# Patient Record
Sex: Male | Born: 1945 | Race: White | Hispanic: No | Marital: Married | Smoking: Former smoker
Health system: Southern US, Community
[De-identification: ages and names within clinical notes are randomized; demographics above are authoritative.]

## PROBLEM LIST (undated history)

## (undated) DIAGNOSIS — F419 Anxiety disorder, unspecified: Secondary | ICD-10-CM

## (undated) DIAGNOSIS — M1990 Unspecified osteoarthritis, unspecified site: Secondary | ICD-10-CM

## (undated) DIAGNOSIS — K219 Gastro-esophageal reflux disease without esophagitis: Secondary | ICD-10-CM

## (undated) DIAGNOSIS — E785 Hyperlipidemia, unspecified: Secondary | ICD-10-CM

## (undated) DIAGNOSIS — W19XXXA Unspecified fall, initial encounter: Secondary | ICD-10-CM

## (undated) DIAGNOSIS — I10 Essential (primary) hypertension: Secondary | ICD-10-CM

## (undated) DIAGNOSIS — R2681 Unsteadiness on feet: Secondary | ICD-10-CM

## (undated) DIAGNOSIS — G919 Hydrocephalus, unspecified: Secondary | ICD-10-CM

## (undated) DIAGNOSIS — E119 Type 2 diabetes mellitus without complications: Secondary | ICD-10-CM

## (undated) DIAGNOSIS — N400 Enlarged prostate without lower urinary tract symptoms: Secondary | ICD-10-CM

## (undated) HISTORY — PX: COLONOSCOPY: SHX174

## (undated) HISTORY — DX: Hyperlipidemia, unspecified: E78.5

## (undated) HISTORY — DX: Type 2 diabetes mellitus without complications: E11.9

## (undated) HISTORY — DX: Anxiety disorder, unspecified: F41.9

## (undated) HISTORY — DX: Benign prostatic hyperplasia without lower urinary tract symptoms: N40.0

## (undated) HISTORY — PX: OTHER SURGICAL HISTORY: SHX169

---
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
## 2004-08-03 DIAGNOSIS — E669 Obesity, unspecified: Secondary | ICD-10-CM | POA: Insufficient documentation

## 2006-12-15 ENCOUNTER — Ambulatory Visit: Payer: Self-pay | Admitting: Family Medicine

## 2007-02-25 ENCOUNTER — Ambulatory Visit: Payer: Self-pay | Admitting: Internal Medicine

## 2007-02-25 ENCOUNTER — Other Ambulatory Visit: Payer: Self-pay

## 2009-11-15 ENCOUNTER — Ambulatory Visit: Payer: Self-pay | Admitting: Internal Medicine

## 2012-06-30 ENCOUNTER — Ambulatory Visit: Payer: Self-pay | Admitting: Unknown Physician Specialty

## 2012-07-01 LAB — PATHOLOGY REPORT

## 2013-01-27 DIAGNOSIS — Z720 Tobacco use: Secondary | ICD-10-CM | POA: Insufficient documentation

## 2015-12-12 ENCOUNTER — Ambulatory Visit
Admission: EM | Admit: 2015-12-12 | Discharge: 2015-12-12 | Disposition: A | Discharge status: Home / Self Care | Payer: Medicare Other | Attending: Family Medicine | Admitting: Family Medicine

## 2015-12-12 ENCOUNTER — Encounter: Payer: Self-pay | Admitting: Emergency Medicine

## 2015-12-12 DIAGNOSIS — S30863A Insect bite (nonvenomous) of scrotum and testes, initial encounter: Secondary | ICD-10-CM

## 2015-12-12 DIAGNOSIS — W57XXXA Bitten or stung by nonvenomous insect and other nonvenomous arthropods, initial encounter: Secondary | ICD-10-CM

## 2015-12-12 DIAGNOSIS — L089 Local infection of the skin and subcutaneous tissue, unspecified: Secondary | ICD-10-CM

## 2015-12-12 DIAGNOSIS — S70362A Insect bite (nonvenomous), left thigh, initial encounter: Secondary | ICD-10-CM | POA: Diagnosis not present

## 2015-12-12 HISTORY — DX: Gastro-esophageal reflux disease without esophagitis: K21.9

## 2015-12-12 MED ORDER — MUPIROCIN 2 % EX OINT: 1.0000 "application " | TOPICAL_OINTMENT | Freq: Three times a day (TID) | CUTANEOUS | Status: DC

## 2015-12-12 MED ORDER — SULFAMETHOXAZOLE-TRIMETHOPRIM 800-160 MG PO TABS: 1.0000 | ORAL_TABLET | Freq: Two times a day (BID) | ORAL | Status: DC

## 2015-12-12 NOTE — ED Notes (Signed)
Pt presents with possible insect bite to left side groin. Pt reports redness and swelling.

## 2015-12-12 NOTE — Discharge Instructions (Signed)
Cellulitis Cellulitis is an infection of the skin and the tissue under the skin. The infected area is usually red and tender. This happens most often in the arms and lower legs. HOME CARE   Take your antibiotic medicine as told. Finish the medicine even if you start to feel better.  Keep the infected arm or leg raised (elevated).  Put a warm cloth on the area up to 4 times per day.  Only take medicines as told by your doctor.  Keep all doctor visits as told. GET HELP IF:  You see red streaks on the skin coming from the infected area.  Your red area gets bigger or turns a dark color.  Your bone or joint under the infected area is painful after the skin heals.  Your infection comes back in the same area or different area.  You have a puffy (swollen) bump in the infected area.  You have new symptoms.  You have a fever. GET HELP RIGHT AWAY IF:   You feel very sleepy.  You throw up (vomit) or have watery poop (diarrhea).  You feel sick and have muscle aches and pains.   This information is not intended to replace advice given to you by your health care provider. Make sure you discuss any questions you have with your health care provider.   Document Released: 11/20/2007 Document Revised: 02/22/2015 Document Reviewed: 08/19/2011 Elsevier Interactive Patient Education 2016 [employer], flies, fleas, bedbugs, and other insects can bite. Insect bites are different from insect stings. The bite may be red, puffy (swollen), and itchy for 2 to 4 days. Most bites get better on their own. HOME CARE   Do not scratch the bite.  Keep the bite clean and dry. Wash the bite with soap and water every day, as told by your doctor.  If directed, apply ice to the bite area.  Put ice in a plastic bag.  Place a towel between your skin and the bag.  Leave the ice on for 20 minutes, 2-3 times per day.  Follow instructions from your doctor about using medicated  lotions or creams. These can help with itching.  Apply or take over-the-counter and prescription medicines only as told by your doctor.  If you were given an antibiotic medicine, use it as told by your doctor. Do not stop using the medicine even if your condition improves.  Keep all follow-up visits as told by your doctor. This is important. GET HELP IF:  You have redness, swelling (inflammation), or pain near your bite that is getting worse.  You have a fever. GET HELP RIGHT AWAY IF:   You have joint pain.   You have fluid, blood, or pus coming from the bite area.   You have a headache.  You have neck pain.  You feel weaker than you normally do.   You have a rash.   You have chest pain.  You have shortness of breath.  You have stomach pain, feel sick to your stomach (nauseous), or throw up (vomit).  You feel more tired or sleepy than you normally do.   This information is not intended to replace advice given to you by your health care provider. Make sure you discuss any questions you have with your health care provider.   Document Released: 05/31/2000 Document Revised: 02/22/2015 Document Reviewed: 10/19/2014 Elsevier Interactive Patient Education [employer].

## 2015-12-12 NOTE — ED Provider Notes (Signed)
CSN: [id number]     Arrival date & time 12/12/15  1354 History   First MD Initiated Contact with Patient 12/12/15 [phone]   Nurses notes were reviewed.  Chief Complaint  Patient presents with  . Insect Bite   Patient states that last night he was bitten at least twice in his private area over the left side. States one was above his left testicle and was on his left thigh. He does report itching and scratching the area was a stone was not a wise idea. He denies any fever and has no idea why he was bitten like he was.   History GERD he is a former smoker and no family history pertinent to today's visit. No known drug allergies. (Consider location/radiation/quality/duration/timing/severity/associated sxs/prior Treatment) HPI  Past Medical History  Diagnosis Date  . GERD (gastroesophageal reflux disease)    History reviewed. No pertinent past surgical history. No family history on file. Social History  Substance Use Topics  . Smoking status: Former Research scientist (life sciences)  . Smokeless tobacco: None  . Alcohol Use: Yes    Review of Systems  All other systems reviewed and are negative.   Allergies  Review of patient's allergies indicates no known allergies.  Home Medications   Prior to Admission medications   Medication Sig Start Date End Date Taking? Authorizing Provider  mupirocin ointment (BACTROBAN) 2 % Apply 1 application topically 3 (three) times daily. 12/12/15   Frederich Cha, MD  sulfamethoxazole-trimethoprim (BACTRIM DS,SEPTRA DS) 800-160 MG tablet Take 1 tablet by mouth 2 (two) times daily. 12/12/15   Frederich Cha, MD   Meds Ordered and Administered this Visit  Medications - No data to display  BP 170/90 mmHg  Pulse 92  Temp(Src) 97.9 F (36.6 C) (Tympanic)  Resp 18  Ht 5\' 9"  (1.753 m)  Wt 195 lb (88.451 kg)  BMI 28.78 kg/m2  SpO2 98% No data found.   Physical Exam  Constitutional: He is oriented to person, place, and time. He appears well-developed and well-nourished.  HENT:    Head: Normocephalic and atraumatic.  Eyes: Pupils are equal, round, and reactive to light.  Musculoskeletal: Normal range of motion.  Neurological: He is alert and oriented to person, place, and time.  Skin: Rash noted. There is erythema.     Tbytes present one left upper testicle or scrotum and the other one on his left upper thigh as well. Both these are red and warm. Visit pinpoint mark in the middle the redness both sites consistent with insect bite  Psychiatric: He has a normal mood and affect.  Vitals reviewed.   ED Course  Procedures (including critical care time)  Labs Review Labs Reviewed - No data to display  Imaging Review No results found.   Visual Acuity Review  Right Eye Distance:   Left Eye Distance:   Bilateral Distance:    Right Eye Near:   Left Eye Near:    Bilateral Near:         MDM   1. Nonvenomous insect bite of testis with infection, initial encounter   2. Insect bite of thigh with local reaction, left, initial encounter    We'll place on Septra DS 1 tablet twice a day back pain ointment apply to 3 times a day follow-up the ED of his choice if this gets worse explained to him that the urgent care is not placed. scrotal abscess as if that develops during the abscess    Note: This dictation was prepared with Dragon dictation along  with smaller phrase technology. Any transcriptional errors that result from this process are unintentional.  Frederich Cha, MD 12/12/15 1535

## 2016-02-07 DIAGNOSIS — R221 Localized swelling, mass and lump, neck: Secondary | ICD-10-CM | POA: Diagnosis present

## 2016-02-07 DIAGNOSIS — E1149 Type 2 diabetes mellitus with other diabetic neurological complication: Secondary | ICD-10-CM | POA: Insufficient documentation

## 2016-02-07 DIAGNOSIS — Z87891 Personal history of nicotine dependence: Secondary | ICD-10-CM | POA: Diagnosis not present

## 2016-02-07 DIAGNOSIS — K219 Gastro-esophageal reflux disease without esophagitis: Secondary | ICD-10-CM | POA: Diagnosis not present

## 2016-02-07 NOTE — ED Triage Notes (Addendum)
Patient ambulatory to triage with steady gait, without difficulty or distress noted; pt reports sensation of swelling in throat x 2-3 days with "burning" sensation to legs and mouth dryness; denies pain or SHOB; pt st takes med for cholesterol and acid reflux

## 2016-02-08 ENCOUNTER — Emergency Department
Admission: EM | Admit: 2016-02-08 | Discharge: 2016-02-08 | Disposition: A | Discharge status: Home / Self Care | Payer: Medicare Other | Attending: Emergency Medicine | Admitting: Emergency Medicine

## 2016-02-08 ENCOUNTER — Emergency Department: Payer: Medicare Other

## 2016-02-08 DIAGNOSIS — E1149 Type 2 diabetes mellitus with other diabetic neurological complication: Secondary | ICD-10-CM

## 2016-02-08 DIAGNOSIS — K219 Gastro-esophageal reflux disease without esophagitis: Secondary | ICD-10-CM

## 2016-02-08 LAB — BASIC METABOLIC PANEL
Anion gap: 4 — ABNORMAL LOW (ref 5–15)
BUN: 12 mg/dL (ref 6–20)
CHLORIDE: 105 mmol/L (ref 101–111)
CO2: 31 mmol/L (ref 22–32)
CREATININE: 0.8 mg/dL (ref 0.61–1.24)
Calcium: 8.9 mg/dL (ref 8.9–10.3)
GFR calc Af Amer: >60 mL/min (ref >60)
GFR calc non Af Amer: >60 mL/min (ref >60)
GLUCOSE: 123 mg/dL — AB (ref 65–99)
Potassium: 3.8 mmol/L (ref 3.5–5.1)
SODIUM: 140 mmol/L (ref 135–145)

## 2016-02-08 LAB — CBC
HEMATOCRIT: 43.8 % (ref 40.0–52.0)
Hemoglobin: 15.5 g/dL (ref 13.0–18.0)
MCH: 32.6 pg (ref 26.0–34.0)
MCHC: 35.4 g/dL (ref 32.0–36.0)
MCV: 92.2 fL (ref 80.0–100.0)
Platelets: 249 10*3/uL (ref 150–440)
RBC: 4.75 MIL/uL (ref 4.40–5.90)
RDW: 12.7 % (ref 11.5–14.5)
WBC: 6.9 10*3/uL (ref 3.8–10.6)

## 2016-02-08 LAB — TROPONIN I: Troponin I: <0.03 ng/mL (ref <0.03)

## 2016-02-08 MED ORDER — GI COCKTAIL ~~LOC~~
30.0000 mL | Freq: Once | ORAL | Status: AC
  Administered 2016-02-08: 30 mL via ORAL
  Filled 2016-02-08: qty 30

## 2016-02-08 MED ORDER — SUCRALFATE 1 GM/10ML PO SUSP: 1.0000 g | ORAL | 0 refills | Status: DC

## 2016-02-08 NOTE — ED Notes (Signed)
Pt presents with sensation of swelling in throat after being a sleep for couple of hours for 2-3 days. Pt also states has "burning" sensation to legs and mouth dryness when he is asleep for couple of hours. Pt denies pain or SOB at this time. Pt is resting even breathing unlabored no distress noted. Will continue to monitor.

## 2016-02-08 NOTE — ED Provider Notes (Signed)
[facility] Emergency Department Provider Note   ____________________________________________   First MD Initiated Contact with Patient 02/08/16 [phone]     (approximate)  I have reviewed the triage vital signs and the nursing notes.   HISTORY  Chief Complaint Oral Swelling    HPI Eric [name] is a 70 y.o. male who presents to the ED from home with a chief complain of throat discomfort, dry mouth and"hot legs". Patient has a history of diabetes and GERD who reports symptoms for the past 2 weeks. States when he is awake and upright, he does not experience symptoms of throat discomfort or dry mouth. Reports sleeping on one pillow and waking up nightly approximately 3 hours after going to sleep with a sensation of burning in his throat making him feel like he can't breathe. He gets a drink of water from the kitchen which resolves his symptoms and allows him to return to bed. Also complains of his legs feeling hot for the past several weeks; describes burning sensation to his legs, especially the bottoms of his feet. Denies recent fever, chills, headache, neck pain, vision changes, chest pain, shortness of breath, abdominal pain, back pain, nausea, vomiting, diarrhea. Denies recent travel or trauma. Water makes his throat symptoms better. He makes his symptoms worse.   Past Medical History:  Diagnosis Date  . GERD (gastroesophageal reflux disease)   Diabetes mellitus Hyperlipidemia  There are no active problems to display for this patient.   No past surgical history on file.  Prior to Admission medications   Medication Sig Start Date End Date Taking? Authorizing Provider  mupirocin ointment (BACTROBAN) 2 % Apply 1 application topically 3 (three) times daily. 12/12/15   Frederich Cha, MD  sucralfate (CARAFATE) 1 GM/10ML suspension Take 10 mLs (1 g total) by mouth daily after supper. 02/08/16   Paulette Blanch, MD  sulfamethoxazole-trimethoprim (BACTRIM DS,SEPTRA DS)  800-160 MG tablet Take 1 tablet by mouth 2 (two) times daily. 12/12/15   Frederich Cha, MD  States he takes 3 daily medicines - for GERD, diabetes and hyperlipidemia  Allergies Review of patient's allergies indicates no known allergies.  No family history on file.  Social History Social History  Substance Use Topics  . Smoking status: Former Research scientist (life sciences)  . Smokeless tobacco: Not on file  . Alcohol use Yes    Review of Systems  Constitutional: No fever/chills. No unintentional weight loss. Eyes: No visual changes. ENT: Positive for throat discomfort only at night and dry mouth. No sore throat. Cardiovascular: Denies chest pain. Respiratory: Denies shortness of breath. Gastrointestinal: No abdominal pain.  No nausea, no vomiting.  No diarrhea.  No constipation. Genitourinary: Negative for dysuria. Musculoskeletal: Positive for burning sensation to legs and feet. Negative for back pain. Skin: Negative for rash. Neurological: Negative for headaches, focal weakness or numbness.  10-point ROS otherwise negative.  ____________________________________________   PHYSICAL EXAM:  VITAL SIGNS: ED Triage Vitals  Enc Vitals Group     BP 02/07/16 2321 (!) 185/84     Pulse Rate 02/07/16 2321 73     Resp 02/07/16 2321 18     Temp 02/07/16 2321 97.6 F (36.4 C)     Temp Source 02/07/16 2321 Oral     SpO2 02/07/16 2321 98 %     Weight 02/07/16 2319 185 lb (83.9 kg)     Height 02/07/16 2319 5\' 9"  (1.753 m)     Head Circumference --      Peak Flow --  Pain Score --      Pain Loc --      Pain Edu? --      Excl. in [place]? --     Constitutional: Alert and oriented. Well appearing and in no acute distress. Eyes: Conjunctivae are normal. PERRL. EOMI. Head: Atraumatic. Nose: No congestion/rhinnorhea. Mouth/Throat: Mucous membranes are moist.  Oropharynx slightly erythematous without tonsillar swelling, exudates or peritonsillar abscess. There is no hoarse or muffled voice. There is no  drooling. Neck: No stridor.  No cervical spine tenderness to palpation.  No carotid bruits. Cardiovascular: Normal rate, regular rhythm. Grossly normal heart sounds.  Good peripheral circulation. Respiratory: Normal respiratory effort.  No retractions. Lungs CTAB. Gastrointestinal: Soft and nontender. No distention. No abdominal bruits. No CVA tenderness. Musculoskeletal: No lower extremity tenderness nor edema.  No joint effusions. Neurologic:  Normal speech and language. No gross focal neurologic deficits are appreciated. No gait instability. Skin:  Skin is warm, dry and intact. No rash noted. Psychiatric: Mood and affect are normal. Speech and behavior are normal.  ____________________________________________   LABS (all labs ordered are listed, but only abnormal results are displayed)  Labs Reviewed  BASIC METABOLIC PANEL - Abnormal; Notable for the following:       Result Value   Glucose, Bld 123 (*)    Anion gap 4 (*)    All other components within normal limits  CBC  TROPONIN I   ____________________________________________  EKG  ED ECG REPORT I, Aboubacar Matsuo J, the attending physician, personally viewed and interpreted this ECG.   Date: 02/08/2016  EKG Time: 2324  Rate: 71  Rhythm: normal EKG, normal sinus rhythm  Axis: Normal  Intervals:first-degree A-V block   ST&T Change: Nonspecific  ____________________________________________  RADIOLOGY  Soft tissue neck x-rays (viewed by me, interpreted per Dr. Toney Reil): Negative. ____________________________________________   PROCEDURES  Procedure(s) performed: None  Procedures  Critical Care performed: No  ____________________________________________   INITIAL IMPRESSION / ASSESSMENT AND PLAN / ED COURSE  Pertinent labs & imaging results that were available during my care of the patient were reviewed by me and considered in my medical decision making (see chart for details).  70 year old male with a  history of GERD, diabetes and hyperlipidemia who presents with a two-week history of awakening after 3 hours of sleep at night with a sensation of throat burning associated with shortness of breath. A drink of water relieves all symptoms. The symptoms which patient describes seem consistent with reflux. Complaints of leg burning seem consistent with peripheral neuropathy from his diabetes. Patient does not recall the names of his medications. He does know that he is currently not taking Carafate. I have advised Carafate as an evening dose for relief of symptoms. I have also suggested that he elevate the head of his bed with another pillow. He is to speak with his doctor about starting gabapentin for neuropathy symptoms. Strict return precautions given. Patient and spouse verbalize understanding and agree with plan of care.  Clinical Course     ____________________________________________   FINAL CLINICAL IMPRESSION(S) / ED DIAGNOSES  Final diagnoses:  Gastroesophageal reflux disease, esophagitis presence not specified  Other diabetic neurological complication associated with type 2 diabetes mellitus (HCC)      NEW MEDICATIONS STARTED DURING THIS VISIT:  New Prescriptions   SUCRALFATE (CARAFATE) 1 GM/10ML SUSPENSION    Take 10 mLs (1 g total) by mouth daily after supper.     Note:  This document was prepared using Systems analyst and may  include unintentional dictation errors.    Paulette Blanch, MD 02/08/16 [phone]

## 2016-02-08 NOTE — ED Notes (Signed)
Discharge instructions reviewed with patient. Questions fielded by this RN. Patient verbalizes understanding of instructions. Patient discharged home in stable condition per Beather Arbour MD . No acute distress noted at time of discharge.

## 2016-02-08 NOTE — ED Notes (Signed)
Gi cocktail helped the senation in his throat.

## 2016-02-08 NOTE — Discharge Instructions (Signed)
1. Take Carafate 10 ML after supper daily. 2. Speak with your doctor about starting nerve medicine for your leg discomfort. 3. Return to the ER for worsening symptoms, persistent vomiting, difficulty breathing or other concerns.

## 2016-06-19 DIAGNOSIS — N401 Enlarged prostate with lower urinary tract symptoms: Secondary | ICD-10-CM | POA: Insufficient documentation

## 2016-09-25 NOTE — Progress Notes (Signed)
09/27/2016 9:50 AM   Eric [name] Feb 08, 1946 [id number]  Referring provider: Sofie Hartigan, MD [name], [place] [address]  Chief Complaint  Patient presents with  . New Patient (Initial Visit)    BPH with Luts referred by Dr. Ellison Hughs    HPI: Patient is a 71 year old Caucasian male with BPH with L UTS and nocturia who is referred by Dr. Ellison Hughs for further evaluation.  BPH WITH LUTS His IPSS score today is 16, which is moderte lower urinary tract symptomatology.  He is mostly satisfied with his quality life due to his urinary symptoms.  His PVR is 178 mL.  His major complaints today frequency, urgency, nocturia and weak stream.  He has had these symptoms for one year.  He denies any dysuria, hematuria or suprapubic pain.  He also denies any recent fevers, chills, nausea or vomiting.  He states he is drinking several bottles of water daily.  He is also drinking 4 diet cola's daily.       IPSS    Row Name 09/27/16 0900         International Prostate Symptom Score   How often have you had the sensation of not emptying your bladder? About half the time     How often have you had to urinate less than every two hours? About half the time     How often have you found you stopped and started again several times when you urinated? About half the time     How often have you found it difficult to postpone urination? Less than half the time     How often have you had a weak urinary stream? Less than 1 in 5 times     How often have you had to strain to start urination? Not at All     How many times did you typically get up at night to urinate? 4 Times     Total IPSS Score 16       Quality of Life due to urinary symptoms   If you were to spend the rest of your life with your urinary condition just the way it is now how would you feel about that? Mostly Disatisfied        Score:  1-7 Mild 8-19 Moderate 20-35  Severe    PMH: Past Medical History:  Diagnosis Date  . Anxiety   . 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 symptoms   . Diabetes ([place])   . GERD (gastroesophageal reflux disease)   . HLD (hyperlipidemia)     Surgical History: Past Surgical History:  Procedure Laterality Date  . TONSILLECTOMY  1962    Home Medications:  Allergies as of 09/27/2016   No Known Allergies     Medication List       Accurate as of 09/27/16  9:50 AM. Always use your most recent med list.          aspirin EC 81 MG tablet Take 81 mg by mouth.   CENTRUM SILVER ULTRA WOMENS Tabs Take by mouth.   finasteride 5 MG tablet Commonly known as:  PROSCAR Take 1 tablet (5 mg total) by mouth daily.   lisinopril 5 MG tablet Commonly known as:  PRINIVIL,ZESTRIL Take by mouth.   lovastatin 20 MG tablet Commonly known as:  MEVACOR Take 20 mg by mouth.   metFORMIN 500 MG tablet Commonly known as:  GLUCOPHAGE TAKE 1 TABLET BY MOUTH EVERY DAY  mupirocin ointment 2 % Commonly known as:  BACTROBAN Apply 1 application topically 3 (three) times daily.   omeprazole 20 MG capsule Commonly known as:  PRILOSEC Take 20 mg by mouth.   ranitidine 150 MG tablet Commonly known as:  ZANTAC Take 150 mg by mouth.   sucralfate 1 GM/10ML suspension Commonly known as:  CARAFATE Take 10 mLs (1 g total) by mouth daily after supper.   sulfamethoxazole-trimethoprim 800-160 MG tablet Commonly known as:  BACTRIM DS,SEPTRA DS Take 1 tablet by mouth 2 (two) times daily.   triazolam 0.125 MG tablet Commonly known as:  HALCION Take by mouth.   zolpidem 5 MG tablet Commonly known as:  AMBIEN Take 5 mg by mouth.       Allergies: No Known Allergies  Family History: Family History  Problem Relation Age of Onset  . Prostate cancer Neg Hx   . Kidney cancer Neg Hx   . Bladder Cancer Neg Hx     Social History:  reports that he has quit smoking. He has never used smokeless tobacco. He reports that he  does not drink alcohol or use drugs.  ROS: UROLOGY Frequent Urination?: Yes Hard to postpone urination?: Yes Burning/pain with urination?: No Get up at night to urinate?: Yes Leakage of urine?: No Urine stream starts and stops?: No Trouble starting stream?: No Do you have to strain to urinate?: No Blood in urine?: No Urinary tract infection?: No Sexually transmitted disease?: No Injury to kidneys or bladder?: No Painful intercourse?: No Weak stream?: Yes Erection problems?: Yes Penile pain?: No  Gastrointestinal Nausea?: No Vomiting?: No Indigestion/heartburn?: Yes Diarrhea?: No Constipation?: Yes  Constitutional Fever: No Night sweats?: No Weight loss?: No Fatigue?: No  Skin Skin rash/lesions?: No Itching?: No  Eyes Blurred vision?: No Double vision?: No  Ears/Nose/Throat Sore throat?: No Sinus problems?: No  Hematologic/Lymphatic Swollen glands?: No Easy bruising?: Yes  Cardiovascular Leg swelling?: No Chest pain?: No  Respiratory Cough?: No Shortness of breath?: No  Endocrine Excessive thirst?: Yes  Musculoskeletal Back pain?: No Joint pain?: Yes  Neurological Headaches?: No Dizziness?: No  Psychologic Depression?: No Anxiety?: No  Physical Exam: BP 140/78   Pulse 75   Ht 5\' 8"  (1.727 m)   Wt 188 lb (85.3 kg)   BMI 28.59 kg/m   Constitutional: Well nourished. Alert and oriented, No acute distress. HEENT: [place] AT, moist mucus membranes. Trachea midline, no masses. Cardiovascular: No clubbing, cyanosis, or edema. Respiratory: Normal respiratory effort, no increased work of breathing. GI: Abdomen is soft, non tender, non distended, no abdominal masses. Liver and spleen not palpable.  No hernias appreciated.  Stool sample for occult testing is not indicated.   GU: No CVA tenderness.  No bladder fullness or masses.  Patient with uncircumcised phallus. Foreskin easily retracted  Urethral meatus is patent.  No penile discharge. No  penile lesions or rashes. Scrotum without lesions, cysts, rashes and/or edema.  Testicles are located scrotally bilaterally. No masses are appreciated in the testicles. Left and right epididymis are normal. Rectal: Patient with  normal sphincter tone. Anus and perineum without scarring or rashes. No rectal masses are appreciated. Prostate is approximately 45 grams, no nodules are appreciated. Seminal vesicles are normal. Skin: No rashes, bruises or suspicious lesions. Lymph: No cervical or inguinal adenopathy. Neurologic: Grossly intact, no focal deficits, moving all 4 extremities. Psychiatric: Normal mood and affect.  Laboratory Data: PSA  2.64 ng/mL on 06/20/2016  Lab Results  Component Value Date   WBC 6.9 02/07/2016  HGB 15.5 02/07/2016   HCT 43.8 02/07/2016   MCV 92.2 02/07/2016   PLT 249 02/07/2016    Lab Results  Component Value Date   CREATININE 0.80 02/07/2016     Pertinent Imaging: Results for SALMAAN, PATCHIN (MRN [id number]) as of 09/27/2016 09:40  Ref. Range 09/27/2016 09:05  Scan Result Unknown 178    Assessment & Plan:    1. BPH with LUTS  - IPSS score is 16/4  - Continue conservative management, avoiding bladder irritants and timed voiding's  - most bothersome symptoms is/are frequency and nocturia  - start finasteride 5 mg daily  - RTC in one month for I PSS and PVR  2. Nocturia  - I explained to the patient that nocturia is often multi-factorial and difficult to treat.  Sleeping disorders, heart conditions, peripheral vascular disease, diabetes, an enlarged prostate for men, an urethral stricture causing bladder outlet obstruction and/or certain medications can contribute to nocturia.  - I have suggested that the patient avoid caffeine after noon and alcohol in the evening.  He or she may also benefit from fluid restrictions after 6:00 in the evening and voiding just prior to bedtime.  - I have explained that research studies have showed that over 84% of  patients with sleep apnea reported frequent nighttime urination.   With sleep apnea, oxygen decreases, carbon dioxide increases, the blood become more acidic, the heart rate drops and blood vessels in the lung constrict.  The body is then alerted that something is very wrong. The sleeper must wake enough to reopen the airway. By this time, the heart is racing and experiences a false signal of fluid overload. The heart excretes a hormone-like protein that tells the body to get rid of sodium and water, resulting in nocturia.  -  I also informed the patient that a recent study noted that decreasing sodium intake to 2.3 grams daily, if they don't have issues with hyponatremia, can also reduce the number of nightly voids  - The patient may benefit from a discussion with his or her primary care physician to see if he or she has risk factors for sleep apnea or other sleep disturbances and obtaining a sleep study.  3. Frequency  - encouraged patient to reduce or completely eliminate cola's  - continue good control of diabetes   Return in about 1 month (around 10/27/2016) for IPSS and PVR.  These notes generated with voice recognition software. I apologize for typographical errors.  Zara Council, [facility] [address], [place], [place] [address] [phone]

## 2016-09-27 ENCOUNTER — Ambulatory Visit (INDEPENDENT_AMBULATORY_CARE_PROVIDER_SITE_OTHER): Payer: Medicare Other | Admitting: Urology

## 2016-09-27 ENCOUNTER — Encounter: Payer: Self-pay | Admitting: Urology

## 2016-09-27 VITALS — BP 140/78 | HR 75 | Ht 68.0 in | Wt 188.0 lb

## 2016-09-27 DIAGNOSIS — R350 Frequency of micturition: Secondary | ICD-10-CM

## 2016-09-27 DIAGNOSIS — N401 Enlarged prostate with lower urinary tract symptoms: Secondary | ICD-10-CM

## 2016-09-27 DIAGNOSIS — R351 Nocturia: Secondary | ICD-10-CM | POA: Diagnosis not present

## 2016-09-27 LAB — BLADDER SCAN AMB NON-IMAGING: SCAN RESULT: 178

## 2016-09-27 MED ORDER — FINASTERIDE 5 MG PO TABS: 5.0000 mg | ORAL_TABLET | Freq: Every day | ORAL | 3 refills | Status: DC

## 2016-10-29 NOTE — Progress Notes (Signed)
11/01/2016 9:17 AM   Eric [name] May 25, 1946 [id number]  Referring provider: No referring provider defined for this encounter.  Chief Complaint  Patient presents with  . Benign Prostatic Hypertrophy    1 month follow up   . Nocturia    HPI: Patient is a 71 year old Caucasian male with BPH with L UTS and nocturia who presents today for a one month follow up.    BPH WITH LUTS His IPSS score today is 14, which is moderate lower urinary tract symptomatology.  He is mixed with his quality life due to his urinary symptoms.  His PVR is 268 mL.  His previous PVR was 178 mL.  His major complaints today frequency, urgency, nocturia and weak stream.  He has had these symptoms for one year.  He denies any dysuria, hematuria or suprapubic pain.  He also denies any recent fevers, chills, nausea or vomiting.  He states he is drinking several bottles of water daily.  He is also drinking 4 diet cola's daily.  He was started on finasteride 5 mg daily and reducing Coca Cola's at his last visit one month ago.   He has not reduced his cola's.       IPSS    Row Name 09/27/16 0900 11/01/16 0900       International Prostate Symptom Score   How often have you had the sensation of not emptying your bladder? About half the time Less than 1 in 5    How often have you had to urinate less than every two hours? About half the time Less than 1 in 5 times    How often have you found you stopped and started again several times when you urinated? About half the time Less than half the time    How often have you found it difficult to postpone urination? Less than half the time Less than half the time    How often have you had a weak urinary stream? Less than 1 in 5 times About half the time    How often have you had to strain to start urination? Not at All Less than 1 in 5 times    How many times did you typically get up at night to urinate? 4 Times 4 Times    Total IPSS Score 16 14      Quality of Life due to  urinary symptoms   If you were to spend the rest of your life with your urinary condition just the way it is now how would you feel about that? Mostly Disatisfied Mixed       Score:  1-7 Mild 8-19 Moderate 20-35 Severe    PMH: Past Medical History:  Diagnosis Date  . Anxiety   . 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 symptoms   . Diabetes ([place])   . GERD (gastroesophageal reflux disease)   . HLD (hyperlipidemia)     Surgical History: Past Surgical History:  Procedure Laterality Date  . TONSILLECTOMY  1962    Home Medications:  Allergies as of 11/01/2016   No Known Allergies     Medication List       Accurate as of 11/01/16  9:17 AM. Always use your most recent med list.          aspirin EC 81 MG tablet Take 81 mg by mouth.   CENTRUM SILVER ULTRA WOMENS Tabs Take by mouth.   finasteride 5 MG tablet Commonly known as:  PROSCAR Take 1 tablet (5 mg  total) by mouth daily.   lisinopril 5 MG tablet Commonly known as:  PRINIVIL,ZESTRIL Take by mouth.   lovastatin 20 MG tablet Commonly known as:  MEVACOR Take 20 mg by mouth.   metFORMIN 500 MG tablet Commonly known as:  GLUCOPHAGE TAKE 1 TABLET BY MOUTH EVERY DAY   mupirocin ointment 2 % Commonly known as:  BACTROBAN Apply 1 application topically 3 (three) times daily.   omeprazole 20 MG capsule Commonly known as:  PRILOSEC Take 20 mg by mouth.   ranitidine 150 MG tablet Commonly known as:  ZANTAC Take 150 mg by mouth.   sucralfate 1 GM/10ML suspension Commonly known as:  CARAFATE Take 10 mLs (1 g total) by mouth daily after supper.   sulfamethoxazole-trimethoprim 800-160 MG tablet Commonly known as:  BACTRIM DS,SEPTRA DS Take 1 tablet by mouth 2 (two) times daily.   tamsulosin 0.4 MG Caps capsule Commonly known as:  FLOMAX Take 1 capsule (0.4 mg total) by mouth daily.   triazolam 0.125 MG tablet Commonly known as:  HALCION Take by mouth.   zolpidem 5 MG tablet Commonly known as:   AMBIEN Take 5 mg by mouth.       Allergies: No Known Allergies  Family History: Family History  Problem Relation Age of Onset  . Prostate cancer Neg Hx   . Kidney cancer Neg Hx   . Bladder Cancer Neg Hx     Social History:  reports that he has quit smoking. He has never used smokeless tobacco. He reports that he does not drink alcohol or use drugs.  ROS: UROLOGY Frequent Urination?: Yes Hard to postpone urination?: No Burning/pain with urination?: No Get up at night to urinate?: Yes Leakage of urine?: No Urine stream starts and stops?: No Trouble starting stream?: No Do you have to strain to urinate?: No Blood in urine?: No Urinary tract infection?: No Sexually transmitted disease?: No Injury to kidneys or bladder?: No Painful intercourse?: No Weak stream?: No Erection problems?: No Penile pain?: No  Gastrointestinal Nausea?: No Vomiting?: No Indigestion/heartburn?: No Diarrhea?: No Constipation?: No  Constitutional Fever: No Night sweats?: No Weight loss?: No Fatigue?: No  Skin Skin rash/lesions?: No Itching?: No  Eyes Blurred vision?: No Double vision?: No  Ears/Nose/Throat Sore throat?: No Sinus problems?: No  Hematologic/Lymphatic Swollen glands?: No Easy bruising?: No  Cardiovascular Leg swelling?: No Chest pain?: No  Respiratory Cough?: No Shortness of breath?: No  Endocrine Excessive thirst?: No  Musculoskeletal Back pain?: No Joint pain?: No  Neurological Headaches?: No Dizziness?: No  Psychologic Depression?: No Anxiety?: No  Physical Exam: BP (!) 148/87   Pulse 81   Ht 5\' 8"  (1.727 m)   Wt 189 lb (85.7 kg)   BMI 28.74 kg/m   Constitutional: Well nourished. Alert and oriented, No acute distress. HEENT:  AT, moist mucus membranes. Trachea midline, no masses. Cardiovascular: No clubbing, cyanosis, or edema. Respiratory: Normal respiratory effort, no increased work of breathing. Skin: No rashes, bruises or  suspicious lesions. Lymph: No cervical or inguinal adenopathy. Neurologic: Grossly intact, no focal deficits, moving all 4 extremities. Psychiatric: Normal mood and affect.  Laboratory Data: PSA  2.64 ng/mL on 06/20/2016  Lab Results  Component Value Date   WBC 6.9 02/07/2016   HGB 15.5 02/07/2016   HCT 43.8 02/07/2016   MCV 92.2 02/07/2016   PLT 249 02/07/2016    Lab Results  Component Value Date   CREATININE 0.80 02/07/2016     Pertinent Imaging: Results for DATON, SZILAGYI E (  MRN [id number]) as of 11/01/2016 09:09  Ref. Range 11/01/2016 09:07  Scan Result Unknown 268     Assessment & Plan:    1. BPH with LUTS  - IPSS score is 16/4, it is slightly improved  - Continue conservative management, avoiding bladder irritants and timed voiding's  - most bothersome symptoms is/are frequency and nocturia  - continue finasteride 5 mg daily  - start tamsulosin 0.4 mg daily; side effects discussed  - RTC in three month for I PSS and PVR  2. Nocturia  - Patient has not had a sleep study  - Encouraged to speak with his PCP to be evaluated for sleep apnea  3. Frequency  - encouraged patient to reduce or completely eliminate cola's  - continue good control of diabetes   Return in about 3 months (around 02/01/2017) for IPSS and PVR.  These notes generated with voice recognition software. I apologize for typographical errors.  Zara Council, [facility] [address] [place], [place] [address] [phone]

## 2016-11-01 ENCOUNTER — Encounter: Payer: Self-pay | Admitting: Urology

## 2016-11-01 ENCOUNTER — Ambulatory Visit (INDEPENDENT_AMBULATORY_CARE_PROVIDER_SITE_OTHER): Payer: Medicare Other | Admitting: Urology

## 2016-11-01 VITALS — BP 148/87 | HR 81 | Ht 68.0 in | Wt 189.0 lb

## 2016-11-01 DIAGNOSIS — N138 Other obstructive and reflux uropathy: Secondary | ICD-10-CM

## 2016-11-01 DIAGNOSIS — N401 Enlarged prostate with lower urinary tract symptoms: Secondary | ICD-10-CM | POA: Diagnosis not present

## 2016-11-01 DIAGNOSIS — R351 Nocturia: Secondary | ICD-10-CM

## 2016-11-01 DIAGNOSIS — R350 Frequency of micturition: Secondary | ICD-10-CM | POA: Diagnosis not present

## 2016-11-01 DIAGNOSIS — N400 Enlarged prostate without lower urinary tract symptoms: Secondary | ICD-10-CM | POA: Diagnosis not present

## 2016-11-01 LAB — BLADDER SCAN AMB NON-IMAGING: Scan Result: 268

## 2016-11-01 MED ORDER — FINASTERIDE 5 MG PO TABS: 5.0000 mg | ORAL_TABLET | Freq: Every day | ORAL | 3 refills | Status: AC

## 2016-11-01 MED ORDER — TAMSULOSIN HCL 0.4 MG PO CAPS: 0.4000 mg | ORAL_CAPSULE | Freq: Every day | ORAL | 3 refills | Status: AC

## 2017-01-31 ENCOUNTER — Ambulatory Visit: Payer: Medicare Other | Admitting: Urology

## 2017-05-13 ENCOUNTER — Ambulatory Visit
Admission: EM | Admit: 2017-05-13 | Discharge: 2017-05-13 | Disposition: A | Discharge status: Hospice | Payer: Medicare Other | Attending: Family Medicine | Admitting: Family Medicine

## 2017-05-13 ENCOUNTER — Encounter: Payer: Self-pay | Admitting: *Deleted

## 2017-05-13 DIAGNOSIS — T23202A Burn of second degree of left hand, unspecified site, initial encounter: Secondary | ICD-10-CM

## 2017-05-13 DIAGNOSIS — Z23 Encounter for immunization: Secondary | ICD-10-CM

## 2017-05-13 DIAGNOSIS — T2010XA Burn of first degree of head, face, and neck, unspecified site, initial encounter: Secondary | ICD-10-CM

## 2017-05-13 DIAGNOSIS — X030XXA Exposure to flames in controlled fire, not in building or structure, initial encounter: Secondary | ICD-10-CM | POA: Diagnosis not present

## 2017-05-13 DIAGNOSIS — T23271A Burn of second degree of right wrist, initial encounter: Secondary | ICD-10-CM | POA: Diagnosis not present

## 2017-05-13 DIAGNOSIS — T23201A Burn of second degree of right hand, unspecified site, initial encounter: Secondary | ICD-10-CM | POA: Diagnosis not present

## 2017-05-13 MED ORDER — IBUPROFEN 800 MG PO TABS
800.0000 mg | ORAL_TABLET | Freq: Once | ORAL | Status: AC
  Administered 2017-05-13: 800 mg via ORAL

## 2017-05-13 MED ORDER — TETANUS-DIPHTH-ACELL PERTUSSIS 5-2.5-18.5 LF-MCG/0.5 IM SUSP
0.5000 mL | Freq: Once | INTRAMUSCULAR | Status: AC
  Administered 2017-05-13: 0.5 mL via INTRAMUSCULAR
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
## 2017-09-12 ENCOUNTER — Emergency Department
Admission: EM | Admit: 2017-09-12 | Discharge: 2017-09-12 | Disposition: A | Discharge status: Home / Self Care | Payer: Medicare Other | Attending: Emergency Medicine | Admitting: Emergency Medicine

## 2017-09-12 ENCOUNTER — Emergency Department: Payer: Medicare Other

## 2017-09-12 ENCOUNTER — Encounter: Payer: Self-pay | Admitting: Emergency Medicine

## 2017-09-12 DIAGNOSIS — Y939 Activity, unspecified: Secondary | ICD-10-CM | POA: Diagnosis not present

## 2017-09-12 DIAGNOSIS — M545 Low back pain, unspecified: Secondary | ICD-10-CM

## 2017-09-12 DIAGNOSIS — Z87891 Personal history of nicotine dependence: Secondary | ICD-10-CM | POA: Insufficient documentation

## 2017-09-12 DIAGNOSIS — Y999 Unspecified external cause status: Secondary | ICD-10-CM | POA: Diagnosis not present

## 2017-09-12 DIAGNOSIS — Y929 Unspecified place or not applicable: Secondary | ICD-10-CM | POA: Diagnosis not present

## 2017-09-12 DIAGNOSIS — Z79899 Other long term (current) drug therapy: Secondary | ICD-10-CM | POA: Insufficient documentation

## 2017-09-12 DIAGNOSIS — E119 Type 2 diabetes mellitus without complications: Secondary | ICD-10-CM | POA: Insufficient documentation

## 2017-09-12 DIAGNOSIS — Z7984 Long term (current) use of oral hypoglycemic drugs: Secondary | ICD-10-CM | POA: Insufficient documentation

## 2017-09-12 LAB — URINALYSIS, COMPLETE (UACMP) WITH MICROSCOPIC
Bacteria, UA: NONE SEEN
Bilirubin Urine: NEGATIVE
Glucose, UA: NEGATIVE mg/dL
Ketones, ur: NEGATIVE mg/dL
Leukocytes, UA: NEGATIVE
Nitrite: NEGATIVE
Protein, ur: NEGATIVE mg/dL
Specific Gravity, Urine: 1.014 (ref 1.005–1.030)
Squamous Epithelial / HPF: NONE SEEN
pH: 5 (ref 5.0–8.0)

## 2017-09-12 MED ORDER — TRAMADOL HCL 50 MG PO TABS: 50.0000 mg | ORAL_TABLET | Freq: Two times a day (BID) | ORAL | 0 refills | Status: AC | PRN

## 2017-09-12 MED ORDER — TRAMADOL HCL 50 MG PO TABS
50.0000 mg | ORAL_TABLET | Freq: Once | ORAL | Status: AC
  Administered 2017-09-12: 50 mg via ORAL
  Filled 2017-09-12: qty 1

## 2017-09-12 NOTE — ED Notes (Signed)
Pt ambulatory to room with steady gait

## 2017-09-12 NOTE — ED Notes (Signed)
Patient transported to XR. 
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
## 2017-09-12 NOTE — ED Notes (Signed)
Pt ambulatory upon discharge. Verbalized understanding of discharge instructions, follow-up care and prescription. BP high, but pt states "I haven't taken my nighttime BP medication yet." Pt states he will go home and take it before bed. Skin warm and dry. A&O x4.

## 2017-11-26 ENCOUNTER — Other Ambulatory Visit: Payer: Self-pay

## 2017-11-26 ENCOUNTER — Emergency Department
Admission: EM | Admit: 2017-11-26 | Discharge: 2017-11-26 | Disposition: A | Discharge status: Home / Self Care | Payer: Medicare Other | Attending: Emergency Medicine | Admitting: Emergency Medicine

## 2017-11-26 DIAGNOSIS — Z79899 Other long term (current) drug therapy: Secondary | ICD-10-CM | POA: Insufficient documentation

## 2017-11-26 DIAGNOSIS — S20369A Insect bite (nonvenomous) of unspecified front wall of thorax, initial encounter: Secondary | ICD-10-CM | POA: Diagnosis present

## 2017-11-26 DIAGNOSIS — Z87891 Personal history of nicotine dependence: Secondary | ICD-10-CM | POA: Insufficient documentation

## 2017-11-26 DIAGNOSIS — Y999 Unspecified external cause status: Secondary | ICD-10-CM | POA: Insufficient documentation

## 2017-11-26 DIAGNOSIS — E119 Type 2 diabetes mellitus without complications: Secondary | ICD-10-CM | POA: Diagnosis not present

## 2017-11-26 DIAGNOSIS — Y929 Unspecified place or not applicable: Secondary | ICD-10-CM | POA: Insufficient documentation

## 2017-11-26 DIAGNOSIS — W57XXXA Bitten or stung by nonvenomous insect and other nonvenomous arthropods, initial encounter: Secondary | ICD-10-CM | POA: Diagnosis not present

## 2017-11-26 DIAGNOSIS — Y939 Activity, unspecified: Secondary | ICD-10-CM | POA: Diagnosis not present

## 2017-11-26 MED ORDER — TRIAMCINOLONE ACETONIDE 0.5 % EX OINT: 1.0000 "application " | TOPICAL_OINTMENT | Freq: Two times a day (BID) | CUTANEOUS | 0 refills | Status: AC

## 2017-11-26 NOTE — ED Notes (Signed)
Pt ambulatory upon discharge; declined wheel chair. Verbalized understanding of discharge instructions, follow-up care and prescription. VSS. Skin warm and dry. A&O x4.  

## 2017-11-26 NOTE — ED Triage Notes (Signed)
Pt to ER for tick bite. States found tick on chest yesterday, removed tick. No complaints ,"just thought I out to get checked out". Pt alert and oriented X4, active, cooperative, pt in NAD. RR even and unlabored, color WNL.

## 2017-11-26 NOTE — ED Provider Notes (Signed)
[facility] Emergency Department Provider Note  ____________________________________________  Time seen: Approximately 6:17 PM  I have reviewed the triage vital signs and the nursing notes.   HISTORY  Chief Complaint Insect Bite   HPI Eric [name] is a 72 y.o. male who presents to the emergency department for evaluation and treatment after pulling a tick of his chest yesterday.  He states that he was able to remove the entire tick, but someone had told him he needed to come in to get some antibiotics.  Patient denies any fever or other symptoms of concern.  The area is not painful and he has not noticed any drainage from the site.  He does not have any significant history of skin infection.  No alleviating measures were attempted for this complaint prior to arrival.   Past Medical History:  Diagnosis Date  . Anxiety   . 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 symptoms   . Diabetes ([place])   . GERD (gastroesophageal reflux disease)   . HLD (hyperlipidemia)     There are no active problems to display for this patient.   Past Surgical History:  Procedure Laterality Date  . TONSILLECTOMY  1962    Prior to Admission medications   Medication Sig Start Date End Date Taking? Authorizing Provider  finasteride (PROSCAR) 5 MG tablet Take 1 tablet (5 mg total) by mouth daily. 11/01/16   Zara Council A, PA-C  lisinopril (PRINIVIL,ZESTRIL) 5 MG tablet Take by mouth. 06/25/16 06/25/17  [provider]  lovastatin (MEVACOR) 20 MG tablet Take 20 mg by mouth. 01/30/16   [provider]  metFORMIN (GLUCOPHAGE) 500 MG tablet TAKE 1 TABLET BY MOUTH EVERY DAY 04/24/16   [provider]  Multiple Vitamins-Minerals (CENTRUM SILVER ULTRA WOMENS) TABS Take by mouth. 07/06/07   [provider]  mupirocin ointment (BACTROBAN) 2 % Apply 1 application topically 3 (three) times daily. Patient not taking: Reported on 09/27/2016 12/12/15   Frederich Cha, MD  omeprazole (PRILOSEC) 20 MG capsule Take 20 mg by mouth. 01/30/16   [provider]  ranitidine (ZANTAC) 150 MG tablet Take 150 mg by mouth.    [provider]  sucralfate (CARAFATE) 1 GM/10ML suspension Take 10 mLs (1 g total) by mouth daily after supper. Patient not taking: Reported on 09/27/2016 02/08/16   Paulette Blanch, MD  sulfamethoxazole-trimethoprim (BACTRIM DS,SEPTRA DS) 800-160 MG tablet Take 1 tablet by mouth 2 (two) times daily. Patient not taking: Reported on 09/27/2016 12/12/15   Frederich Cha, MD  tamsulosin (FLOMAX) 0.4 MG CAPS capsule Take 1 capsule (0.4 mg total) by mouth daily. 11/01/16   Zara Council A, PA-C  triamcinolone ointment (KENALOG) 0.5 % Apply 1 application topically 2 (two) times daily for 5 days. 11/26/17 12/01/17  Victorino Dike, FNP  triazolam (HALCION) 0.125 MG tablet Take by mouth. 05/06/16   [provider]  zolpidem (AMBIEN) 5 MG tablet Take 5 mg by mouth. 01/30/16   [provider]    Allergies Patient has no known allergies.  Family History  Problem Relation Age of Onset  . Prostate cancer Neg Hx   . Kidney cancer Neg Hx   . Bladder Cancer Neg Hx     Social History Social History   Tobacco Use  . Smoking status: Former Research scientist (life sciences)  . Smokeless tobacco: Never Used  . Tobacco comment: quit 10 years   Substance Use Topics  . Alcohol use: No  . Drug use: No    Review  of Systems  Constitutional: Negative for fever. Respiratory: Negative for cough or shortness of breath.  Musculoskeletal: Negative for myalgias Skin: Positive for irritation at site of tick removal and chest wall Neurological: Negative for numbness or paresthesias. ____________________________________________   PHYSICAL EXAM:  VITAL SIGNS: ED Triage Vitals  Enc Vitals Group     BP 11/26/17 1803 (!) 146/84     Pulse Rate 11/26/17 1803 96     Resp 11/26/17 1803 20     Temp 11/26/17 1803 97.7 F (36.5 C)     Temp Source 11/26/17  1803 Oral     SpO2 11/26/17 1803 97 %     Weight 11/26/17 1803 185 lb (83.9 kg)     Height 11/26/17 1803 5\' 8"  (1.727 m)     Head Circumference --      Peak Flow --      Pain Score 11/26/17 1808 0     Pain Loc --      Pain Edu? --      Excl. in [place]? --      Constitutional: Well appearing. Eyes: Conjunctivae are clear without discharge or drainage. Nose: No rhinorrhea noted. Mouth/Throat: Airway is patent.  Neck: No stridor. Unrestricted range of motion observed. Cardiovascular: Capillary refill is <3 seconds.  Respiratory: Respirations are even and unlabored.. Musculoskeletal: Unrestricted range of motion observed. Neurologic: Awake, alert, and oriented x 4.  Skin: Small inflamed area with a central area of clearing at the site of tick removal on the left upper chest wall.  There is no surrounding erythema.  There is no fluctuance or induration.  ____________________________________________   LABS (all labs ordered are listed, but only abnormal results are displayed)  Labs Reviewed - No data to display ____________________________________________  EKG  Not indicated. ____________________________________________  RADIOLOGY  Not indicated ____________________________________________   PROCEDURES  Procedures ____________________________________________   INITIAL IMPRESSION / ASSESSMENT AND PLAN / ED COURSE  Eric [name] is a 72 y.o. male who presents to the emergency department for evaluation after pulling a tick off himself yesterday.  No indication of need for antibiotics.  Reassurance was provided to the patient.  He did request some type of cream to apply to the area and triamcinolone prescription was written.  He is to follow-up with his primary care provider for any symptom of concern or return to the emergency department if he is unable to schedule appointment.   Medications - No data to display   Pertinent labs & imaging results that were available during  my care of the patient were reviewed by me and considered in my medical decision making (see chart for details).  ____________________________________________   FINAL CLINICAL IMPRESSION(S) / ED DIAGNOSES  Final diagnoses:  Tick bite, initial encounter    ED Discharge Orders        Ordered    triamcinolone ointment (KENALOG) 0.5 %  2 times daily     11/26/17 1846       Note:  This document was prepared using Dragon voice recognition software and may include unintentional dictation errors.    Victorino Dike, FNP 11/26/17 8502    Arta Silence, MD 11/27/17 0009

## 2017-11-26 NOTE — ED Notes (Signed)
Pt reports being bit by a tick yesterday and removing it himself. Pt now has increased redness to the area that has spread across his chest. Pt is concerned with infection.

## 2018-01-10 ENCOUNTER — Emergency Department
Admission: EM | Admit: 2018-01-10 | Discharge: 2018-01-10 | Disposition: A | Discharge status: Home / Self Care | Payer: Medicare Other | Attending: Emergency Medicine | Admitting: Emergency Medicine

## 2018-01-10 ENCOUNTER — Encounter: Payer: Self-pay | Admitting: Emergency Medicine

## 2018-01-10 ENCOUNTER — Other Ambulatory Visit: Payer: Self-pay

## 2018-01-10 DIAGNOSIS — Z7984 Long term (current) use of oral hypoglycemic drugs: Secondary | ICD-10-CM | POA: Insufficient documentation

## 2018-01-10 DIAGNOSIS — L089 Local infection of the skin and subcutaneous tissue, unspecified: Secondary | ICD-10-CM

## 2018-01-10 DIAGNOSIS — E119 Type 2 diabetes mellitus without complications: Secondary | ICD-10-CM | POA: Insufficient documentation

## 2018-01-10 DIAGNOSIS — Z79899 Other long term (current) drug therapy: Secondary | ICD-10-CM | POA: Diagnosis not present

## 2018-01-10 DIAGNOSIS — T8140XA Infection following a procedure, unspecified, initial encounter: Secondary | ICD-10-CM | POA: Diagnosis not present

## 2018-01-10 DIAGNOSIS — Y829 Unspecified medical devices associated with adverse incidents: Secondary | ICD-10-CM | POA: Diagnosis not present

## 2018-01-10 DIAGNOSIS — F419 Anxiety disorder, unspecified: Secondary | ICD-10-CM | POA: Insufficient documentation

## 2018-01-10 DIAGNOSIS — Z87891 Personal history of nicotine dependence: Secondary | ICD-10-CM | POA: Diagnosis not present

## 2018-01-10 DIAGNOSIS — T148XXA Other injury of unspecified body region, initial encounter: Secondary | ICD-10-CM

## 2018-01-10 DIAGNOSIS — T8189XA Other complications of procedures, not elsewhere classified, initial encounter: Secondary | ICD-10-CM | POA: Diagnosis present

## 2018-01-10 LAB — CBC WITH DIFFERENTIAL/PLATELET
Basophils Absolute: 0 10*3/uL (ref 0–0.1)
Basophils Relative: 0 %
Eosinophils Absolute: 0.2 10*3/uL (ref 0–0.7)
Eosinophils Relative: 2 %
HEMATOCRIT: 34 % — AB (ref 40.0–52.0)
Hemoglobin: 12.4 g/dL — ABNORMAL LOW (ref 13.0–18.0)
LYMPHS ABS: 1.5 10*3/uL (ref 1.0–3.6)
LYMPHS PCT: 15 %
MCH: 33.9 pg (ref 26.0–34.0)
MCHC: 36.5 g/dL — ABNORMAL HIGH (ref 32.0–36.0)
MCV: 92.7 fL (ref 80.0–100.0)
MONO ABS: 0.9 10*3/uL (ref 0.2–1.0)
Monocytes Relative: 9 %
NEUTROS ABS: 7.4 10*3/uL — AB (ref 1.4–6.5)
Neutrophils Relative %: 74 %
Platelets: 334 10*3/uL (ref 150–440)
RBC: 3.67 MIL/uL — ABNORMAL LOW (ref 4.40–5.90)
RDW: 13.1 % (ref 11.5–14.5)
WBC: 10.1 10*3/uL (ref 3.8–10.6)

## 2018-01-10 LAB — COMPREHENSIVE METABOLIC PANEL
ALT: 27 U/L (ref 0–44)
ANION GAP: 6 (ref 5–15)
AST: 37 U/L (ref 15–41)
Albumin: 3.9 g/dL (ref 3.5–5.0)
Alkaline Phosphatase: 38 U/L (ref 38–126)
BUN: 16 mg/dL (ref 8–23)
CHLORIDE: 106 mmol/L (ref 98–111)
CO2: 28 mmol/L (ref 22–32)
Calcium: 8.8 mg/dL — ABNORMAL LOW (ref 8.9–10.3)
Creatinine, Ser: 0.67 mg/dL (ref 0.61–1.24)
GFR calc Af Amer: >60 mL/min (ref >60)
GFR calc non Af Amer: >60 mL/min (ref >60)
GLUCOSE: 170 mg/dL — AB (ref 70–99)
POTASSIUM: 4 mmol/L (ref 3.5–5.1)
SODIUM: 140 mmol/L (ref 135–145)
TOTAL PROTEIN: 6.6 g/dL (ref 6.5–8.1)
Total Bilirubin: 0.9 mg/dL (ref 0.3–1.2)

## 2018-01-10 MED ORDER — VANCOMYCIN HCL IN DEXTROSE 1-5 GM/200ML-% IV SOLN
1000.0000 mg | Freq: Once | INTRAVENOUS | Status: AC
  Administered 2018-01-10: 1000 mg via INTRAVENOUS
  Filled 2018-01-10: qty 200

## 2018-01-10 MED ORDER — PIPERACILLIN-TAZOBACTAM 3.375 G IVPB 30 MIN
3.3750 g | Freq: Once | INTRAVENOUS | Status: AC
  Administered 2018-01-10: 3.375 g via INTRAVENOUS
  Filled 2018-01-10: qty 50

## 2018-01-10 NOTE — ED Notes (Signed)
Pt found walking in hallway. Informed he needed to return to room.

## 2018-01-10 NOTE — ED Notes (Signed)
Pt out of room approx every 10 minutes asking how much longer before he sees md. Pt updated repeatedly.

## 2018-01-10 NOTE — ED Notes (Signed)
Pt un screwed the IV lines and vanc not given fully. EDP aware and at bedside right now.

## 2018-01-10 NOTE — ED Triage Notes (Signed)
Pt ambulatory to triage with no difficulty. Pt reports he had surgery on his left arm this past Monday. Pt reports he removed "the metal they had on it" yesterday. Pt has sutures in place to his left forearm and states he would like to have it "rewrapped".

## 2018-01-10 NOTE — Discharge Instructions (Signed)
You have received a dose of IV zosyn and IV vancomycin. Make sure to go straight to [facility] Emergency Department for further evaluation of your wound which looks infected.

## 2018-01-10 NOTE — ED Notes (Signed)
Pt out to desk again inquiring about wait time. Pt states "i've been here a long time". Pt informed MD will see "shortly after 7o'clock". Pt verbalizes understanding.

## 2018-01-10 NOTE — ED Notes (Addendum)
Pt has been redirected again at this time. Pt is on the phone with wife, Communicating that he will be there to get her then they will go to [facility] to see the MD there for his arm. Pt has been redirected from hallway and  asked to sit down on bed and wait for meds to finish

## 2018-01-10 NOTE — ED Notes (Signed)
Pt to restroom

## 2018-01-10 NOTE — ED Notes (Signed)
Report to cassie, rn.  

## 2018-01-10 NOTE — ED Notes (Addendum)
Assessment: pt with bruising noted with swelling to left upper extremity from elbow to hand. Pt states he fell in [place] on a two by four injuring left arm on Monday. Pt states he went to surgery at [facility] for "that there done to my arm". Pt states "those quacks put a metal cast on me but I made them take it off because I can't sleep with that thing on". Pt with sutures noted to left forearm in a "y" pattern. serosanginous exudate noted to tail of "y". Pt with purulent drainage noted to bifucation of "y". Pt states he is here in emergency department today to have a new dressing placed on wound. Pt states he is unsure if he is taking keflex he was prescribed at [facility]. Pt is a very poor historian and has difficulty answering questions regarding medication taken and prescribed. No odor noted from wound. Pt arrives without a dressing in place and pt is touching wound with right hand during assessment.

## 2018-01-10 NOTE — ED Provider Notes (Signed)
[facility] Emergency Department Provider Note  ____________________________________________  Time seen: Approximately 7:29 AM  I have reviewed the triage vital signs and the nursing notes.   HISTORY  Chief Complaint Wound Check   HPI Eric [name] is a 72 y.o. male POD 4 from left extensor tendon laceration repair at [facility] who presents for concerns of wound infection. Patient reports that he was discharged home with a metal cast over his arm which he removed this morning due to severe pressure, pain, and leakage coming from his arm.  When he looked at his arm he noticed that he was very swollen, red, with pus coming from the stitches which made him concerned for possible infection.  His pain is constant, throbbing, moderate, improved since removing the cast.  Patient was discharged on Keflex.  He denies fever or chills, nausea or vomiting.  Past Medical History:  Diagnosis Date  . Anxiety   . 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 symptoms   . Diabetes ([place])   . GERD (gastroesophageal reflux disease)   . HLD (hyperlipidemia)     Past Surgical History:  Procedure Laterality Date  . TONSILLECTOMY  1962    Prior to Admission medications   Medication Sig Start Date End Date Taking? Authorizing Provider  finasteride (PROSCAR) 5 MG tablet Take 1 tablet (5 mg total) by mouth daily. 11/01/16   Zara Council A, PA-C  lisinopril (PRINIVIL,ZESTRIL) 5 MG tablet Take by mouth. 06/25/16 06/25/17  [provider]  lovastatin (MEVACOR) 20 MG tablet Take 20 mg by mouth. 01/30/16   [provider]  metFORMIN (GLUCOPHAGE) 500 MG tablet TAKE 1 TABLET BY MOUTH EVERY DAY 04/24/16   [provider]  Multiple Vitamins-Minerals (CENTRUM SILVER ULTRA WOMENS) TABS Take by mouth. 07/06/07   [provider]  mupirocin ointment (BACTROBAN) 2 % Apply 1 application topically 3 (three) times daily. Patient not taking: Reported on 09/27/2016 12/12/15    Frederich Cha, MD  omeprazole (PRILOSEC) 20 MG capsule Take 20 mg by mouth. 01/30/16   [provider]  ranitidine (ZANTAC) 150 MG tablet Take 150 mg by mouth.    [provider]  sucralfate (CARAFATE) 1 GM/10ML suspension Take 10 mLs (1 g total) by mouth daily after supper. Patient not taking: Reported on 09/27/2016 02/08/16   Paulette Blanch, MD  sulfamethoxazole-trimethoprim (BACTRIM DS,SEPTRA DS) 800-160 MG tablet Take 1 tablet by mouth 2 (two) times daily. Patient not taking: Reported on 09/27/2016 12/12/15   Frederich Cha, MD  tamsulosin (FLOMAX) 0.4 MG CAPS capsule Take 1 capsule (0.4 mg total) by mouth daily. 11/01/16   Zara Council A, PA-C  triazolam (HALCION) 0.125 MG tablet Take by mouth. 05/06/16   [provider]  zolpidem (AMBIEN) 5 MG tablet Take 5 mg by mouth. 01/30/16   [provider]    Allergies Patient has no known allergies.  Family History  Problem Relation Age of Onset  . Prostate cancer Neg Hx   . Kidney cancer Neg Hx   . Bladder Cancer Neg Hx     Social History Social History   Tobacco Use  . Smoking status: Former Research scientist (life sciences)  . Smokeless tobacco: Never Used  . Tobacco comment: quit 10 years   Substance Use Topics  . Alcohol use: No  . Drug use: No    Review of Systems  Constitutional: Negative for fever. Eyes: Negative for visual changes. ENT: Negative for sore throat. Neck: No neck pain  Cardiovascular: Negative for chest pain.  Respiratory: Negative for shortness of breath. Gastrointestinal: Negative for abdominal pain, vomiting or diarrhea. Genitourinary: Negative for dysuria. Musculoskeletal: Negative for back pain. + left arm pain and swelling Skin: Negative for rash. Neurological: Negative for headaches, weakness or numbness. Psych: No SI or HI  ____________________________________________   PHYSICAL EXAM:  VITAL SIGNS: ED Triage Vitals  Enc Vitals Group     BP 01/10/18 0553 (!) 132/92     Pulse Rate  01/10/18 0553 (!) 105     Resp 01/10/18 0553 18     Temp 01/10/18 0553 97.8 F (36.6 C)     Temp Source 01/10/18 0553 Oral     SpO2 01/10/18 0553 97 %     Weight 01/10/18 0549 185 lb (83.9 kg)     Height 01/10/18 0549 5\' 8"  (1.727 m)     Head Circumference --      Peak Flow --      Pain Score 01/10/18 0548 2     Pain Loc --      Pain Edu? --      Excl. in [place]? --     Constitutional: Alert and oriented. Well appearing and in no apparent distress. HEENT:      Head: Normocephalic and atraumatic.         Eyes: Conjunctivae are normal. Sclera is non-icteric.       Mouth/Throat: Mucous membranes are moist.       Neck: Supple with no signs of meningismus. Cardiovascular: Tachycardic with regular rhythm. No murmurs, gallops, or rubs. 2+ symmetrical distal pulses are present in all extremities. No JVD. Respiratory: Normal respiratory effort. Lungs are clear to auscultation bilaterally. No wheezes, crackles, or rhonchi.  Gastrointestinal: Soft, non tender, and non distended with positive bowel sounds. No rebound or guarding. Musculoskeletal: L forearm with stitches in place, small amount of watery purulent discharge noted, there is significant swelling erythema and warmth overlying the dorsum of the L forearm. Neurologic: Normal speech and language. Face is symmetric. Moving all extremities. No gross focal neurologic deficits are appreciated. Skin: Skin is warm, dry and intact. No rash noted. Psychiatric: Mood and affect are normal. Speech and behavior are normal.  ____________________________________________   LABS (all labs ordered are listed, but only abnormal results are displayed)  Labs Reviewed  COMPREHENSIVE METABOLIC PANEL - Abnormal; Notable for the following components:      Result Value   Glucose, Bld 170 (*)    Calcium 8.8 (*)    All other components within normal limits  CBC WITH DIFFERENTIAL/PLATELET - Abnormal; Notable for the following components:   RBC 3.67 (*)     Hemoglobin 12.4 (*)    HCT 34.0 (*)    MCHC 36.5 (*)    Neutro Abs 7.4 (*)    All other components within normal limits   ____________________________________________  EKG  none  ____________________________________________  RADIOLOGY  none  ____________________________________________   PROCEDURES  Procedure(s) performed: None Procedures Critical Care performed:  None ____________________________________________   INITIAL IMPRESSION / ASSESSMENT AND PLAN / ED COURSE  72 y.o. male POD 4 from left extensor tendon laceration repair at [facility] who presents for concerns of wound infection.  Based on exam I am concerned the patient might have a postop infection with significant swelling, erythema, warmth, and a small amount of purulent discharge coming from the surgical incision.  I explained to the patient that he needs to go back to [facility] to be reevaluated by his surgeon.  Patient wishes to drive to [facility].  He is in agreement to stay for antibiotics before will let him go.  He will drive himself to the emergency department.  I will give patient Zosyn and vancomycin.  Labs and vitals with no concern for sepsis.      As part of my medical decision making, I reviewed the following data within the [place] notes reviewed and incorporated, Labs reviewed , Old chart reviewed, Notes from prior ED visits and [place] Controlled Substance Database    Pertinent labs & imaging results that were available during my care of the patient were reviewed by me and considered in my medical decision making (see chart for details).    ____________________________________________   FINAL CLINICAL IMPRESSION(S) / ED DIAGNOSES  Final diagnoses:  Wound infection      NEW MEDICATIONS STARTED DURING THIS VISIT:  ED Discharge Orders    None       Note:  This document was prepared using Dragon voice recognition software and may include unintentional dictation errors.      Alfred Levins, [place], MD 01/10/18 [id number]

## 2019-02-18 ENCOUNTER — Ambulatory Visit (INDEPENDENT_AMBULATORY_CARE_PROVIDER_SITE_OTHER)
Admit: 2019-02-18 | Discharge: 2019-02-18 | Disposition: A | Discharge status: Home / Self Care | Payer: Medicare Other | Attending: Urgent Care | Admitting: Urgent Care

## 2019-02-18 ENCOUNTER — Other Ambulatory Visit: Payer: Self-pay

## 2019-02-18 ENCOUNTER — Ambulatory Visit
Admission: EM | Admit: 2019-02-18 | Discharge: 2019-02-18 | Disposition: A | Discharge status: Home / Self Care | Payer: Medicare Other | Attending: Urgent Care | Admitting: Urgent Care

## 2019-02-18 ENCOUNTER — Encounter: Payer: Self-pay | Admitting: Emergency Medicine

## 2019-02-18 DIAGNOSIS — S0990XA Unspecified injury of head, initial encounter: Secondary | ICD-10-CM

## 2019-02-18 DIAGNOSIS — W19XXXA Unspecified fall, initial encounter: Secondary | ICD-10-CM | POA: Diagnosis not present

## 2019-02-18 NOTE — ED Provider Notes (Signed)
[place], [place]   Name: Eric [name] DOB: May 27, 1946 MRN: [id number] CSN: [id number] PCP: Patient, No Pcp Per  Arrival date and time:  02/18/19 [id number]  Chief Complaint:  Head Injury   NOTE: Prior to seeing the patient today, I have reviewed the triage nursing documentation and vital signs. Clinical staff has updated patient's PMH/PSHx, current medication list, and drug allergies/intolerances to ensure comprehensive history available to assist in medical decision making.   History:   HPI: Eric [name] is a 73 y.o. male who presents today with complaints of pain and swelling to his head following a mechanical fall sustained approximately 30 minute PTA. Patient advising that he was working in his garden when the incident occurred. He notes that he lost his footing, which subsequently caused him to fall and strike his head on the concrete. Patient denies LOC and pain in his neck. Patient reports that he got up and went into the house to tell his wife what had happened. He denies any weakness, visual changes, or difficulty walking. From the time that the incident occurred, until he got inside of his house, he had developed a large hematoma to his LEFT forehead. He does not on daily anticoagulation or antiplatelet therapies. Wife insisted on patient coming in for evaluation. While she was getting ready to come with patient, patient proceeded to leave the home and drive himself in. Wife presents to UC after the patient with his dentures. Patient present CAO x 4. He has not experienced any episodes of nausea or vomiting. Wife reports that patient is at baseline.   Past Medical History:  Diagnosis Date  . Anxiety   . 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 symptoms   . Diabetes ([place])   . GERD (gastroesophageal reflux disease)   . HLD (hyperlipidemia)     Past Surgical History:  Procedure Laterality Date  . arm surgery    . TONSILLECTOMY  1962    Family History  Problem Relation Age of Onset   . Prostate cancer Neg Hx   . Kidney cancer Neg Hx   . Bladder Cancer Neg Hx     Social History   Tobacco Use  . Smoking status: Former Research scientist (life sciences)  . Smokeless tobacco: Never Used  . Tobacco comment: quit 10 years   Substance Use Topics  . Alcohol use: No  . Drug use: No    There are no active problems to display for this patient.   Home Medications:    Current Meds  Medication Sig  . finasteride (PROSCAR) 5 MG tablet Take 1 tablet (5 mg total) by mouth daily.  Marland Kitchen lovastatin (MEVACOR) 20 MG tablet Take 20 mg by mouth.  . metFORMIN (GLUCOPHAGE) 500 MG tablet TAKE 1 TABLET BY MOUTH EVERY DAY  . omeprazole (PRILOSEC) 20 MG capsule Take 20 mg by mouth.  . ranitidine (ZANTAC) 150 MG tablet Take 150 mg by mouth.  . tamsulosin (FLOMAX) 0.4 MG CAPS capsule Take 1 capsule (0.4 mg total) by mouth daily.  . triazolam (HALCION) 0.125 MG tablet Take by mouth.    Allergies:   Patient has no known allergies.  Review of Systems (ROS): Review of Systems  Constitutional: Negative for chills and fever.  HENT:       Head trauma  Respiratory: Negative for cough and shortness of breath.   Cardiovascular: Negative for chest pain and palpitations.  Gastrointestinal: Negative for nausea and vomiting.  Musculoskeletal: Negative for back pain and neck pain.  Skin: Positive for wound.  All other systems reviewed and are negative.    Vital Signs: Today's Vitals   02/18/19 0834 02/18/19 0835 02/18/19 0836 02/18/19 0953  BP: (!) 161/85     Pulse: 98     Resp: 20     Temp: (!) 97.4 F (36.3 C)     TempSrc: Oral     SpO2: 98%     Weight:   184 lb (83.5 kg)   Height:   5\' 8"  (1.727 m)   PainSc:  4   4     Physical Exam: Physical Exam  Constitutional: He is oriented to person, place, and time and well-developed, well-nourished, and in no distress.  HENT:  Head: Normocephalic. Head is without raccoon's eyes and without Battle's sign.    Right Ear: Hearing, tympanic membrane, external ear  and ear canal normal.  Left Ear: Hearing, tympanic membrane, external ear and ear canal normal.  Nose: Nose normal.  Mouth/Throat: Uvula is midline, oropharynx is clear and moist and mucous membranes are normal.  Large hematoma to LEFT frontal forehead with overlying abrasion; bleeding controlled. No surrounding crepitus or evidence of skull depression.   Eyes: Pupils are equal, round, and reactive to light. EOM are normal.  Neck: Trachea normal, normal range of motion and full passive range of motion without pain. No spinous process tenderness and no muscular tenderness present.  Cardiovascular: Normal rate, regular rhythm, normal heart sounds and intact distal pulses. Exam reveals no gallop and no friction rub.  No murmur heard. Pulmonary/Chest: Effort normal and breath sounds normal. No respiratory distress. He has no wheezes. He has no rales.  Neurological: He is alert and oriented to person, place, and time. Gait normal. GCS score is 15.  Skin: Skin is warm and dry. No rash noted.  Psychiatric: Mood, memory, affect and judgment normal.  Nursing note and vitals reviewed.  Urgent Care Treatments / Results:   LABS: PLEASE NOTE: all labs that were ordered this encounter are listed, however only abnormal results are displayed. Labs Reviewed - No data to display  EKG: -None  RADIOLOGY: Ct Head Wo Contrast  Result Date: 02/18/2019 CLINICAL DATA:  Fall, head injury EXAM: CT HEAD WITHOUT CONTRAST TECHNIQUE: Contiguous axial images were obtained from the base of the skull through the vertex without intravenous contrast. COMPARISON:  None. FINDINGS: Brain: Moderate ventricular enlargement. Third and lateral ventricles are dilated. Fourth ventricle nondilated. Prominent lipoma involving the tectum measuring 21 x 18 x 19 mm appears to be causing obstructive hydrocephalus at the level of the aqueduct. There is also some associated calcification in the tectum. No acute injury. Negative for acute  hemorrhage or infarct. Encephalomalacia right temporal lobe likely posttraumatic. Vascular: Negative for hyperdense vessel Skull: Negative for skull fracture.  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 This is likely a long-standing process however no prior imaging is available. Correlate with prior reports of available. Encephalomalacia right temporal lobe which may be due to chronic ischemia or injury. These results will be called to the ordering clinician or representative by the Radiologist Assistant, and communication documented in the PACS or zVision Dashboard. Electronically Signed   By: Franchot Gallo M.D.   On: 02/18/2019 09:38    PROCEDURES: Procedures  MEDICATIONS RECEIVED THIS VISIT: Medications - No data to display  PERTINENT CLINICAL COURSE NOTES/UPDATES:   Initial Impression / Assessment and Plan / Urgent Care Course:  Pertinent labs & imaging  results that were available during my care of the patient were personally reviewed by me and considered in my medical decision making (see lab/imaging section of note for values and interpretations).  NOVAH TORRUELLA is a 73 y.o. male who [facility] to [facility] today with complaints of Head Injury   Patient is well appearing overall in clinic today. He does not appear to be in any acute distress. Presenting symptoms (see HPI) and exam as documented above. Patient with no acute neurological findings today. He feels generally well; states, "just my pride hurts". Patient ambulatory without difficulties. He drove himself to clinic today. CT of the head was negative for any acute findings. 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 Discussed findings with supervising physician Lanny Cramp, MD) who advised to that findings were chronic; advised to follow up with PCP regarding CT results. Findings  discussed with the patient today. He was encouraged to continue to apply ice as needed for swelling. May use Tylenol as needed for pain. Patient to keep abrasion to head clean and dry. Advised to apply TAO daily. Discussed need to monitor patient at home with his wife. Reviewed concussion symptoms and need for rest for the next couple of days. Encouraged wife to advise patient if and when she feels he is doing too much.   I have reviewed the follow up and strict return precautions for any new or worsening symptoms. Patient made aware of symptoms that would be deemed urgent/emergent (intractable headache, nausea, vomiting, AMS, speech difficulties, gait alterations, excessive somnolence, etc), and would thus require further evaluation either here or in the emergency department. At the time of discharge, he verbalized understanding and consent with the discharge plan as it was reviewed with him. All questions were fielded by provider and/or clinic staff prior to patient discharge.    Final Clinical Impressions / Urgent Care Diagnoses:   Final diagnoses:  Fall, initial encounter  Injury of head, initial encounter    New Prescriptions:  [place] Controlled Substance Registry consulted? Not Applicable  No orders of the defined types were placed in this encounter.   Recommended Follow up Care:  Patient encouraged to follow up with the following provider within the specified time frame, or sooner as dictated by the severity of his symptoms. As always, he was instructed that for any urgent/emergent care needs, he should seek care either here or in the emergency department for more immediate evaluation.  Follow-up Information    PCP.   Why: Call to discuss fall and CT scan results.        NOTE: This note was prepared using Lobbyist along with smaller Company secretary. Despite my best ability to proofread, there is the potential that transcriptional errors may still occur from this  process, and are completely unintentional.     Karen Kitchens, NP 02/19/19 0210

## 2019-02-18 NOTE — ED Triage Notes (Signed)
Patient states he was outside and fell hitting his head on the concrete.

## 2019-02-18 NOTE — Discharge Instructions (Addendum)
It was very nice seeing you today in clinic. Thank you for entrusting me with your care.   Monitor symptoms at home. Any headaches, excessive sleepiness, changes to vision, weakness, difficulty walking, nausea/vomiting, or other concerning symptoms will need to be seen for further evaluation in the emergency department.  Keep abrasion clean and dry. Apply Neosporin daily. Use ice pack to help with swelling to your forehead.   Please remember, our [place] providers are "right here with you" when you need [place]. Again, it was my pleasure to take care of you today. Thank you for choosing our clinic. I hope that you start to feel better quickly.   Honor Loh, MSN, APRN, FNP-C, CEN Advanced Practice Provider [facility]

## 2019-02-20 ENCOUNTER — Encounter: Payer: Self-pay | Admitting: Emergency Medicine

## 2019-02-20 ENCOUNTER — Other Ambulatory Visit: Payer: Self-pay

## 2019-02-20 ENCOUNTER — Emergency Department
Admission: EM | Admit: 2019-02-20 | Discharge: 2019-02-20 | Disposition: A | Discharge status: Home / Self Care | Payer: Medicare Other | Attending: Emergency Medicine | Admitting: Emergency Medicine

## 2019-02-20 DIAGNOSIS — Y939 Activity, unspecified: Secondary | ICD-10-CM | POA: Insufficient documentation

## 2019-02-20 DIAGNOSIS — Z79899 Other long term (current) drug therapy: Secondary | ICD-10-CM | POA: Diagnosis not present

## 2019-02-20 DIAGNOSIS — S0510XA Contusion of eyeball and orbital tissues, unspecified eye, initial encounter: Secondary | ICD-10-CM

## 2019-02-20 DIAGNOSIS — S0083XD Contusion of other part of head, subsequent encounter: Secondary | ICD-10-CM

## 2019-02-20 DIAGNOSIS — S0011XA Contusion of right eyelid and periocular area, initial encounter: Secondary | ICD-10-CM | POA: Diagnosis not present

## 2019-02-20 DIAGNOSIS — W010XXA Fall on same level from slipping, tripping and stumbling without subsequent striking against object, initial encounter: Secondary | ICD-10-CM | POA: Diagnosis not present

## 2019-02-20 DIAGNOSIS — Z7984 Long term (current) use of oral hypoglycemic drugs: Secondary | ICD-10-CM | POA: Diagnosis not present

## 2019-02-20 DIAGNOSIS — S0993XA Unspecified injury of face, initial encounter: Secondary | ICD-10-CM | POA: Diagnosis present

## 2019-02-20 DIAGNOSIS — Z87891 Personal history of nicotine dependence: Secondary | ICD-10-CM | POA: Diagnosis not present

## 2019-02-20 DIAGNOSIS — E119 Type 2 diabetes mellitus without complications: Secondary | ICD-10-CM | POA: Insufficient documentation

## 2019-02-20 DIAGNOSIS — Y92008 Other place in unspecified non-institutional (private) residence as the place of occurrence of the external cause: Secondary | ICD-10-CM | POA: Diagnosis not present

## 2019-02-20 DIAGNOSIS — Y999 Unspecified external cause status: Secondary | ICD-10-CM | POA: Diagnosis not present

## 2019-02-20 DIAGNOSIS — S0012XA Contusion of left eyelid and periocular area, initial encounter: Secondary | ICD-10-CM | POA: Insufficient documentation

## 2019-02-20 MED ORDER — BACITRACIN-NEOMYCIN-POLYMYXIN 400-5-5000 EX OINT
TOPICAL_OINTMENT | Freq: Once | CUTANEOUS | Status: DC
  Filled 2019-02-20: qty 1

## 2019-02-20 NOTE — ED Triage Notes (Signed)
Ecchymosis L forehead and periorbital region. States fell 2 days ago. Denies LOC. Was seen post fall. Wife concerned due to noticing descending ecchymosis.

## 2019-02-20 NOTE — ED Provider Notes (Signed)
[facility] Emergency Department Provider Note ____________________________________________  Time seen: 1129  I have reviewed the triage vital signs and the nursing notes.  HISTORY  Chief Complaint  Facial Pain  HPI Eric [name] is a 73 y.o. male Libby Maw to the ED accompanied by his wife, for evaluation of progressive bruising and swelling around the eyes.  Patient had a mechanical fall 2 days prior, when he slipped on the ramp to a shed.  He apparently had a large hematoma and was evaluated by CT imaging at [facility] urgent care.  He was cleared from the standpoint of any acute closed head injury at the time and was discharged with care of his forehead hematoma.  He presents today with at the urging of his wife, for evaluation of bruising around the eyes bilaterally.  Patient denies any injury in the interim.  Is also denied any weakness, chest pain, shortness of breath, or syncope.  Past Medical History:  Diagnosis Date  . Anxiety   . 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 symptoms   . Diabetes ([place])   . GERD (gastroesophageal reflux disease)   . HLD (hyperlipidemia)     There are no active problems to display for this patient.   Past Surgical History:  Procedure Laterality Date  . arm surgery    . TONSILLECTOMY  1962    Prior to Admission medications   Medication Sig Start Date End Date Taking? Authorizing Provider  finasteride (PROSCAR) 5 MG tablet Take 1 tablet (5 mg total) by mouth daily. 11/01/16   Zara Council A, PA-C  lisinopril (PRINIVIL,ZESTRIL) 5 MG tablet Take by mouth. 06/25/16 06/25/17  [provider]  lovastatin (MEVACOR) 20 MG tablet Take 20 mg by mouth. 01/30/16   [provider]  metFORMIN (GLUCOPHAGE) 500 MG tablet TAKE 1 TABLET BY MOUTH EVERY DAY 04/24/16   [provider]  Multiple Vitamins-Minerals (CENTRUM SILVER ULTRA WOMENS) TABS Take by mouth. 07/06/07   [provider]  mupirocin ointment  (BACTROBAN) 2 % Apply 1 application topically 3 (three) times daily. Patient not taking: Reported on 09/27/2016 12/12/15   Frederich Cha, MD  omeprazole (PRILOSEC) 20 MG capsule Take 20 mg by mouth. 01/30/16   [provider]  ranitidine (ZANTAC) 150 MG tablet Take 150 mg by mouth.    [provider]  sucralfate (CARAFATE) 1 GM/10ML suspension Take 10 mLs (1 g total) by mouth daily after supper. Patient not taking: Reported on 09/27/2016 02/08/16   Paulette Blanch, MD  sulfamethoxazole-trimethoprim (BACTRIM DS,SEPTRA DS) 800-160 MG tablet Take 1 tablet by mouth 2 (two) times daily. Patient not taking: Reported on 09/27/2016 12/12/15   Frederich Cha, MD  tamsulosin (FLOMAX) 0.4 MG CAPS capsule Take 1 capsule (0.4 mg total) by mouth daily. 11/01/16   Zara Council A, PA-C  triazolam (HALCION) 0.125 MG tablet Take by mouth. 05/06/16   [provider]  zolpidem (AMBIEN) 5 MG tablet Take 5 mg by mouth. 01/30/16   [provider]    Allergies Patient has no known allergies.  Family History  Problem Relation Age of Onset  . Prostate cancer Neg Hx   . Kidney cancer Neg Hx   . Bladder Cancer Neg Hx     Social History Social History   Tobacco Use  . Smoking status: Former Research scientist (life sciences)  . Smokeless tobacco: Never Used  . Tobacco comment: quit 10 years   Substance Use Topics  . Alcohol use: No  . Drug use: No  Review of Systems  Constitutional: Negative for fever. Eyes: Negative for visual changes.  Bilateral periorbital ecchymosis as above. ENT: Negative for sore throat. Cardiovascular: Negative for chest pain. Respiratory: Negative for shortness of breath. Gastrointestinal: Negative for abdominal pain, vomiting and diarrhea. Genitourinary: Negative for dysuria. Musculoskeletal: Negative for back pain. Skin: Negative for rash.  Healing right forehead abrasion. Neurological: Negative for headaches, focal weakness or  numbness. ____________________________________________  PHYSICAL EXAM:  VITAL SIGNS: ED Triage Vitals  Enc Vitals Group     BP 02/20/19 1048 (!) 132/100     Pulse Rate 02/20/19 1048 72     Resp 02/20/19 1048 20     Temp 02/20/19 1048 98.5 F (36.9 C)     Temp Source 02/20/19 1048 Oral     SpO2 02/20/19 1048 100 %     Weight 02/20/19 1050 187 lb (84.8 kg)     Height 02/20/19 1050 5\' 8"  (1.727 m)     Head Circumference --      Peak Flow --      Pain Score 02/20/19 1050 0     Pain Loc --      Pain Edu? --      Excl. in [place]? --     Constitutional: Alert and oriented. Well appearing and in no distress.  GCS =15 Head: Normocephalic and atraumatic. Eyes: Conjunctivae are normal. PERRL. Normal extraocular movements and fundi bilaterally.  Periorbital ecchymosis noted bilaterally.  Some mild periorbital edema is also appreciated.  No trauma to the eyes or orbital rims. Ears: Canals clear. TMs intact bilaterally. Nose: No congestion/rhinorrhea/epistaxis. Mouth/Throat: Mucous membranes are moist. Neck: Supple. No thyromegaly. Hematological/Lymphatic/Immunological: No cervical lymphadenopathy. Cardiovascular: Normal rate, regular rhythm. Normal distal pulses. Respiratory: Normal respiratory effort. No wheezes/rales/rhonchi. Musculoskeletal: Nontender with normal range of motion in all extremities.  Neurologic:  Normal gait without ataxia. Normal speech and language. No gross focal neurologic deficits are appreciated. Skin:  Skin is warm, dry and intact. No rash noted. Psychiatric: Mood and affect are normal. Patient exhibits appropriate insight and judgment. ____________________________________________  PROCEDURES  Neosporin applied Procedures ____________________________________________  INITIAL IMPRESSION / ASSESSMENT AND PLAN / ED COURSE  Patient with ED evaluation for concern over progressive bruising and swelling that settled around the eyes 2 days following a head injury.   Patient was evaluated following mechanical fall and found to have a large scalp hematoma but no intracranial process.  His wife was surprised to note that he had bruising and swelling around the eyes bilaterally.  No injury in the interim.  Wife and husband are notified that this progressive periorbital edema and ecchymosis is common after a scalp or forehead hematoma.  Patient exam is overall benign and reassuring at this time.  They are also reassured that this self-limited bruising and swelling will resolve without intervention.  They are encouraged to apply cool compresses to the eyes to help promote healing.  They will follow-up with primary provider return to the ED as needed.  Eric [name] was evaluated in Emergency Department on 02/20/2019 for the symptoms described in the history of present illness. He was evaluated in the context of the global COVID-19 pandemic, which necessitated consideration that the patient might be at risk for infection with the SARS-CoV-2 virus that causes COVID-19. Institutional protocols and algorithms that pertain to the evaluation of patients at risk for COVID-19 are in a state of rapid change based on information released by regulatory bodies including the CDC and federal and state organizations. These policies  and algorithms were followed during the patient's care in the ED. ____________________________________________  FINAL CLINICAL IMPRESSION(S) / ED DIAGNOSES  Final diagnoses:  Traumatic periorbital ecchymosis, initial encounter  Facial contusion, subsequent encounter      Melvenia Needles, PA-C 02/20/19 [place]    Vanessa Diagonal, MD 02/21/19 [phone]

## 2019-02-20 NOTE — Discharge Instructions (Addendum)
Your exam is consistent with the progression of bruising after a facial contusion. Your forehead hematoma is now settling into the space around the eyes, this is common. It does not indicate a more serious injury. Your exam is otherwise normal. Continue to place cool compresses on the eyes to reduce swelling. It may be a another week or two before the bruising is completely gone.

## 2019-05-23 ENCOUNTER — Emergency Department
Admission: EM | Admit: 2019-05-23 | Discharge: 2019-05-23 | Disposition: A | Discharge status: Home / Self Care | Payer: Medicare Other | Attending: Emergency Medicine | Admitting: Emergency Medicine

## 2019-05-23 ENCOUNTER — Other Ambulatory Visit: Payer: Self-pay

## 2019-05-23 DIAGNOSIS — Z5321 Procedure and treatment not carried out due to patient leaving prior to being seen by health care provider: Secondary | ICD-10-CM | POA: Diagnosis not present

## 2019-05-23 DIAGNOSIS — G4700 Insomnia, unspecified: Secondary | ICD-10-CM | POA: Insufficient documentation

## 2019-05-23 NOTE — ED Notes (Signed)
Per sonja, rn pt stated he was leaving and walked out.

## 2019-05-23 NOTE — ED Triage Notes (Signed)
Pt arrives POV with cc of unable to go to sleep. Pt arrives carrying empty ZZZquil and Aleve PM bottles. Pt states he took one tablespoon of ZZZquil and 2 Aleve PM pills. Pt states the rest of the pills are at the house but he wanted to bring the bottles to show [place] what he took.  Pt states he's had trouble sleeping for the "past month or two."  Pt also complains his throat hurts because the Aleve pm "dries his throat out real bad."

## 2019-07-23 ENCOUNTER — Other Ambulatory Visit: Payer: Self-pay

## 2019-07-23 ENCOUNTER — Emergency Department
Admission: EM | Admit: 2019-07-23 | Discharge: 2019-07-23 | Discharge status: Against Medical Advice | Payer: Medicare Other

## 2019-07-23 NOTE — ED Notes (Signed)
No answer when called several times from lobby 

## 2019-07-23 NOTE — ED Triage Notes (Signed)
Patient c/o "dry throat". Patient reports he can't sleep due to discomfort. Patient reports his throat feels tight, but denies pain with swallowing.

## 2020-07-07 ENCOUNTER — Emergency Department
Admission: EM | Admit: 2020-07-07 | Discharge: 2020-07-07 | Discharge status: Against Medical Advice | Payer: Medicare Other

## 2020-07-07 NOTE — ED Notes (Signed)
Called for triage x 1, no answer. Pt not in lobby, bathroom, or outside

## 2020-07-17 ENCOUNTER — Other Ambulatory Visit: Payer: Self-pay

## 2020-07-17 ENCOUNTER — Emergency Department: Payer: Medicare Other

## 2020-07-17 ENCOUNTER — Emergency Department
Admission: EM | Admit: 2020-07-17 | Discharge: 2020-07-17 | Disposition: A | Discharge status: Home / Self Care | Payer: Medicare Other | Attending: Emergency Medicine | Admitting: Emergency Medicine

## 2020-07-17 ENCOUNTER — Encounter: Payer: Self-pay | Admitting: Emergency Medicine

## 2020-07-17 DIAGNOSIS — W000XXA Fall on same level due to ice and snow, initial encounter: Secondary | ICD-10-CM | POA: Insufficient documentation

## 2020-07-17 DIAGNOSIS — Z5321 Procedure and treatment not carried out due to patient leaving prior to being seen by health care provider: Secondary | ICD-10-CM | POA: Diagnosis not present

## 2020-07-17 DIAGNOSIS — R0781 Pleurodynia: Secondary | ICD-10-CM | POA: Diagnosis present

## 2020-07-17 NOTE — ED Notes (Signed)
Patient transported to X-ray 

## 2020-07-17 NOTE — ED Triage Notes (Signed)
Patient states that he slipped on the ice two days ago and is having right side rib pain.

## 2020-07-19 ENCOUNTER — Telehealth: Payer: Self-pay | Admitting: Emergency Medicine

## 2020-07-19 NOTE — Telephone Encounter (Signed)
Called patient due to left emergency department before provider exam to inquire about condition and follow up plans. I told him his xray was suspicious for rib fracture and that he should follow up with his doctor.  I told him to return here as needed and stressed importance of doing deep breathing about every hour while awake.

## 2020-10-23 ENCOUNTER — Other Ambulatory Visit: Payer: Self-pay

## 2020-10-23 ENCOUNTER — Emergency Department
Admission: EM | Admit: 2020-10-23 | Discharge: 2020-10-23 | Disposition: A | Discharge status: Home / Self Care | Payer: Medicare Other | Attending: Emergency Medicine | Admitting: Emergency Medicine

## 2020-10-23 DIAGNOSIS — M79605 Pain in left leg: Secondary | ICD-10-CM | POA: Insufficient documentation

## 2020-10-23 DIAGNOSIS — M79604 Pain in right leg: Secondary | ICD-10-CM | POA: Insufficient documentation

## 2020-10-23 DIAGNOSIS — Z5321 Procedure and treatment not carried out due to patient leaving prior to being seen by health care provider: Secondary | ICD-10-CM | POA: Diagnosis not present

## 2020-10-23 NOTE — ED Triage Notes (Signed)
Pt comes with c/o bilateral leg pain. Pt states he fell month ago and legs have been hurting since.  Pt states he feels weak and just can't walk that well.  Pt denies any dizziness, CP or SOB,

## 2020-10-24 ENCOUNTER — Emergency Department: Payer: Medicare Other

## 2020-10-24 ENCOUNTER — Other Ambulatory Visit: Payer: Self-pay

## 2020-10-24 ENCOUNTER — Emergency Department
Admission: EM | Admit: 2020-10-24 | Discharge: 2020-10-24 | Disposition: A | Discharge status: Home / Self Care | Payer: Medicare Other | Attending: Emergency Medicine | Admitting: Emergency Medicine

## 2020-10-24 DIAGNOSIS — R262 Difficulty in walking, not elsewhere classified: Secondary | ICD-10-CM | POA: Diagnosis present

## 2020-10-24 DIAGNOSIS — G911 Obstructive hydrocephalus: Secondary | ICD-10-CM | POA: Diagnosis not present

## 2020-10-24 DIAGNOSIS — E785 Hyperlipidemia, unspecified: Secondary | ICD-10-CM | POA: Diagnosis not present

## 2020-10-24 DIAGNOSIS — E1169 Type 2 diabetes mellitus with other specified complication: Secondary | ICD-10-CM | POA: Diagnosis not present

## 2020-10-24 DIAGNOSIS — Z79899 Other long term (current) drug therapy: Secondary | ICD-10-CM | POA: Diagnosis not present

## 2020-10-24 DIAGNOSIS — Z7984 Long term (current) use of oral hypoglycemic drugs: Secondary | ICD-10-CM | POA: Diagnosis not present

## 2020-10-24 DIAGNOSIS — Z87891 Personal history of nicotine dependence: Secondary | ICD-10-CM | POA: Diagnosis not present

## 2020-10-24 LAB — CBC WITH DIFFERENTIAL/PLATELET
Abs Immature Granulocytes: 0.03 10*3/uL (ref 0.00–0.07)
Basophils Absolute: 0 10*3/uL (ref 0.0–0.1)
Basophils Relative: 1 %
Eosinophils Absolute: 0.1 10*3/uL (ref 0.0–0.5)
Eosinophils Relative: 2 %
HCT: 38.2 % — ABNORMAL LOW (ref 39.0–52.0)
Hemoglobin: 13.7 g/dL (ref 13.0–17.0)
Immature Granulocytes: 1 %
Lymphocytes Relative: 23 %
Lymphs Abs: 1.3 10*3/uL (ref 0.7–4.0)
MCH: 33.1 pg (ref 26.0–34.0)
MCHC: 35.9 g/dL (ref 30.0–36.0)
MCV: 92.3 fL (ref 80.0–100.0)
Monocytes Absolute: 0.5 10*3/uL (ref 0.1–1.0)
Monocytes Relative: 9 %
Neutro Abs: 3.6 10*3/uL (ref 1.7–7.7)
Neutrophils Relative %: 64 %
Platelets: 254 10*3/uL (ref 150–400)
RBC: 4.14 MIL/uL — ABNORMAL LOW (ref 4.22–5.81)
RDW: 11.3 % — ABNORMAL LOW (ref 11.5–15.5)
WBC: 5.5 10*3/uL (ref 4.0–10.5)
nRBC: 0 % (ref 0.0–0.2)

## 2020-10-24 LAB — URINALYSIS, COMPLETE (UACMP) WITH MICROSCOPIC
Bacteria, UA: NONE SEEN
Bilirubin Urine: NEGATIVE
Glucose, UA: NEGATIVE mg/dL
Ketones, ur: NEGATIVE mg/dL
Leukocytes,Ua: NEGATIVE
Nitrite: NEGATIVE
Protein, ur: NEGATIVE mg/dL
Specific Gravity, Urine: 1.014 (ref 1.005–1.030)
Squamous Epithelial / HPF: NONE SEEN (ref 0–5)
pH: 6 (ref 5.0–8.0)

## 2020-10-24 LAB — TROPONIN I (HIGH SENSITIVITY): Troponin I (High Sensitivity): 6 ng/L (ref <18)

## 2020-10-24 LAB — COMPREHENSIVE METABOLIC PANEL
ALT: 35 U/L (ref 0–44)
AST: 25 U/L (ref 15–41)
Albumin: 4.2 g/dL (ref 3.5–5.0)
Alkaline Phosphatase: 46 U/L (ref 38–126)
Anion gap: 9 (ref 5–15)
BUN: 14 mg/dL (ref 8–23)
CO2: 25 mmol/L (ref 22–32)
Calcium: 9 mg/dL (ref 8.9–10.3)
Chloride: 102 mmol/L (ref 98–111)
Creatinine, Ser: 0.82 mg/dL (ref 0.61–1.24)
GFR, Estimated: >60 mL/min (ref >60)
Glucose, Bld: 162 mg/dL — ABNORMAL HIGH (ref 70–99)
Potassium: 3.6 mmol/L (ref 3.5–5.1)
Sodium: 136 mmol/L (ref 135–145)
Total Bilirubin: 0.6 mg/dL (ref 0.3–1.2)
Total Protein: 6.7 g/dL (ref 6.5–8.1)

## 2020-10-24 MED ORDER — LORAZEPAM 2 MG/ML IJ SOLN: 1.0000 mg | INTRAMUSCULAR | Status: DC | PRN

## 2020-10-24 NOTE — ED Notes (Signed)
Pt fully dressed heading up hall stating that he was going home. Wife was following but was unable to help keep pt from leaving. EDP notified and to lobby to intercept pt but was unable to stop pt. Pt walked out to his truck and left. Wife talked to EDP and returned to room for discharge paperwork.

## 2020-10-24 NOTE — ED Triage Notes (Addendum)
Reports he was here yesterday, but left before he saw a doctor. C/o left leg pain since fall on ice X 1 month ago. Pt alert and oriented X 4. Story of events leading to ED visit are confusing, pt recalls differently each time he tells RN precipitating events. Pt states he can walk, but "not well". At end of triage, starts saying that both of legs hurt and his head as well. Poor historian.

## 2020-10-24 NOTE — Discharge Instructions (Signed)
You have a small growth on your brain that is blocking the flow of spinal fluid. You need to schedule an appointment to follow-up with a neurosurgeon as soon as possible to address this. Please return to the ER for reevaluation for worsening confusion, difficulty walking, or other worsening symptoms.

## 2020-10-24 NOTE — ED Notes (Signed)
Called pt at home and left message concerning iv being left in when pt walked out and asking them to call me.

## 2020-10-24 NOTE — ED Provider Notes (Signed)
[facility] Emergency Department Provider Note   ____________________________________________   Event Date/Time   First MD Initiated Contact with Patient 10/24/20 1335     (approximate)  I have reviewed the triage vital signs and the nursing notes.   HISTORY  Chief Complaint Leg Pain    HPI Eric [name] is a 75 y.o. male with past medical history of hyperlipidemia, diabetes, GERD, and anxiety who presents to the ED for difficulty walking.  Patient states that he has had pain in his legs and trouble walking ever since he fell on the snow and ice about 1 month ago.  He thinks he hit his head and is not sure whether he lost consciousness.  He reports dealing with pain in both of his legs since then but states he thinks it is due to something with his head.  He does not take any blood thinners.  He denies any specific areas of pain in his legs but states "I cannot walk like I used to."  He checked into the ED yesterday for similar symptoms, but left prior to being seen.  He denies any fevers, cough, chest pain, shortness of breath, dysuria, hematuria, nausea, or vomiting.  He states he does not currently have a primary care doctor.        Past Medical History:  Diagnosis Date  . Anxiety   . 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 symptoms   . Diabetes ([place])   . GERD (gastroesophageal reflux disease)   . HLD (hyperlipidemia)     There are no problems to display for this patient.   Past Surgical History:  Procedure Laterality Date  . arm surgery    . TONSILLECTOMY  1962    Prior to Admission medications   Medication Sig Start Date End Date Taking? Authorizing Provider  finasteride (PROSCAR) 5 MG tablet Take 1 tablet (5 mg total) by mouth daily. 11/01/16   Zara Council A, PA-C  lisinopril (PRINIVIL,ZESTRIL) 5 MG tablet Take by mouth. 06/25/16 06/25/17  [provider]  lovastatin (MEVACOR) 20 MG tablet Take 20 mg by mouth. 01/30/16    [provider]  metFORMIN (GLUCOPHAGE) 500 MG tablet TAKE 1 TABLET BY MOUTH EVERY DAY 04/24/16   [provider]  Multiple Vitamins-Minerals (CENTRUM SILVER ULTRA WOMENS) TABS Take by mouth. 07/06/07   [provider]  mupirocin ointment (BACTROBAN) 2 % Apply 1 application topically 3 (three) times daily. Patient not taking: Reported on 09/27/2016 12/12/15   Frederich Cha, MD  omeprazole (PRILOSEC) 20 MG capsule Take 20 mg by mouth. 01/30/16   [provider]  ranitidine (ZANTAC) 150 MG tablet Take 150 mg by mouth.    [provider]  sucralfate (CARAFATE) 1 GM/10ML suspension Take 10 mLs (1 g total) by mouth daily after supper. Patient not taking: Reported on 09/27/2016 02/08/16   Paulette Blanch, MD  sulfamethoxazole-trimethoprim (BACTRIM DS,SEPTRA DS) 800-160 MG tablet Take 1 tablet by mouth 2 (two) times daily. Patient not taking: Reported on 09/27/2016 12/12/15   Frederich Cha, MD  tamsulosin (FLOMAX) 0.4 MG CAPS capsule Take 1 capsule (0.4 mg total) by mouth daily. 11/01/16   Zara Council A, PA-C  triazolam (HALCION) 0.125 MG tablet Take by mouth. 05/06/16   [provider]  zolpidem (AMBIEN) 5 MG tablet Take 5 mg by mouth. 01/30/16   [provider]    Allergies Patient has no known allergies.  Family History  Problem Relation Age of Onset  . Prostate cancer  Neg Hx   . Kidney cancer Neg Hx   . Bladder Cancer Neg Hx     Social History Social History   Tobacco Use  . Smoking status: Former Research scientist (life sciences)  . Smokeless tobacco: Never Used  . Tobacco comment: quit 10 years   Vaping Use  . Vaping Use: Never used  Substance Use Topics  . Alcohol use: No  . Drug use: No    Review of Systems  Constitutional: No fever/chills.  Positive for generalized weakness. Eyes: No visual changes. ENT: No sore throat. Cardiovascular: Denies chest pain. Respiratory: Denies shortness of breath. Gastrointestinal: No abdominal pain.  No nausea,  no vomiting.  No diarrhea.  No constipation. Genitourinary: Negative for dysuria. Musculoskeletal: Negative for back pain.  Positive for leg pain. Skin: Negative for rash. Neurological: Negative for headaches, focal weakness or numbness.  ____________________________________________   PHYSICAL EXAM:  VITAL SIGNS: ED Triage Vitals [10/24/20 1329]  Enc Vitals Group     BP (!) 164/77     Pulse Rate 97     Resp 16     Temp 98 F (36.7 C)     Temp Source Oral     SpO2 100 %     Weight 178 lb 9.2 oz (81 kg)     Height 5\' 8"  (1.727 m)     Head Circumference      Peak Flow      Pain Score 6     Pain Loc      Pain Edu?      Excl. in [place]?     Constitutional: Alert and oriented to person, place, time, and situation. Eyes: Conjunctivae are normal. Head: Atraumatic. Nose: No congestion/rhinnorhea. Mouth/Throat: Mucous membranes are moist. Neck: Normal ROM, no midline cervical spine tenderness to palpation. Cardiovascular: Normal rate, regular rhythm. Grossly normal heart sounds. Respiratory: Normal respiratory effort.  No retractions. Lungs CTAB. Gastrointestinal: Soft and nontender. No distention. Genitourinary: deferred Musculoskeletal: No lower extremity tenderness nor edema. Neurologic:  Normal speech and language. No gross focal neurologic deficits are appreciated. Skin:  Skin is warm, dry and intact. No rash noted. Psychiatric: Mood and affect are normal. Speech and behavior are normal.  ____________________________________________   LABS (all labs ordered are listed, but only abnormal results are displayed)  Labs Reviewed  CBC WITH DIFFERENTIAL/PLATELET - Abnormal; Notable for the following components:      Result Value   RBC 4.14 (*)    HCT 38.2 (*)    RDW 11.3 (*)    All other components within normal limits  COMPREHENSIVE METABOLIC PANEL - Abnormal; Notable for the following components:   Glucose, Bld 162 (*)    All other components within normal limits   URINALYSIS, COMPLETE (UACMP) WITH MICROSCOPIC - Abnormal; Notable for the following components:   Color, Urine YELLOW (*)    APPearance CLEAR (*)    Hgb urine dipstick SMALL (*)    All other components within normal limits  TROPONIN I (HIGH SENSITIVITY)   ____________________________________________  EKG  ED ECG REPORT I, Blake Divine, the attending physician, personally viewed and interpreted this ECG.   Date: 10/24/2020  EKG Time: 13:56  Rate: 94  Rhythm: normal sinus rhythm  Axis: Normal  Intervals:none  ST&T Change: None   PROCEDURES  Procedure(s) performed (including Critical Care):  Procedures   ____________________________________________   INITIAL IMPRESSION / ASSESSMENT AND PLAN / ED COURSE       75 year old male with past medical history of hyperlipidemia, diabetes, GERD, and anxiety  who presents to the ED with bilateral leg pain and difficulty walking for the past few weeks.  He states he slipped and fell on ice about 1 month ago, however he would be unlikely that there was ice and snow as recent as 1 month ago.  He does not appear oriented on the remainder of my questioning and does not have any focal neurologic deficits.  We will check CT head and cervical spine given reported trauma, further assess his generalized weakness with labs, UA, and chest x-ray.  There are no signs of trauma to his lower extremities and he has no bony tenderness, is neurovascularly intact to his bilateral lower extremities.  Labs are unremarkable and UA shows no signs of infection.  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  This does appear to be a chronic finding back to 2020, although slightly worse today.  This is likely contributing to his mild confusion but patient has a steady gait while ambulating here in the ED.  Speaking with his wife, patient has had  a gradual decline in his mental status and functional ability since January.  Findings were discussed with Dr. Lacinda Axon of neurosurgery, who requests MRI here in the ED after which patient would be appropriate for outpatient neurosurgery follow-up for potential VP shunt.  Patient became upset while waiting for MRI and demanded to leave.  He is not a threat to himself or others at this time and I do not have grounds to hold him against as well.  Per neurosurgery, he is appropriate for outpatient follow-up for surgical planning, wife was advised of this plan and provided with contact information for neurosurgery.  I was unable to explain risks of leaving [place] to patient before he left the emergency department.      ____________________________________________   FINAL CLINICAL IMPRESSION(S) / ED DIAGNOSES  Final diagnoses:  Obstructive hydrocephalus [facility])     ED Discharge Orders    None       Note:  This document was prepared using Dragon voice recognition software and may include unintentional dictation errors.   Blake Divine, MD 10/24/20 [phone]

## 2020-10-24 NOTE — ED Notes (Signed)
pts wife called and said that pt had removed iv. I thanked her for return call.

## 2020-11-02 ENCOUNTER — Other Ambulatory Visit: Payer: Self-pay | Admitting: Neurosurgery

## 2020-11-02 DIAGNOSIS — G911 Obstructive hydrocephalus: Secondary | ICD-10-CM

## 2020-11-17 ENCOUNTER — Other Ambulatory Visit: Payer: Self-pay

## 2020-11-17 ENCOUNTER — Ambulatory Visit
Admission: RE | Admit: 2020-11-17 | Discharge: 2020-11-17 | Disposition: A | Discharge status: Home / Self Care | Payer: Medicare Other | Source: Ambulatory Visit | Attending: Neurosurgery | Admitting: Neurosurgery

## 2020-11-17 DIAGNOSIS — G911 Obstructive hydrocephalus: Secondary | ICD-10-CM | POA: Diagnosis not present

## 2020-11-17 MED ORDER — GADOBUTROL 1 MMOL/ML IV SOLN
7.5000 mL | Freq: Once | INTRAVENOUS | Status: AC | PRN
  Administered 2020-11-17: 7.5 mL via INTRAVENOUS

## 2020-12-11 ENCOUNTER — Other Ambulatory Visit: Payer: Self-pay | Admitting: Neurosurgery

## 2021-01-01 ENCOUNTER — Encounter
Admission: RE | Admit: 2021-01-01 | Discharge: 2021-01-01 | Disposition: A | Discharge status: Home / Self Care | Payer: Medicare Other | Source: Ambulatory Visit | Attending: Neurosurgery | Admitting: Neurosurgery

## 2021-01-01 ENCOUNTER — Other Ambulatory Visit: Payer: Self-pay

## 2021-01-01 DIAGNOSIS — Z01818 Encounter for other preprocedural examination: Secondary | ICD-10-CM | POA: Insufficient documentation

## 2021-01-01 DIAGNOSIS — I10 Essential (primary) hypertension: Secondary | ICD-10-CM | POA: Insufficient documentation

## 2021-01-01 HISTORY — DX: Essential (primary) hypertension: I10

## 2021-01-01 HISTORY — DX: Unsteadiness on feet: R26.81

## 2021-01-01 HISTORY — DX: Unspecified osteoarthritis, unspecified site: M19.90

## 2021-01-01 HISTORY — DX: Unspecified fall, initial encounter: W19.XXXA

## 2021-01-01 HISTORY — DX: Hydrocephalus, unspecified: G91.9

## 2021-01-01 LAB — CBC
HCT: 38.1 % — ABNORMAL LOW (ref 39.0–52.0)
Hemoglobin: 13.5 g/dL (ref 13.0–17.0)
MCH: 32.6 pg (ref 26.0–34.0)
MCHC: 35.4 g/dL (ref 30.0–36.0)
MCV: 92 fL (ref 80.0–100.0)
Platelets: 284 10*3/uL (ref 150–400)
RBC: 4.14 MIL/uL — ABNORMAL LOW (ref 4.22–5.81)
RDW: 11.6 % (ref 11.5–15.5)
WBC: 5.5 10*3/uL (ref 4.0–10.5)
nRBC: 0 % (ref 0.0–0.2)

## 2021-01-01 LAB — BASIC METABOLIC PANEL
Anion gap: 7 (ref 5–15)
BUN: 14 mg/dL (ref 8–23)
CO2: 27 mmol/L (ref 22–32)
Calcium: 8.3 mg/dL — ABNORMAL LOW (ref 8.9–10.3)
Chloride: 99 mmol/L (ref 98–111)
Creatinine, Ser: 0.77 mg/dL (ref 0.61–1.24)
GFR, Estimated: >60 mL/min (ref >60)
Glucose, Bld: 148 mg/dL — ABNORMAL HIGH (ref 70–99)
Potassium: 3.4 mmol/L — ABNORMAL LOW (ref 3.5–5.1)
Sodium: 133 mmol/L — ABNORMAL LOW (ref 135–145)

## 2021-01-01 LAB — URINALYSIS, ROUTINE W REFLEX MICROSCOPIC
Bilirubin Urine: NEGATIVE
Glucose, UA: NEGATIVE mg/dL
Hgb urine dipstick: NEGATIVE
Ketones, ur: NEGATIVE mg/dL
Leukocytes,Ua: NEGATIVE
Nitrite: NEGATIVE
Protein, ur: NEGATIVE mg/dL
Specific Gravity, Urine: 1.01 (ref 1.005–1.030)
pH: 6 (ref 5.0–8.0)

## 2021-01-01 LAB — TYPE AND SCREEN
ABO/RH(D): A POS
Antibody Screen: NEGATIVE

## 2021-01-01 LAB — SURGICAL PCR SCREEN
MRSA, PCR: NEGATIVE
Staphylococcus aureus: NEGATIVE

## 2021-01-01 NOTE — Patient Instructions (Addendum)
Your procedure is scheduled on:01-08-21 Monday Report to the Registration Desk on the 1st floor of the [facility]-Then proceed to the 2nd floor Surgery Desk in the [place] To find out your arrival time, please call [phone] between 1PM - 3PM on:01-05-21 Friday  REMEMBER: Instructions that are not followed completely may result in serious medical risk, up to and including death; or upon the discretion of your surgeon and anesthesiologist your surgery may need to be rescheduled.  Do not eat food after midnight the night before surgery.  No gum chewing, lozengers or hard candies.  You may however, drink CLEAR liquids up to 2 hours before you are scheduled to arrive for your surgery. Do not drink anything within 2 hours of your scheduled arrival time.  Clear liquids include: - water  - apple juice without pulp - gatorade (not RED, PURPLE, OR BLUE) - black coffee or tea (Do NOT add milk or creamers to the coffee or tea) Do NOT drink anything that is not on this list.  TAKE THESE MEDICATIONS THE MORNING OF SURGERY WITH A SIP OF WATER: -Omeprazole (Prilosec)-take one the night before and one on the morning of surgery - helps to prevent nausea after surgery.)  Stop your 81 mg Aspirin 7 days prior to surgery as instructed by Dr Jonathon Jordan office-Last dose on 12-31-20 Sunday  Stop Pepto Bismol 7 days prior to Surgery  One week prior to surgery: Stop Anti-inflammatories (NSAIDS) such as Advil, Aleve, Ibuprofen, Motrin, Naproxen, Naprosyn and Aspirin based products such as Excedrin, Goodys Powder, BC Powder.You may however, continue to take Tylenol if needed for pain up until the day of surgery.  Stop ANY OVER THE COUNTER supplements/vitamins NOW 01-01-21 until after surgery (Melatonin and Multivitamin)  No Alcohol for 24 hours before or after surgery.  No Smoking including e-cigarettes for 24 hours prior to surgery.  No chewable tobacco products for at least 6 hours prior to surgery.  No  nicotine patches on the day of surgery.  Do not use any "recreational" drugs for at least a week prior to your surgery.  Please be advised that the combination of cocaine and anesthesia may have negative outcomes, up to and including death. If you test positive for cocaine, your surgery will be cancelled.  On the morning of surgery brush your teeth with toothpaste and water, you may rinse your mouth with mouthwash if you wish. Do not swallow any toothpaste or mouthwash.  Do not wear jewelry, make-up, hairpins, clips or nail polish.  Do not wear lotions, powders, or perfumes.   Do not shave body from the neck down 48 hours prior to surgery just in case you cut yourself which could leave a site for infection.  Also, freshly shaved skin may become irritated if using the CHG soap.  Contact lenses, hearing aids and dentures may not be worn into surgery.  Do not bring valuables to the hospital. [facility] is not responsible for any missing/lost belongings or valuables.   Use CHG Soap as directed on instruction sheet.  Notify your doctor if there is any change in your medical condition (cold, fever, infection).  Wear comfortable clothing (specific to your surgery type) to the hospital.  After surgery, you can help prevent lung complications by doing breathing exercises.  Take deep breaths and cough every 1-2 hours. Your doctor may order a device called an Incentive Spirometer to help you take deep breaths. When coughing or sneezing, hold a pillow firmly against your incision with  both hands. This is called "splinting." Doing this helps protect your incision. It also decreases belly discomfort.  If you are being admitted to the hospital overnight, leave your suitcase in the car. After surgery it may be brought to your room.  If you are being discharged the day of surgery, you will not be allowed to drive home. You will need a responsible adult (18 years or older) to drive you home and stay  with you that night.   If you are taking public transportation, you will need to have a responsible adult (18 years or older) with you. Please confirm with your physician that it is acceptable to use public transportation.   Please call the [facility] at [phone] if you have any questions about these instructions.  Surgery Visitation Policy:  Patients undergoing a surgery or procedure may have one family member or support person with them as long as that person is not COVID-19 positive or experiencing its symptoms.  That person may remain in the waiting area during the procedure.  Inpatient Visitation:    Visiting hours are 7 a.m. to 8 p.m. Inpatients will be allowed two visitors daily. The visitors may change each day during the patient's stay. No visitors under the age of 85. Any visitor under the age of 22 must be accompanied by an adult. The visitor must pass COVID-19 screenings, use hand sanitizer when entering and exiting the patient's room and wear a mask at all times, including in the patient's room. Patients must also wear a mask when staff or their visitor are in the room. Masking is required regardless of vaccination status.

## 2021-01-04 ENCOUNTER — Other Ambulatory Visit
Admission: RE | Admit: 2021-01-04 | Discharge: 2021-01-04 | Disposition: A | Discharge status: Home / Self Care | Payer: Medicare Other | Source: Ambulatory Visit | Attending: Neurosurgery | Admitting: Neurosurgery

## 2021-01-04 ENCOUNTER — Other Ambulatory Visit: Payer: Self-pay

## 2021-01-04 DIAGNOSIS — Z01812 Encounter for preprocedural laboratory examination: Secondary | ICD-10-CM | POA: Insufficient documentation

## 2021-01-04 DIAGNOSIS — Z20822 Contact with and (suspected) exposure to covid-19: Secondary | ICD-10-CM | POA: Diagnosis not present

## 2021-01-04 LAB — SARS CORONAVIRUS 2 (TAT 6-24 HRS): SARS Coronavirus 2: NEGATIVE

## 2021-01-07 MED ORDER — LACTATED RINGERS IV SOLN: INTRAVENOUS | Status: DC

## 2021-01-07 MED ORDER — CHLORHEXIDINE GLUCONATE 0.12 % MT SOLN: 15.0000 mL | Freq: Once | OROMUCOSAL | Status: AC

## 2021-01-07 MED ORDER — ORAL CARE MOUTH RINSE: 15.0000 mL | Freq: Once | OROMUCOSAL | Status: AC

## 2021-01-07 MED ORDER — CEFAZOLIN SODIUM-DEXTROSE 2-4 GM/100ML-% IV SOLN
2.0000 g | INTRAVENOUS | Status: AC
  Administered 2021-01-08: 2 g via INTRAVENOUS

## 2021-01-07 NOTE — Progress Notes (Signed)
Pharmacy Antibiotic Note  Eric [name] is a 75 y.o. male admitted on (Not on file) with surgical prophylaxis.  Pharmacy has been consulted for Cefazolin dosing.  Plan: Cefazolin 2 gm IV X 1 60 min pre-op ordered for 7/25 @ 0500.      No data recorded.  Recent Labs  Lab 01/01/21 1359  WBC 5.5  CREATININE 0.77    Estimated Creatinine Clearance: 84.6 mL/min (by C-G formula based on SCr of 0.77 mg/dL).    No Known Allergies  Antimicrobials this admission:   >>    >>   Dose adjustments this admission:   Microbiology results:  BCx:   UCx:    Sputum:    MRSA PCR:   Thank you for allowing pharmacy to be a part of this patient's care.  Jalisia Puchalski D 01/07/2021 11:14 PM

## 2021-01-08 ENCOUNTER — Encounter
Admission: RE | Disposition: A | Discharge status: Home Health | Payer: Self-pay | Source: Home / Self Care | Attending: Neurosurgery

## 2021-01-08 ENCOUNTER — Inpatient Hospital Stay: Payer: Medicare Other | Admitting: Urgent Care

## 2021-01-08 ENCOUNTER — Inpatient Hospital Stay
Admission: RE | Admit: 2021-01-08 | Discharge: 2021-01-31 | DRG: 031 | Disposition: A | Discharge status: Home Health | Payer: Medicare Other | Attending: Neurosurgery | Admitting: Neurosurgery

## 2021-01-08 ENCOUNTER — Other Ambulatory Visit: Payer: Self-pay

## 2021-01-08 ENCOUNTER — Inpatient Hospital Stay: Payer: Medicare Other

## 2021-01-08 ENCOUNTER — Inpatient Hospital Stay: Payer: Medicare Other | Admitting: Anesthesiology

## 2021-01-08 ENCOUNTER — Encounter: Payer: Self-pay | Admitting: Neurosurgery

## 2021-01-08 DIAGNOSIS — G4700 Insomnia, unspecified: Secondary | ICD-10-CM | POA: Diagnosis present

## 2021-01-08 DIAGNOSIS — F419 Anxiety disorder, unspecified: Secondary | ICD-10-CM | POA: Diagnosis present

## 2021-01-08 DIAGNOSIS — E785 Hyperlipidemia, unspecified: Secondary | ICD-10-CM | POA: Diagnosis present

## 2021-01-08 DIAGNOSIS — N400 Enlarged prostate without lower urinary tract symptoms: Secondary | ICD-10-CM | POA: Diagnosis present

## 2021-01-08 DIAGNOSIS — G912 (Idiopathic) normal pressure hydrocephalus: Secondary | ICD-10-CM | POA: Diagnosis present

## 2021-01-08 DIAGNOSIS — Z781 Physical restraint status: Secondary | ICD-10-CM

## 2021-01-08 DIAGNOSIS — Z978 Presence of other specified devices: Secondary | ICD-10-CM | POA: Diagnosis not present

## 2021-01-08 DIAGNOSIS — R2689 Other abnormalities of gait and mobility: Secondary | ICD-10-CM | POA: Diagnosis present

## 2021-01-08 DIAGNOSIS — K219 Gastro-esophageal reflux disease without esophagitis: Secondary | ICD-10-CM | POA: Diagnosis present

## 2021-01-08 DIAGNOSIS — D72829 Elevated white blood cell count, unspecified: Secondary | ICD-10-CM | POA: Diagnosis present

## 2021-01-08 DIAGNOSIS — F05 Delirium due to known physiological condition: Secondary | ICD-10-CM | POA: Diagnosis not present

## 2021-01-08 DIAGNOSIS — R509 Fever, unspecified: Secondary | ICD-10-CM

## 2021-01-08 DIAGNOSIS — G911 Obstructive hydrocephalus: Secondary | ICD-10-CM | POA: Diagnosis present

## 2021-01-08 DIAGNOSIS — Z982 Presence of cerebrospinal fluid drainage device: Secondary | ICD-10-CM | POA: Diagnosis not present

## 2021-01-08 DIAGNOSIS — Z9181 History of falling: Secondary | ICD-10-CM | POA: Diagnosis not present

## 2021-01-08 DIAGNOSIS — G919 Hydrocephalus, unspecified: Secondary | ICD-10-CM | POA: Diagnosis present

## 2021-01-08 DIAGNOSIS — Z79899 Other long term (current) drug therapy: Secondary | ICD-10-CM

## 2021-01-08 DIAGNOSIS — R059 Cough, unspecified: Secondary | ICD-10-CM

## 2021-01-08 DIAGNOSIS — E871 Hypo-osmolality and hyponatremia: Secondary | ICD-10-CM | POA: Diagnosis present

## 2021-01-08 DIAGNOSIS — M1990 Unspecified osteoarthritis, unspecified site: Secondary | ICD-10-CM | POA: Diagnosis present

## 2021-01-08 DIAGNOSIS — D179 Benign lipomatous neoplasm, unspecified: Secondary | ICD-10-CM | POA: Diagnosis present

## 2021-01-08 DIAGNOSIS — E876 Hypokalemia: Secondary | ICD-10-CM | POA: Diagnosis present

## 2021-01-08 DIAGNOSIS — F29 Unspecified psychosis not due to a substance or known physiological condition: Secondary | ICD-10-CM | POA: Diagnosis not present

## 2021-01-08 DIAGNOSIS — I10 Essential (primary) hypertension: Secondary | ICD-10-CM | POA: Diagnosis present

## 2021-01-08 DIAGNOSIS — R1310 Dysphagia, unspecified: Secondary | ICD-10-CM | POA: Diagnosis present

## 2021-01-08 DIAGNOSIS — N472 Paraphimosis: Secondary | ICD-10-CM | POA: Diagnosis present

## 2021-01-08 DIAGNOSIS — Z87891 Personal history of nicotine dependence: Secondary | ICD-10-CM

## 2021-01-08 DIAGNOSIS — G918 Other hydrocephalus: Secondary | ICD-10-CM | POA: Diagnosis not present

## 2021-01-08 DIAGNOSIS — R4182 Altered mental status, unspecified: Secondary | ICD-10-CM | POA: Diagnosis not present

## 2021-01-08 DIAGNOSIS — E1169 Type 2 diabetes mellitus with other specified complication: Secondary | ICD-10-CM | POA: Diagnosis not present

## 2021-01-08 DIAGNOSIS — G910 Communicating hydrocephalus: Secondary | ICD-10-CM | POA: Diagnosis not present

## 2021-01-08 DIAGNOSIS — G9341 Metabolic encephalopathy: Secondary | ICD-10-CM | POA: Diagnosis present

## 2021-01-08 DIAGNOSIS — E119 Type 2 diabetes mellitus without complications: Secondary | ICD-10-CM | POA: Diagnosis present

## 2021-01-08 DIAGNOSIS — Z7982 Long term (current) use of aspirin: Secondary | ICD-10-CM

## 2021-01-08 DIAGNOSIS — G3109 Other frontotemporal dementia: Secondary | ICD-10-CM | POA: Diagnosis present

## 2021-01-08 DIAGNOSIS — D75839 Thrombocytosis, unspecified: Secondary | ICD-10-CM | POA: Diagnosis present

## 2021-01-08 DIAGNOSIS — Z515 Encounter for palliative care: Secondary | ICD-10-CM | POA: Diagnosis not present

## 2021-01-08 DIAGNOSIS — Z0189 Encounter for other specified special examinations: Secondary | ICD-10-CM

## 2021-01-08 DIAGNOSIS — K8020 Calculus of gallbladder without cholecystitis without obstruction: Secondary | ICD-10-CM

## 2021-01-08 DIAGNOSIS — N4889 Other specified disorders of penis: Secondary | ICD-10-CM | POA: Diagnosis not present

## 2021-01-08 DIAGNOSIS — Z7189 Other specified counseling: Secondary | ICD-10-CM | POA: Diagnosis not present

## 2021-01-08 DIAGNOSIS — F0280 Dementia in other diseases classified elsewhere without behavioral disturbance: Secondary | ICD-10-CM | POA: Diagnosis present

## 2021-01-08 DIAGNOSIS — E878 Other disorders of electrolyte and fluid balance, not elsewhere classified: Secondary | ICD-10-CM | POA: Diagnosis not present

## 2021-01-08 DIAGNOSIS — R410 Disorientation, unspecified: Secondary | ICD-10-CM | POA: Diagnosis not present

## 2021-01-08 HISTORY — PX: VENTRICULOPERITONEAL SHUNT: SHX204

## 2021-01-08 LAB — CBC
HCT: 35.8 % — ABNORMAL LOW (ref 39.0–52.0)
Hemoglobin: 13.1 g/dL (ref 13.0–17.0)
MCH: 33.5 pg (ref 26.0–34.0)
MCHC: 36.6 g/dL — ABNORMAL HIGH (ref 30.0–36.0)
MCV: 91.6 fL (ref 80.0–100.0)
Platelets: 227 10*3/uL (ref 150–400)
RBC: 3.91 MIL/uL — ABNORMAL LOW (ref 4.22–5.81)
RDW: 11.5 % (ref 11.5–15.5)
WBC: 8.4 10*3/uL (ref 4.0–10.5)
nRBC: 0 % (ref 0.0–0.2)

## 2021-01-08 LAB — CSF CELL COUNT WITH DIFFERENTIAL
Eosinophils, CSF: 0 %
Lymphs, CSF: 59 %
Monocyte-Macrophage-Spinal Fluid: 35 %
Other Cells, CSF: 0
RBC Count, CSF: 107 /mm3 — ABNORMAL HIGH (ref 0–3)
Segmented Neutrophils-CSF: 6 %
Tube #: 1
WBC, CSF: 9 /mm3 — ABNORMAL HIGH (ref 0–5)

## 2021-01-08 LAB — BASIC METABOLIC PANEL
Anion gap: 6 (ref 5–15)
BUN: 14 mg/dL (ref 8–23)
CO2: 25 mmol/L (ref 22–32)
Calcium: 7.9 mg/dL — ABNORMAL LOW (ref 8.9–10.3)
Chloride: 98 mmol/L (ref 98–111)
Creatinine, Ser: 0.84 mg/dL (ref 0.61–1.24)
GFR, Estimated: >60 mL/min (ref >60)
Glucose, Bld: 206 mg/dL — ABNORMAL HIGH (ref 70–99)
Potassium: 3.5 mmol/L (ref 3.5–5.1)
Sodium: 129 mmol/L — ABNORMAL LOW (ref 135–145)

## 2021-01-08 LAB — OSMOLALITY: Osmolality: 283 mOsm/kg (ref 275–295)

## 2021-01-08 LAB — ABO/RH: ABO/RH(D): A POS

## 2021-01-08 LAB — GLUCOSE, CSF: Glucose, CSF: 82 mg/dL — ABNORMAL HIGH (ref 40–70)

## 2021-01-08 LAB — URINALYSIS, COMPLETE (UACMP) WITH MICROSCOPIC
Bacteria, UA: NONE SEEN
Bilirubin Urine: NEGATIVE
Glucose, UA: 50 mg/dL — AB
Ketones, ur: NEGATIVE mg/dL
Leukocytes,Ua: NEGATIVE
Nitrite: NEGATIVE
Protein, ur: NEGATIVE mg/dL
RBC / HPF: >50 RBC/hpf — ABNORMAL HIGH (ref 0–5)
Specific Gravity, Urine: 1.018 (ref 1.005–1.030)
pH: 6 (ref 5.0–8.0)

## 2021-01-08 LAB — SODIUM
Sodium: 131 mmol/L — ABNORMAL LOW (ref 135–145)
Sodium: 134 mmol/L — ABNORMAL LOW (ref 135–145)

## 2021-01-08 LAB — SODIUM, URINE, RANDOM: Sodium, Ur: 152 mmol/L

## 2021-01-08 LAB — MRSA NEXT GEN BY PCR, NASAL: MRSA by PCR Next Gen: NOT DETECTED

## 2021-01-08 LAB — OSMOLALITY, URINE: Osmolality, Ur: 586 mOsm/kg (ref 300–900)

## 2021-01-08 LAB — PROTEIN, CSF: Total  Protein, CSF: 27 mg/dL (ref 15–45)

## 2021-01-08 SURGERY — SHUNT INSERTION VENTRICULAR-PERITONEAL
Anesthesia: General | Laterality: Right

## 2021-01-08 MED ORDER — CHLORHEXIDINE GLUCONATE 0.12 % MT SOLN
OROMUCOSAL | Status: AC
  Administered 2021-01-08: 15 mL via OROMUCOSAL
  Filled 2021-01-08: qty 15

## 2021-01-08 MED ORDER — BISMUTH SUBSALICYLATE 262 MG/15ML PO SUSP
30.0000 mL | Freq: Four times a day (QID) | ORAL | Status: DC | PRN
  Filled 2021-01-08: qty 118

## 2021-01-08 MED ORDER — MELATONIN 5 MG PO TABS
10.0000 mg | ORAL_TABLET | Freq: Every evening | ORAL | Status: DC
  Administered 2021-01-12 – 2021-01-14 (×3): 10 mg via ORAL
  Filled 2021-01-08 (×3): qty 2

## 2021-01-08 MED ORDER — HALOPERIDOL LACTATE 5 MG/ML IJ SOLN
2.5000 mg | Freq: Four times a day (QID) | INTRAMUSCULAR | Status: DC | PRN
Start: 2021-01-08 — End: 2021-01-08

## 2021-01-08 MED ORDER — LIDOCAINE HCL (CARDIAC) PF 100 MG/5ML IV SOSY
PREFILLED_SYRINGE | INTRAVENOUS | Status: DC | PRN
  Administered 2021-01-08: 100 mg via INTRAVENOUS

## 2021-01-08 MED ORDER — SODIUM CHLORIDE FLUSH 0.9 % IV SOLN
INTRAVENOUS | Status: AC
  Filled 2021-01-08: qty 10

## 2021-01-08 MED ORDER — LIDOCAINE-EPINEPHRINE 1 %-1:100000 IJ SOLN
INTRAMUSCULAR | Status: DC | PRN
  Administered 2021-01-08: 17 mL

## 2021-01-08 MED ORDER — SODIUM CHLORIDE 0.9 % IV SOLN: INTRAVENOUS | Status: DC

## 2021-01-08 MED ORDER — OXYCODONE HCL 5 MG PO TABS
5.0000 mg | ORAL_TABLET | Freq: Once | ORAL | Status: DC | PRN
Start: 2021-01-08 — End: 2021-01-08

## 2021-01-08 MED ORDER — LISINOPRIL-HYDROCHLOROTHIAZIDE 20-25 MG PO TABS: 1.0000 | ORAL_TABLET | ORAL | Status: DC

## 2021-01-08 MED ORDER — OXYCODONE HCL 5 MG/5ML PO SOLN: 5.0000 mg | Freq: Once | ORAL | Status: DC | PRN

## 2021-01-08 MED ORDER — ACETAMINOPHEN 10 MG/ML IV SOLN: 1000.0000 mg | Freq: Once | INTRAVENOUS | Status: DC | PRN

## 2021-01-08 MED ORDER — DIPHENHYDRAMINE HCL (SLEEP) 50 MG/30ML PO LIQD: 30.0000 mL | Freq: Every day | ORAL | Status: DC | PRN

## 2021-01-08 MED ORDER — PROPOFOL 10 MG/ML IV BOLUS
INTRAVENOUS | Status: DC | PRN
  Administered 2021-01-08: 20 mg via INTRAVENOUS
  Administered 2021-01-08: 120 mg via INTRAVENOUS

## 2021-01-08 MED ORDER — FENTANYL CITRATE (PF) 100 MCG/2ML IJ SOLN
INTRAMUSCULAR | Status: AC
  Administered 2021-01-08: 25 ug via INTRAVENOUS
  Filled 2021-01-08: qty 2

## 2021-01-08 MED ORDER — FENTANYL CITRATE (PF) 100 MCG/2ML IJ SOLN
INTRAMUSCULAR | Status: DC | PRN
  Administered 2021-01-08 (×3): 50 ug via INTRAVENOUS

## 2021-01-08 MED ORDER — TAMSULOSIN HCL 0.4 MG PO CAPS
0.4000 mg | ORAL_CAPSULE | Freq: Every day | ORAL | Status: DC
  Administered 2021-01-13 – 2021-01-30 (×17): 0.4 mg via ORAL
  Filled 2021-01-08 (×19): qty 1

## 2021-01-08 MED ORDER — SUGAMMADEX SODIUM 200 MG/2ML IV SOLN
INTRAVENOUS | Status: DC | PRN
  Administered 2021-01-08: 200 mg via INTRAVENOUS

## 2021-01-08 MED ORDER — HALOPERIDOL LACTATE 5 MG/ML IJ SOLN
INTRAMUSCULAR | Status: AC
  Administered 2021-01-08: 2.5 mg
  Filled 2021-01-08: qty 1

## 2021-01-08 MED ORDER — FENTANYL CITRATE (PF) 250 MCG/5ML IJ SOLN
INTRAMUSCULAR | Status: AC
  Filled 2021-01-08: qty 5

## 2021-01-08 MED ORDER — DEXMEDETOMIDINE HCL IN NACL 400 MCG/100ML IV SOLN
0.4000 ug/kg/h | INTRAVENOUS | Status: DC
  Administered 2021-01-08: 0.4 ug/kg/h via INTRAVENOUS
  Administered 2021-01-08: 0.6 ug/kg/h via INTRAVENOUS
  Administered 2021-01-09: 0.9 ug/kg/h via INTRAVENOUS
  Administered 2021-01-09: 1.2 ug/kg/h via INTRAVENOUS
  Administered 2021-01-09 (×2): 0.8 ug/kg/h via INTRAVENOUS
  Administered 2021-01-09: 0.6 ug/kg/h via INTRAVENOUS
  Administered 2021-01-10: 0.9 ug/kg/h via INTRAVENOUS
  Administered 2021-01-10: 1.2 ug/kg/h via INTRAVENOUS
  Administered 2021-01-10: 1.5 ug/kg/h via INTRAVENOUS
  Administered 2021-01-10 – 2021-01-11 (×2): 1.2 ug/kg/h via INTRAVENOUS
  Administered 2021-01-11: 1 ug/kg/h via INTRAVENOUS
  Administered 2021-01-11: 1.2 ug/kg/h via INTRAVENOUS
  Administered 2021-01-11: 0.8 ug/kg/h via INTRAVENOUS
  Administered 2021-01-11 – 2021-01-12 (×2): 1.2 ug/kg/h via INTRAVENOUS
  Administered 2021-01-12: 1 ug/kg/h via INTRAVENOUS
  Filled 2021-01-08 (×18): qty 100

## 2021-01-08 MED ORDER — ONDANSETRON HCL 4 MG/2ML IJ SOLN
INTRAMUSCULAR | Status: DC | PRN
  Administered 2021-01-08: 4 mg via INTRAVENOUS

## 2021-01-08 MED ORDER — ACETAMINOPHEN 650 MG RE SUPP: 650.0000 mg | RECTAL | Status: DC | PRN

## 2021-01-08 MED ORDER — ONDANSETRON HCL 4 MG PO TABS
4.0000 mg | ORAL_TABLET | ORAL | Status: DC | PRN
Start: 2021-01-08 — End: 2021-01-31

## 2021-01-08 MED ORDER — PROMETHAZINE HCL 25 MG PO TABS
12.5000 mg | ORAL_TABLET | ORAL | Status: DC | PRN
  Filled 2021-01-08: qty 1

## 2021-01-08 MED ORDER — ACETAMINOPHEN 325 MG PO TABS
650.0000 mg | ORAL_TABLET | ORAL | Status: DC | PRN
  Administered 2021-01-13 – 2021-01-28 (×9): 650 mg via ORAL
  Filled 2021-01-08 (×9): qty 2

## 2021-01-08 MED ORDER — ONDANSETRON HCL 4 MG/2ML IJ SOLN
4.0000 mg | INTRAMUSCULAR | Status: DC | PRN
Start: 2021-01-08 — End: 2021-01-31

## 2021-01-08 MED ORDER — HYDROCHLOROTHIAZIDE 25 MG PO TABS
25.0000 mg | ORAL_TABLET | Freq: Every day | ORAL | Status: DC
  Administered 2021-01-13 – 2021-01-15 (×3): 25 mg via ORAL
  Filled 2021-01-08 (×4): qty 1

## 2021-01-08 MED ORDER — ROCURONIUM BROMIDE 100 MG/10ML IV SOLN
INTRAVENOUS | Status: DC | PRN
  Administered 2021-01-08: 20 mg via INTRAVENOUS
  Administered 2021-01-08: 10 mg via INTRAVENOUS
  Administered 2021-01-08: 50 mg via INTRAVENOUS

## 2021-01-08 MED ORDER — STERILE WATER FOR IRRIGATION IR SOLN
Status: DC | PRN
  Administered 2021-01-08: 1000 mL

## 2021-01-08 MED ORDER — LABETALOL HCL 5 MG/ML IV SOLN: 10.0000 mg | INTRAVENOUS | Status: DC | PRN

## 2021-01-08 MED ORDER — ACETAMINOPHEN 10 MG/ML IV SOLN
INTRAVENOUS | Status: AC
  Administered 2021-01-08: 1000 mg via INTRAVENOUS
  Filled 2021-01-08: qty 100

## 2021-01-08 MED ORDER — HEMOSTATIC AGENTS (NO CHARGE) OPTIME
TOPICAL | Status: DC | PRN
  Administered 2021-01-08 (×2): 1 via TOPICAL

## 2021-01-08 MED ORDER — CHLORHEXIDINE GLUCONATE CLOTH 2 % EX PADS
6.0000 | MEDICATED_PAD | Freq: Every day | CUTANEOUS | Status: DC
  Administered 2021-01-11 – 2021-01-29 (×17): 6 via TOPICAL

## 2021-01-08 MED ORDER — LISINOPRIL 20 MG PO TABS
20.0000 mg | ORAL_TABLET | Freq: Every day | ORAL | Status: DC
  Administered 2021-01-13 – 2021-01-15 (×3): 20 mg via ORAL
  Filled 2021-01-08 (×4): qty 1

## 2021-01-08 MED ORDER — ONDANSETRON HCL 4 MG/2ML IJ SOLN: 4.0000 mg | Freq: Once | INTRAMUSCULAR | Status: DC | PRN

## 2021-01-08 MED ORDER — CEFAZOLIN SODIUM-DEXTROSE 2-4 GM/100ML-% IV SOLN
INTRAVENOUS | Status: AC
  Filled 2021-01-08: qty 100

## 2021-01-08 MED ORDER — BISACODYL 5 MG PO TBEC: 5.0000 mg | DELAYED_RELEASE_TABLET | Freq: Every day | ORAL | Status: DC | PRN

## 2021-01-08 MED ORDER — DIPHENHYDRAMINE HCL 12.5 MG/5ML PO ELIX
25.0000 mg | ORAL_SOLUTION | Freq: Every evening | ORAL | Status: DC | PRN
  Filled 2021-01-08: qty 10

## 2021-01-08 MED ORDER — FENTANYL CITRATE (PF) 100 MCG/2ML IJ SOLN
25.0000 ug | INTRAMUSCULAR | Status: DC | PRN
  Administered 2021-01-08 (×2): 25 ug via INTRAVENOUS

## 2021-01-08 MED ORDER — SENNA 8.6 MG PO TABS
1.0000 | ORAL_TABLET | Freq: Two times a day (BID) | ORAL | Status: DC
  Administered 2021-01-12 – 2021-01-13 (×3): 8.6 mg via ORAL
  Filled 2021-01-08 (×3): qty 1

## 2021-01-08 MED ORDER — DEXAMETHASONE SODIUM PHOSPHATE 10 MG/ML IJ SOLN
INTRAMUSCULAR | Status: DC | PRN
  Administered 2021-01-08: 5 mg via INTRAVENOUS

## 2021-01-08 MED ORDER — PROPOFOL 10 MG/ML IV BOLUS
INTRAVENOUS | Status: AC
  Filled 2021-01-08: qty 20

## 2021-01-08 MED ORDER — PANTOPRAZOLE SODIUM 40 MG PO TBEC: 40.0000 mg | DELAYED_RELEASE_TABLET | Freq: Every day | ORAL | Status: DC

## 2021-01-08 MED ORDER — FINASTERIDE 5 MG PO TABS
5.0000 mg | ORAL_TABLET | Freq: Every evening | ORAL | Status: DC
  Administered 2021-01-12 – 2021-01-30 (×19): 5 mg via ORAL
  Filled 2021-01-08 (×19): qty 1

## 2021-01-08 MED ORDER — DEXMEDETOMIDINE (PRECEDEX) IN NS 20 MCG/5ML (4 MCG/ML) IV SYRINGE
PREFILLED_SYRINGE | INTRAVENOUS | Status: DC | PRN
  Administered 2021-01-08: 8 ug via INTRAVENOUS
  Administered 2021-01-08: 20 ug via INTRAVENOUS

## 2021-01-08 SURGICAL SUPPLY — 81 items
BLADE CLIPPER SPEC (BLADE) ×2 IMPLANT
BLADE CLIPPER SURG (BLADE) ×2 IMPLANT
BLADE SURG 15 STRL LF DISP TIS (BLADE) IMPLANT
BLADE SURG 15 STRL SS (BLADE)
BOOT SUTURE AID YELLOW STND (SUTURE) ×2 IMPLANT
BUR ACORN 7.5 PRECISION (BURR) IMPLANT
CHLORAPREP W/TINT 26 (MISCELLANEOUS) ×6 IMPLANT
COUNTER NEEDLE 20/40 LG (NEEDLE) ×2 IMPLANT
COVER LIGHT HANDLE STERIS (MISCELLANEOUS) ×4 IMPLANT
DEFOGGER ANTIFOG KIT (MISCELLANEOUS) IMPLANT
DERMABOND ADVANCED (GAUZE/BANDAGES/DRESSINGS) ×2
DERMABOND ADVANCED .7 DNX12 (GAUZE/BANDAGES/DRESSINGS) ×2 IMPLANT
DRAPE INCISE IOBAN 66X45 STRL (DRAPES) ×2 IMPLANT
DRAPE LAPAROTOMY 100X77 ABD (DRAPES) IMPLANT
DRAPE POUCH INSTRU U-SHP 10X18 (DRAPES) IMPLANT
DRAPE SURG 17X11 SM STRL (DRAPES) ×20 IMPLANT
DRAPE SURG IRRIG POUCH 19X23 (DRAPES) IMPLANT
DRAPE WARM FLUID 44X44 (DRAPES) ×2 IMPLANT
DRSG TEGADERM 4X4.75 (GAUZE/BANDAGES/DRESSINGS) ×2 IMPLANT
DRSG TELFA 4X3 1S NADH ST (GAUZE/BANDAGES/DRESSINGS) ×4 IMPLANT
ELECT CAUTERY BLADE TIP 2.5 (TIP) ×2
ELECT COATED BLADE 2.86 ST (ELECTRODE) ×2 IMPLANT
ELECT REM PT RETURN 9FT ADLT (ELECTROSURGICAL) ×2
ELECTRODE CAUTERY BLDE TIP 2.5 (TIP) ×1 IMPLANT
ELECTRODE REM PT RTRN 9FT ADLT (ELECTROSURGICAL) ×1 IMPLANT
GAUZE 4X4 16PLY ~~LOC~~+RFID DBL (SPONGE) ×10 IMPLANT
GAUZE SPONGE 4X4 12PLY STRL (GAUZE/BANDAGES/DRESSINGS) ×2 IMPLANT
GAUZE XEROFORM 1X8 LF (GAUZE/BANDAGES/DRESSINGS) ×2 IMPLANT
GLOVE SURG SYN 6.5 ES PF (GLOVE) ×4 IMPLANT
GLOVE SURG SYN 8.5  E (GLOVE) ×3
GLOVE SURG SYN 8.5 E (GLOVE) ×3 IMPLANT
GOWN SRG XL LVL 3 NONREINFORCE (GOWNS) IMPLANT
GOWN STRL NON-REIN TWL XL LVL3 (GOWNS)
GOWN STRL REUS W/ TWL LRG LVL3 (GOWN DISPOSABLE) ×1 IMPLANT
GOWN STRL REUS W/TWL LRG LVL3 (GOWN DISPOSABLE) ×1
GRADUATE 1200CC STRL 31836 (MISCELLANEOUS) ×2 IMPLANT
HEMOSTAT SURGICEL 2X14 (HEMOSTASIS) ×2 IMPLANT
KIT TURNOVER KIT A (KITS) ×2 IMPLANT
L-HOOK LAP DISP 36CM (ELECTROSURGICAL)
LHOOK LAP DISP 36CM (ELECTROSURGICAL) IMPLANT
MANIFOLD NEPTUNE II (INSTRUMENTS) ×2 IMPLANT
MARKER SKIN DUAL TIP RULER LAB (MISCELLANEOUS) ×4 IMPLANT
MAT ABSORB  FLUID 56X50 GRAY (MISCELLANEOUS) ×1
MAT ABSORB FLUID 56X50 GRAY (MISCELLANEOUS) ×1 IMPLANT
NDL INSUFF ACCESS 14 VERSASTEP (NEEDLE) IMPLANT
NEEDLE HYPO 22GX1.5 SAFETY (NEEDLE) ×2 IMPLANT
NEEDLE INSUFFLATION 14GA 120MM (NEEDLE) IMPLANT
NS IRRIG 1000ML POUR BTL (IV SOLUTION) ×4 IMPLANT
PACK CRANIOTOMY CUSTOM (CUSTOM PROCEDURE TRAY) ×2 IMPLANT
PACK HEAD/NECK (MISCELLANEOUS) IMPLANT
PACK LAMINECTOMY NEURO (CUSTOM PROCEDURE TRAY) IMPLANT
PAD ARMBOARD 7.5X6 YLW CONV (MISCELLANEOUS) ×4 IMPLANT
PASSER CATH 65CM DISP (NEUROSURGERY SUPPLIES) ×2 IMPLANT
PASSER CATH SHUNT 55CM (INSTRUMENTS) IMPLANT
PERFORATOR LRG  14-11MM (BIT) ×1
PERFORATOR LRG 14-11MM (BIT) ×1 IMPLANT
SET TUBE SMOKE EVAC HIGH FLOW (TUBING) IMPLANT
SHEATH PERITONEAL INTRO 61 (SHEATH) IMPLANT
SHEET NEURO XL SOL CTL (MISCELLANEOUS) IMPLANT
SLEEVE ENDOPATH XCEL 5M (ENDOMECHANICALS) IMPLANT
STAPLER SKIN PROX 35W (STAPLE) ×2 IMPLANT
STYLET DISP PRECALIBRATE (MISCELLANEOUS) IMPLANT
SURGIFLO W/THROMBIN 8M KIT (HEMOSTASIS) ×2 IMPLANT
SUT ETHILON 3-0 FS-10 30 BLK (SUTURE) ×2
SUT MNCRL 4-0 (SUTURE) ×2
SUT MNCRL 4-0 27XMFL (SUTURE) ×2
SUT MNCRL AB 3-0 PS2 27 (SUTURE) IMPLANT
SUT POLYSORB 2-0 5X18 GS-10 (SUTURE) ×10 IMPLANT
SUT POLYSORB 3-0 18 V-20 (SUTURE) IMPLANT
SUT SILK 2 0 (SUTURE) ×1
SUT SILK 2-0 18XBRD TIE 12 (SUTURE) ×1 IMPLANT
SUT VICRYL 2-0 SH 8X27 (SUTURE) ×4 IMPLANT
SUTURE EHLN 3-0 FS-10 30 BLK (SUTURE) ×1 IMPLANT
SUTURE MNCRL 4-0 27XMF (SUTURE) ×2 IMPLANT
TAPE CLOTH 3X10 WHT NS LF (GAUZE/BANDAGES/DRESSINGS) ×4 IMPLANT
TOWEL OR 17X26 4PK STRL BLUE (TOWEL DISPOSABLE) ×12 IMPLANT
TRAY FOLEY MTR SLVR 16FR STAT (SET/KITS/TRAYS/PACK) IMPLANT
TROCAR XCEL NON-BLD 5MMX100MML (ENDOMECHANICALS) IMPLANT
TUBING CONNECTING 10 (TUBING) ×2 IMPLANT
VALVE PROGRAM HAKIM  SYS (Valve) ×1 IMPLANT
VALVE PROGRAM HAKIM SYS (Valve) ×1 IMPLANT

## 2021-01-08 NOTE — H&P (Signed)
Eric [name] is an 75 y.o. male.   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 He did have a fall a few months ago and his wife does feel like his symptoms increased after this. He currently is having a shuffling type gait and has had more difficulty with balance issues. He was fairly active last year and has been unable to do similar activities this year. He is also dealing with some confusion and memory problems that has been worsening. He denies any headaches or vision changes. 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 There is concern for some increased hydrocephalus. Shunt was discussed with them and they would like to proceed  Past Medical History:  Diagnosis Date   Anxiety    Arthritis    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 symptoms    Diabetes ([place])    no meds-last a1c 5.7   Fall    GERD (gastroesophageal reflux disease)    HLD (hyperlipidemia)    Hydrocephalus ([facility])    Hypertension    Unsteady gait     Past Surgical History:  Procedure Laterality Date   arm surgery Left    fracture   COLONOSCOPY     TONSILLECTOMY  1962    Family History  Problem Relation Age of Onset   Prostate cancer Neg Hx    Kidney cancer Neg Hx    Bladder Cancer Neg Hx    Social History:  reports that he quit smoking about 15 years ago. His smoking use included cigarettes. He has a 84.00 pack-year smoking history. He has never used smokeless tobacco. He reports that he does not drink alcohol and does not use drugs.  Allergies: No Known Allergies  Medications Prior to Admission  Medication Sig Dispense Refill   aspirin EC 81 MG tablet Take 81 mg by mouth daily. Swallow whole.     diphenhydrAMINE HCl (ZZZQUIL) 50 MG/30ML LIQD Take 30 mLs by mouth daily as needed (Sleep).     finasteride (PROSCAR) 5 MG  tablet Take 1 tablet (5 mg total) by mouth daily. (Patient taking differently: Take 5 mg by mouth at bedtime.) 90 tablet 3   ibuprofen (ADVIL) 200 MG tablet Take 200 mg by mouth every 6 (six) hours as needed for mild pain or moderate pain.     lisinopril-hydrochlorothiazide (ZESTORETIC) 20-25 MG tablet Take 1 tablet by mouth every morning.     Melatonin 10 MG CAPS Take 10 mg by mouth at bedtime.     Multiple Vitamins-Minerals (CENTRUM SILVER ULTRA WOMENS) TABS Take 1 tablet by mouth daily.     omeprazole (PRILOSEC) 20 MG capsule Take 20 mg by mouth every other day. At bedtime     tamsulosin (FLOMAX) 0.4 MG CAPS capsule Take 1 capsule (0.4 mg total) by mouth daily. (Patient taking differently: Take 0.4 mg by mouth daily after supper.) 90 capsule 3   bismuth subsalicylate (PEPTO BISMOL) 262 MG/15ML suspension Take 30 mLs by mouth every 6 (six) hours as needed.      No results found for this or any previous visit (from the past 48 hour(s)). No results found.  Review of Systems General ROS: Negative Psychological ROS: Negative Ophthalmic ROS: Negative ENT ROS: Negative Hematological and Lymphatic ROS: Negative  Endocrine ROS: Negative Respiratory ROS: Negative Cardiovascular ROS: Negative Gastrointestinal  ROS: Negative Genito-Urinary ROS: Negative Musculoskeletal ROS: Negative Neurological ROS: Negative for headaches, positive for gait difficulty, balance difficulty, positive for memory disorder Dermatological ROS: Negative  Blood pressure (!) 152/82, pulse 91, temperature (!) 97.3 F (36.3 C), resp. rate 18, SpO2 98 %. Physical Exam  General appearance: Alert, cooperative, in no acute distress Head: Normocephalic, atraumatic Eyes: Normal, EOM intact Oropharynx: Wearing facemask Pulm: Normal effort Neck: Supple, range of motion appears full Ext: No edema in LE bilaterally  Neurologic exam:  Mental status: alertness: alert, orientation: knew hospital and year, did not know city and  month affect: normal Speech: fluent and clear, naming and repetition are intact Cranial nerves:  II: Visual fields are full by confrontation, no ptosis III/IV/VI: extra-ocular motions intact bilaterally V/VII:no evidence of facial droop or weakness  VIII: hearing normal XI: trapezius strength symmetric, sternocleidomastoid strength symmetric XII: tongue strength symmetric  Motor:strength symmetric 5/5, normal muscle mass and tone in all extremities and no pronator drift Sensory: intact to light touch in all extremities Gait: Narrow based, shuffling gait Assessment/Plan Proceed with right frontal VP shunt  Deetta Perla, MD 01/08/2021, 9:47 AM

## 2021-01-08 NOTE — Anesthesia Procedure Notes (Signed)
Procedure Name: Intubation Date/Time: 01/08/2021 10:36 AM Performed by: Fredderick Phenix, CRNA Pre-anesthesia Checklist: Patient identified, Emergency Drugs available, Suction available and Patient being monitored Patient Re-evaluated:Patient Re-evaluated prior to induction Oxygen Delivery Method: Circle system utilized Preoxygenation: Pre-oxygenation with 100% oxygen Induction Type: IV induction Ventilation: Mask ventilation without difficulty Laryngoscope Size: Mac and 4 Grade View: Grade I Tube type: Oral Tube size: 7.5 mm Number of attempts: 1 Airway Equipment and Method: Stylet and Oral airway Placement Confirmation: ETT inserted through vocal cords under direct vision, positive ETCO2 and breath sounds checked- equal and bilateral Secured at: 21 cm Tube secured with: Tape Dental Injury: Teeth and Oropharynx as per pre-operative assessment

## 2021-01-08 NOTE — Anesthesia Postprocedure Evaluation (Signed)
Anesthesia Post Note  Patient: Eric [name]  Procedure(s) Performed: RIGHT FRONTAL VENTRICULOPERITONEAL SHUNT INSERTION (Right)  Patient location during evaluation: PACU Anesthesia Type: General Level of consciousness: awake, patient uncooperative, confused and combative Pain management: pain level controlled Vital Signs Assessment: post-procedure vital signs reviewed and stable Respiratory status: spontaneous breathing, nonlabored ventilation, respiratory function stable and patient connected to nasal cannula oxygen Cardiovascular status: blood pressure returned to baseline and stable Postop Assessment: no apparent nausea or vomiting Anesthetic complications: no Comments: Going to ICU for monitoring   No notable events documented.   Last Vitals:  Vitals:   01/08/21 1245 01/08/21 1322  BP: (!) 105/55 (!) 159/83  Pulse: 72 (!) 117  Resp: 16 (!) 22  Temp:  (!) 36.3 C  SpO2: 100% 99%    Last Pain:  Vitals:   01/08/21 1230  PainSc: 0-No pain                 Iran Ouch

## 2021-01-08 NOTE — Addendum Note (Signed)
Addendum  created 01/08/21 1330 by Iran Ouch, MD   Intraprocedure Meds edited

## 2021-01-08 NOTE — Consult Note (Signed)
NAME:  Eric [name], MRN:  [id number], DOB:  1946-05-04, LOS: 0 ADMISSION DATE:  01/08/2021, CONSULTATION DATE:  01/08/2021 REFERRING MD:  Dr. Lacinda Axon, CHIEF COMPLAINT:  Agitation/Delirum   Brief Pt Description / Synopsis:  75 y.o. male with Hydrocephalus status post elective Right Frontal VP Shunt insertion.  Post-op with acute delirium/agitation felt to be due to complication of anesthesia plus Hyponatremia (Na+ 129) requiring Precedex drip.  History of Present Illness:  Eric [name] is a 75 y.o. Male with a past medical history as listed below, who presented for elective Right frontal VP shunt insertion on 01/08/21 due to Hydrocephalus.  Post-op in PACU, he was noted to be awake, but confused/combative/uncooperative.  He was given Fentanyl and Haldol without improvement in mental status.  He was then transferred to ICU.  Upon arrival to ICU he remains very agitated and combative.  Dr. Lacinda Axon with Neurosurgery has consulted PCCM for assistance with management of agitation/delirium.  He was placed on Precedex drip.  Workup shows Sodium 129 and glucose 206, all other labs unremarkable.  Urinalysis, serum osmolality, urine osmolality and urine sodium are currently pending.  Pertinent  Medical History  Hydrocephalus Dementia BPH Hyperlipidemia GERD Diabetes mellitus Anxiety Arthritis  Micro Data:  01/04/21: SARS-CoV-2 antigen>>negative 01/08/21: MRSA PCR>>negative  Antimicrobials:  Cefazolin 7/25 x1 dose (surgical prophylaxis)  Significant Hospital Events: Including procedures, antibiotic start and stop dates in addition to other pertinent events   01/08/21: underwent elective right frontal VQ shunt.  Post-op with delirium/agitation requiring Precedex gtt.  PCCM consulted. Found to be Hyponatremic (Na 129)  Interim History / Subjective:  -Patient arrived to ICU status post right VP shunt placement, with severe agitation felt to be secondary to anesthesia with history of dementia -PCCM  consulted for assistance with management of delirium -Will order for Precedex drip as needed -Check CBC, BMP, and UA to rule out any metabolic causes of delirium  Objective   Blood pressure (!) 159/83, pulse (!) 117, temperature (!) 97.3 F (36.3 C), resp. rate (!) 22, SpO2 99 %.        Intake/Output Summary (Last 24 hours) at 01/08/2021 1411 Last data filed at 01/08/2021 1202 Gross per 24 hour  Intake 900 ml  Output 30 ml  Net 870 ml   There were no vitals filed for this visit.  Examination: General: Acutely ill-appearing male, sitting in bed, agitated, no acute distress HENT: Status post right frontal VP shunt placement, dressing clean dry and intact, neck supple, no JVD Lungs: Clear to auscultation bilaterally, no rales or wheezing noted, even, nonlabored Cardiovascular: Regular rate and rhythm, S1-S2, no murmurs, rubs, gallops Abdomen: Rounded, soft, nontender, nondistended, no guarding rebound tenderness, bowel sounds positive x4 Extremities: Normal bulk and tone, no deformities, no edema Neuro: Awake, confused and agitated, purposeful movement to all 4 extremities, no focal deficits noted GU: Deferred  Resolved Hospital Problem list     Assessment & Plan:   Acute Delirium/Agitation, suspect secondary to Anesthesia + Hyponatremia PMHx of Dementia -Provide supportive care -Promote normal sleep/wake cycle -Precedex if needed -Avoid sedating meds as able -Correct Na+ (goal rate of correction as below)  Hyponatremia -Monitor I&O's / urinary output -Follow BMP -Ensure adequate renal perfusion -Avoid nephrotoxic agents as able -Replace electrolytes as indicated -IV fluids (NS @ 75 ml/hr) -Follow serum Na+ q4h -Goal rate of correction 4-6 mEq in 24 hr period (max rate of correction 8-10 mEq in 24 hr) -Check serum Osmolality, urine osmolality and urine Na+  Hydrocephalus s/p  Right Frontal VP Shunt -Neurosurgery is primary service, will follow recommendations -Follow  serial Neuro exams  Best Practice (right click and "Reselect all SmartList Selections" daily)   Diet/type: NPO DVT prophylaxis: SCD GI prophylaxis: PPI Lines: N/A Foley:  Yes, and it is still needed Code Status:  full code Last date of multidisciplinary goals of care discussion [N/A]  Updated pt's wife at bedside 01/08/21.  All questions answered.  Labs   CBC: No results for input(s): WBC, NEUTROABS, HGB, HCT, MCV, PLT in the last 168 hours.  Basic Metabolic Panel: No results for input(s): NA, K, CL, CO2, GLUCOSE, BUN, CREATININE, CALCIUM, MG, PHOS in the last 168 hours. GFR: Estimated Creatinine Clearance: 84.6 mL/min (by C-G formula based on SCr of 0.77 mg/dL). No results for input(s): PROCALCITON, WBC, LATICACIDVEN in the last 168 hours.  Liver Function Tests: No results for input(s): AST, ALT, ALKPHOS, BILITOT, PROT, ALBUMIN in the last 168 hours. No results for input(s): LIPASE, AMYLASE in the last 168 hours. No results for input(s): AMMONIA in the last 168 hours.  ABG No results found for: PHART, PCO2ART, PO2ART, HCO3, TCO2, ACIDBASEDEF, O2SAT   Coagulation Profile: No results for input(s): INR, PROTIME in the last 168 hours.  Cardiac Enzymes: No results for input(s): CKTOTAL, CKMB, CKMBINDEX, TROPONINI in the last 168 hours.  HbA1C: No results found for: HGBA1C  CBG: No results for input(s): GLUCAP in the last 168 hours.  Review of Systems:   Unable to assess due to AMS/agitation   Past Medical History:  He,  has a past medical history of Anxiety, Arthritis, 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 symptoms, Diabetes ([place]), Fall, GERD (gastroesophageal reflux disease), HLD (hyperlipidemia), Hydrocephalus ([place]), Hypertension, and Unsteady gait.   Surgical History:   Past Surgical History:  Procedure Laterality Date   arm surgery Left    fracture   COLONOSCOPY     TONSILLECTOMY  1962     Social History:   reports that he quit smoking about 15 years  ago. His smoking use included cigarettes. He has a 84.00 pack-year smoking history. He has never used smokeless tobacco. He reports that he does not drink alcohol and does not use drugs.   Family History:  His family history is negative for Prostate cancer, Kidney cancer, and Bladder Cancer.   Allergies No Known Allergies   Home Medications  Prior to Admission medications   Medication Sig Start Date End Date Taking? Authorizing Provider  aspirin EC 81 MG tablet Take 81 mg by mouth daily. Swallow whole.   Yes [provider]  diphenhydrAMINE HCl (ZZZQUIL) 50 MG/30ML LIQD Take 30 mLs by mouth daily as needed (Sleep).   Yes [provider]  finasteride (PROSCAR) 5 MG tablet Take 1 tablet (5 mg total) by mouth daily. Patient taking differently: Take 5 mg by mouth at bedtime. 11/01/16  Yes McGowan, Larene Beach A, PA-C  ibuprofen (ADVIL) 200 MG tablet Take 200 mg by mouth every 6 (six) hours as needed for mild pain or moderate pain.   Yes [provider]  lisinopril-hydrochlorothiazide (ZESTORETIC) 20-25 MG tablet Take 1 tablet by mouth every morning. 12/07/20  Yes [provider]  Melatonin 10 MG CAPS Take 10 mg by mouth at bedtime.   Yes [provider]  Multiple Vitamins-Minerals (CENTRUM SILVER ULTRA WOMENS) TABS Take 1 tablet by mouth daily. 07/06/07  Yes [provider]  omeprazole (PRILOSEC) 20 MG capsule Take 20 mg by mouth every other day. At bedtime 01/30/16  Yes [provider]  tamsulosin (FLOMAX) 0.4 MG CAPS capsule Take 1 capsule (0.4 mg total) by mouth daily. Patient taking differently: Take 0.4 mg by mouth daily after supper. 11/01/16  Yes McGowan, Larene Beach A, PA-C  bismuth subsalicylate (PEPTO BISMOL) 262 MG/15ML suspension Take 30 mLs by mouth every 6 (six) hours as needed.    [provider]     Critical care time: 50 minutes     Darel Hong, AGACNP-BC [facility] messenger  for cross cover needs If after hours, please call E-link

## 2021-01-08 NOTE — Transfer of Care (Signed)
Immediate Anesthesia Transfer of Care Note  Patient: Eric [name]  Procedure(s) Performed: RIGHT FRONTAL VENTRICULOPERITONEAL SHUNT INSERTION (Right)  Patient Location: PACU  Anesthesia Type:General  Level of Consciousness: awake and confused  Airway & Oxygen Therapy: Patient Spontanous Breathing and Patient connected to nasal cannula oxygen  Post-op Assessment: Report given to RN and Post -op Vital signs reviewed and stable  Post vital signs: Reviewed and stable  Last Vitals:  Vitals Value Taken Time  BP 105/55 01/08/21 1245  Temp 36.4 C 01/08/21 1230  Pulse 70 01/08/21 1246  Resp 16 01/08/21 1246  SpO2 99 % 01/08/21 1246  Vitals shown include unvalidated device data.  Last Pain:  Vitals:   01/08/21 1230  PainSc: 0-No pain         Complications: No notable events documented.

## 2021-01-08 NOTE — Anesthesia Preprocedure Evaluation (Signed)
Anesthesia Evaluation  Patient identified by MRN, date of birth, ID band Patient confused  General Assessment Comment:  Patient AOx3, but seems very spacey, unable to keep attention. Wife at bedside says this has been more pronounced since his hydrocephalus has been worsening  Reviewed: Allergy & Precautions, NPO status , Patient's Chart, lab work & pertinent test results  History of Anesthesia Complications Negative for: history of anesthetic complications  Airway Mallampati: III  TM Distance: >3 FB Neck ROM: Limited    Dental  (+) Edentulous Upper, Partial Lower   Pulmonary neg pulmonary ROS, neg sleep apnea, neg COPD, Patient abstained from smoking.Not current smoker, former smoker,    Pulmonary exam normal breath sounds clear to auscultation       Cardiovascular Exercise Tolerance: Good METShypertension, (-) CAD and (-) Past MI (-) dysrhythmias  Rhythm:Regular Rate:Normal - Systolic murmurs    Neuro/Psych PSYCHIATRIC DISORDERS Anxiety Hydrocephalus negative neurological ROS     GI/Hepatic GERD  ,(+)     (-) substance abuse  ,   Endo/Other  neg diabetes  Renal/GU negative Renal ROS     Musculoskeletal   Abdominal   Peds  Hematology   Anesthesia Other Findings Past Medical History: No date: Anxiety No date: Arthritis No date: 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 symptoms No date: Diabetes ([facility])     Comment:  no meds-last a1c 5.7 No date: Fall No date: GERD (gastroesophageal reflux disease) No date: HLD (hyperlipidemia) No date: Hydrocephalus ([facility]) No date: Hypertension No date: Unsteady gait  Reproductive/Obstetrics                             Anesthesia Physical Anesthesia Plan  ASA: 3  Anesthesia Plan: General   Post-op Pain Management:    Induction: Intravenous  PONV Risk Score and Plan: 2 and Ondansetron and Dexamethasone  Airway Management Planned: Oral  ETT and Video Laryngoscope Planned  Additional Equipment: None  Intra-op Plan:   Post-operative Plan: Extubation in OR  Informed Consent: I have reviewed the patients History and Physical, chart, labs and discussed the procedure including the risks, benefits and alternatives for the proposed anesthesia with the patient or authorized representative who has indicated his/her understanding and acceptance.     Dental advisory given  Plan Discussed with: CRNA and Surgeon  Anesthesia Plan Comments: (Discussed risks of anesthesia with patient and wife at bedside, including PONV, sore throat, lip/dental damage. Rare risks discussed as well, such as cardiorespiratory and neurological sequelae. Patient and wife understand.)        Anesthesia Quick Evaluation

## 2021-01-08 NOTE — Interval H&P Note (Signed)
History and Physical Interval Note:  01/08/2021 9:50 AM  Eric [name]  has presented today for surgery, with the diagnosis of hydrocephalus.  The various methods of treatment have been discussed with the patient and family. After consideration of risks, benefits and other options for treatment, the patient has consented to  Procedure(s): RIGHT FRONTAL VENTRICULOPERITONEAL SHUNT INSERTION (Right) as a surgical intervention.  The patient's history has been reviewed, patient examined, no change in status, stable for surgery.  I have reviewed the patient's chart and labs.  Questions were answered to the patient's satisfaction.     Deetta Perla

## 2021-01-09 ENCOUNTER — Encounter: Payer: Self-pay | Admitting: Neurosurgery

## 2021-01-09 ENCOUNTER — Inpatient Hospital Stay: Payer: Medicare Other

## 2021-01-09 DIAGNOSIS — G911 Obstructive hydrocephalus: Secondary | ICD-10-CM | POA: Diagnosis not present

## 2021-01-09 DIAGNOSIS — N4889 Other specified disorders of penis: Secondary | ICD-10-CM

## 2021-01-09 DIAGNOSIS — N472 Paraphimosis: Secondary | ICD-10-CM | POA: Diagnosis not present

## 2021-01-09 DIAGNOSIS — F05 Delirium due to known physiological condition: Secondary | ICD-10-CM | POA: Diagnosis not present

## 2021-01-09 DIAGNOSIS — R410 Disorientation, unspecified: Secondary | ICD-10-CM | POA: Diagnosis not present

## 2021-01-09 LAB — GLUCOSE, CAPILLARY
Glucose-Capillary: 124 mg/dL — ABNORMAL HIGH (ref 70–99)
Glucose-Capillary: 142 mg/dL — ABNORMAL HIGH (ref 70–99)
Glucose-Capillary: 149 mg/dL — ABNORMAL HIGH (ref 70–99)
Glucose-Capillary: 172 mg/dL — ABNORMAL HIGH (ref 70–99)

## 2021-01-09 LAB — CBC
HCT: 39.2 % (ref 39.0–52.0)
Hemoglobin: 14.3 g/dL (ref 13.0–17.0)
MCH: 33.2 pg (ref 26.0–34.0)
MCHC: 36.5 g/dL — ABNORMAL HIGH (ref 30.0–36.0)
MCV: 91 fL (ref 80.0–100.0)
Platelets: 283 10*3/uL (ref 150–400)
RBC: 4.31 MIL/uL (ref 4.22–5.81)
RDW: 11.3 % — ABNORMAL LOW (ref 11.5–15.5)
WBC: 10.2 10*3/uL (ref 4.0–10.5)
nRBC: 0 % (ref 0.0–0.2)

## 2021-01-09 LAB — BASIC METABOLIC PANEL
Anion gap: 5 (ref 5–15)
BUN: 15 mg/dL (ref 8–23)
CO2: 26 mmol/L (ref 22–32)
Calcium: 8.5 mg/dL — ABNORMAL LOW (ref 8.9–10.3)
Chloride: 105 mmol/L (ref 98–111)
Creatinine, Ser: 0.62 mg/dL (ref 0.61–1.24)
GFR, Estimated: >60 mL/min (ref >60)
Glucose, Bld: 192 mg/dL — ABNORMAL HIGH (ref 70–99)
Potassium: 3.9 mmol/L (ref 3.5–5.1)
Sodium: 136 mmol/L (ref 135–145)

## 2021-01-09 LAB — MAGNESIUM: Magnesium: 2.2 mg/dL (ref 1.7–2.4)

## 2021-01-09 LAB — SODIUM
Sodium: 136 mmol/L (ref 135–145)
Sodium: 136 mmol/L (ref 135–145)

## 2021-01-09 LAB — TSH: TSH: 2.709 u[IU]/mL (ref 0.350–4.500)

## 2021-01-09 LAB — PHOSPHORUS: Phosphorus: 3 mg/dL (ref 2.5–4.6)

## 2021-01-09 MED ORDER — LORAZEPAM 2 MG/ML IJ SOLN
1.0000 mg | Freq: Once | INTRAMUSCULAR | Status: DC | PRN
  Filled 2021-01-09: qty 1

## 2021-01-09 MED ORDER — VALPROATE SODIUM 100 MG/ML IV SOLN: 500.0000 mg | Freq: Two times a day (BID) | INTRAVENOUS | Status: DC

## 2021-01-09 MED ORDER — VALPROATE SODIUM 100 MG/ML IV SOLN
125.0000 mg | Freq: Four times a day (QID) | INTRAVENOUS | Status: DC
  Administered 2021-01-09 (×2): 125 mg via INTRAVENOUS
  Filled 2021-01-09 (×4): qty 1.25

## 2021-01-09 MED ORDER — DIPHENHYDRAMINE HCL 50 MG/ML IJ SOLN
12.5000 mg | Freq: Every evening | INTRAMUSCULAR | Status: DC | PRN
Start: 2021-01-09 — End: 2021-01-10
  Administered 2021-01-09: 12.5 mg via INTRAVENOUS
  Filled 2021-01-09: qty 1

## 2021-01-09 MED ORDER — INSULIN ASPART 100 UNIT/ML IJ SOLN
0.0000 [IU] | INTRAMUSCULAR | Status: DC
  Administered 2021-01-09: 1 [IU] via SUBCUTANEOUS
  Administered 2021-01-09 – 2021-01-10 (×2): 2 [IU] via SUBCUTANEOUS
  Administered 2021-01-10 – 2021-01-12 (×6): 1 [IU] via SUBCUTANEOUS
  Administered 2021-01-13 (×2): 2 [IU] via SUBCUTANEOUS
  Administered 2021-01-13: 1 [IU] via SUBCUTANEOUS
  Administered 2021-01-13: 3 [IU] via SUBCUTANEOUS
  Administered 2021-01-13: 1 [IU] via SUBCUTANEOUS
  Administered 2021-01-14 (×3): 2 [IU] via SUBCUTANEOUS
  Administered 2021-01-14 (×3): 1 [IU] via SUBCUTANEOUS
  Administered 2021-01-15: 3 [IU] via SUBCUTANEOUS
  Administered 2021-01-15: 1 [IU] via SUBCUTANEOUS
  Administered 2021-01-15: 3 [IU] via SUBCUTANEOUS
  Administered 2021-01-15: 1 [IU] via SUBCUTANEOUS
  Administered 2021-01-15: 2 [IU] via SUBCUTANEOUS
  Administered 2021-01-15: 3 [IU] via SUBCUTANEOUS
  Administered 2021-01-15: 2 [IU] via SUBCUTANEOUS
  Administered 2021-01-16: 3 [IU] via SUBCUTANEOUS
  Administered 2021-01-16: 5 [IU] via SUBCUTANEOUS
  Administered 2021-01-16 (×3): 3 [IU] via SUBCUTANEOUS
  Administered 2021-01-16: 5 [IU] via SUBCUTANEOUS
  Administered 2021-01-17: 3 [IU] via SUBCUTANEOUS
  Administered 2021-01-17: 2 [IU] via SUBCUTANEOUS
  Administered 2021-01-17: 5 [IU] via SUBCUTANEOUS
  Administered 2021-01-17: 2 [IU] via SUBCUTANEOUS
  Administered 2021-01-18 (×2): 5 [IU] via SUBCUTANEOUS
  Administered 2021-01-18 (×2): 2 [IU] via SUBCUTANEOUS
  Administered 2021-01-18: 3 [IU] via SUBCUTANEOUS
  Administered 2021-01-19: 5 [IU] via SUBCUTANEOUS
  Administered 2021-01-19 (×3): 3 [IU] via SUBCUTANEOUS
  Filled 2021-01-09 (×45): qty 1

## 2021-01-09 MED ORDER — LORAZEPAM 2 MG/ML IJ SOLN: 0.5000 mg | Freq: Once | INTRAMUSCULAR | Status: DC

## 2021-01-09 MED ORDER — VALPROATE SODIUM 100 MG/ML IV SOLN
125.0000 mg | Freq: Once | INTRAVENOUS | Status: DC | PRN
  Filled 2021-01-09: qty 1.25

## 2021-01-09 MED ORDER — DIPHENHYDRAMINE HCL 50 MG/ML IJ SOLN: 12.5000 mg | Freq: Every evening | INTRAMUSCULAR | Status: DC | PRN

## 2021-01-09 MED ORDER — 0.9 % SODIUM CHLORIDE (POUR BTL) OPTIME
TOPICAL | Status: DC | PRN
  Administered 2021-01-08: 1000 mL

## 2021-01-09 MED ORDER — VALPROATE SODIUM 100 MG/ML IV SOLN
125.0000 mg | Freq: Four times a day (QID) | INTRAVENOUS | Status: DC
  Filled 2021-01-09 (×4): qty 1.25

## 2021-01-09 NOTE — Progress Notes (Signed)
PT Cancellation Note  Patient Details Name: Eric [name] MRN: [id number] DOB: 10/05/1945   Cancelled Treatment:    Reason Eval/Treat Not Completed: Other (comment) Discussed case with OT, per RN: pt not consistently able to cooperate. Currently in restraints 2/2 being combative. Unsafe to engage with rehab services this date. Will continue to follow and initiate PT services as available/pt medically appropriate.     Kreg Shropshire, DPT 01/09/2021, 1:21 PM

## 2021-01-09 NOTE — Progress Notes (Signed)
Late Entry 01/09/2021 1310.  Pt arrived in PACU sedated, started to arouse from anesthesia, swinging arms, kicking legs, attempting to get out of the bed. CRNA, PACU nurse, several nurses at bedside immediately to attempt to calm patient and prevent any harm to patient.  Dr. Lacinda Axon at bedside attempting to talk with patient and encourage him to lay still to prevent any damage to recent surgical areas.  Pt continues to swing arms, kick legs, attempting to get out of the bed despite verbal commands to remain calm and still. Dr. Wynetta Emery summoned to bedside to assess the situation, medications ordered and given at bedside. Soft restraints, mitts placed on patient's hands, due to patient pulling IV out. Pt continues to swing arms, kicking legs, attempting to get out of the bed, patient stating "I am going to beat the s.Marland Kitchent out of you".  Cooper Render PA at bedside, ordered 4 point restraints, restraints applied in slip knot to all 4 extremities by Loretta Plume CNA. 4 Nurses (C. Zamani Crocker RN, A. Tippett RN, A. Maximino Sarin, K. Register RN)at bedside entire duration of patient in the PACU.  Spouse brought to bedside to attempt to calm patient.  Patient did calm with spouse present, but continued to attempt to swing arms, legs, and get out to the bed. Patient transferred to ICU.

## 2021-01-09 NOTE — Progress Notes (Signed)
Code blue was canceled for patient, however Chaplain followed up. Offered prayer for patient at doorway as patient was being attended to by nursing staff.

## 2021-01-09 NOTE — Op Note (Signed)
Operative Note   SURGERY DATE: 01/08/2021   PRE-OP DIAGNOSIS: Hydrocephalus   POST-OP DIAGNOSIS:  Hydrocephalus   Procedure(s) with comments: Right Frontal Ventriculoperitoneal Shunt   SURGEON:    * Malen Gauze, MD       Cooper Render, PA Assistant   ANESTHESIA: General    OPERATIVE FINDINGS: Hydrocephalus  Indications: Eric [name] was seen in clinic on 5/17 and found to have hydrocephalus. He was having symptoms of gait difficulty and confusion. Given this, we recommended a VP shunt for treatment.  The risks including hematoma, infection, damage to brain, damage to abdomen, malfunction, stroke, seizure, and need for revision surgery were discussed. The patient elected to proceed with the surgery.   Procedure:  The patient was brought to the operating room and placed in supine position with head turned left and shoulder bump on a standard OR table. The anesthesia service had vascular access and intubated the patient.  The right frontal area down to abdomen was prepped and draped in a sterile fashion.  A hard timeout was performed.  Local anesthetic was instilled into the planned incisions.   The right frontal incision was opened sharply and the bone exposed. Using anatomic landmarks, the right frontal burr hole was performed. Next, the dura was coagulated and then opened sharply. The brain was seen and a small corticectomy made/ Next, the ventricular catheter was placed in perpendicular manner to a depth of 5 cm where clear CSF was obtained. The tubing was then connected to a Codman programmable valve set at 120 mm H20 and CSF sent for analysis.   Next, a track was made to the retromastoid area where a small incision was made and the distal catheter tunneled to this area after connection to the valve. Then the RUQ of the abdomen was opened sharply and dissection taking through muscle layers to the peritoneal wall. This was opened and intraperitoneal contents seen. Next, the tunneler was  used to bring the distal catheter from the scalp to the abdomen. Once there, spontaneous CSF flow as confirmed. The distal catheter was then placed into the abdomen gently. Once all catheter was in place, we moved to closure.   All incisions were irrigated with saline. Next, in the scalp incisions, 2-0 vicryl was used to close galea and then 3-0 Nylon on the skin. In the abdomen, the peritoneal layer and subcutaneous layers were closed with 2-0 vicryl.   The skin was closed with surgical adhesive.  Dressings were applied.  The patient was awoken from anesthesia.  Patient was taken to the PACU for further recovery and seen to be at neurologic baseline.  I discussed the results with the family            ESTIMATED BLOOD LOSS:  30 cc     IMPLANT VALVE PROGRAM HAKIM  SYS - [id number]  Inventory Item: VALVE PROGRAM HAKIM  SYS Serial no.:  Model/Cat no.: [id number]  Implant name: VALVE PROGRAM HAKIM  SYS - [id number] Laterality: Right Area:   Manufacturer: [employer] Date of Manufacture:    Action: Wasted Number Used: 1   Device Identifier:  Device Identifier Type:     VALVE PROGRAM HAKIM  SYS - [id number]  Inventory Item: VALVE PROGRAM HAKIM  SYS Serial no.:  Model/Cat no.: [id number]  Implant name: VALVE PROGRAM Lennox Solders - [id number] Laterality: Right Area: Cranial  Manufacturer: [employer] Date of Manufacture:    Action: Implanted Number Used: 1   Device Identifier:  Device Identifier Type:  I performed the case in its entirety with assistance of PA, Ernestene Kiel, [place]

## 2021-01-09 NOTE — Progress Notes (Signed)
Due to patient thrashing in bed, PIV came out, another inserted.

## 2021-01-09 NOTE — Progress Notes (Signed)
NAME:  Eric [name], MRN:  [id number], DOB:  [birth year], LOS: 1 ADMISSION DATE:  01/08/2021, CONSULTATION DATE:  01/08/2021 REFERRING MD:  Dr. Lacinda Axon, CHIEF COMPLAINT:  Agitation/Delirum   Brief Pt Description / Synopsis:  75 y.o. male with Hydrocephalus status post elective Right Frontal VP Shunt insertion.  Post-op with acute delirium/agitation felt to be due to complication of anesthesia plus Hyponatremia (Na+ 129) requiring Precedex drip.  History of Present Illness:  Eric [name] is a 75 y.o. Male with a past medical history as listed below, who presented for elective Right frontal VP shunt insertion on 01/08/21 due to Hydrocephalus.  Post-op in PACU, he was noted to be awake, but confused/combative/uncooperative.  He was given Fentanyl and Haldol without improvement in mental status.  He was then transferred to ICU.  Upon arrival to ICU he remains very agitated and combative.  Dr. Lacinda Axon with Neurosurgery has consulted PCCM for assistance with management of agitation/delirium.  He was placed on Precedex drip.  Workup shows Sodium 129 and glucose 206, all other labs unremarkable.  Urinalysis, serum osmolality, urine osmolality and urine sodium are currently pending.  Pertinent  Medical History  Hydrocephalus Dementia BPH Hyperlipidemia GERD Diabetes mellitus Anxiety Arthritis  Micro Data:  01/04/21: SARS-CoV-2 antigen>>negative 01/08/21: MRSA PCR>>negative  Antimicrobials:  Cefazolin 7/25 x1 dose (surgical prophylaxis)  Significant Hospital Events: Including procedures, antibiotic start and stop dates in addition to other pertinent events   01/08/21: underwent elective right frontal VQ shunt.  Post-op with delirium/agitation requiring Precedex gtt.  PCCM consulted. Found to be Hyponatremic (Na 129) 01/09/21: pt with continued delirium/combativeness still requiring Precedex gtt.  Will consult neurology   Interim History / Subjective:  -Patient arrived to ICU status post right VP  shunt placement, with severe agitation felt to be secondary to anesthesia with history of dementia -PCCM consulted for assistance with management of delirium -Pt with continued agitation/combativeness continues to require Precedex gtt   Objective   Blood pressure 103/68, pulse (!) 51, temperature 97.8 F (36.6 C), temperature source Oral, resp. rate 16, SpO2 97 %.        Intake/Output Summary (Last 24 hours) at 01/09/2021 0945 Last data filed at 01/09/2021 0800 Gross per 24 hour  Intake 1548.71 ml  Output 4130 ml  Net -2581.29 ml   There were no vitals filed for this visit.  Examination: General: Acutely ill appearing elderly male, NAD, with intermittent agitation/combativeness  HENT: Status post right frontal VP shunt placement, dressing clean dry and intact at incision site, neck supple, no JVD Lungs: Clear to auscultation bilaterally, no rales or wheezing noted, even, nonlabored Cardiovascular: Regular rate and rhythm, S1-S2, no murmurs, rubs, gallops Abdomen: Rounded, soft, nontender, nondistended, no guarding rebound tenderness, bowel sounds positive x4 Extremities: Normal bulk and tone, no deformities, no edema Neuro: Awake, confused to situation/time and intermittent agitated, purposeful movement to all 4 extremities, no focal deficits noted GU: indwelling foley in place with penile cyanosis   Resolved Hospital Problem list     Assessment & Plan:   Acute Delirium/Agitation, suspect secondary to Anesthesia +   Status post elective Right Frontal VP Shunt insertion 07/25 Hyponatremia PMHx of Dementia and Falls  -Provide supportive care -Promote normal sleep/wake cycle: continue outpatient melatonin -Will attempt to wean precedex gtt as tolerated  -Will consult neurology given worsening dementia and now postop agitation/delirium hx and appreciate input  -Will check thyroid panel with TSH  -Avoid sedating meds as able  HTN Hx: HLD -Continuous telemetry  monitoring -  Continue outpatient antihypertensives and finasteride   Hyponatremia-improving  -Monitor I&O's / urinary output -Follow BMP -Ensure adequate renal perfusion -Avoid nephrotoxic agents as able -Replace electrolytes as indicated -Serum osmolality 283/urine osmolality 586/urine sodium 152 : IV fluids discontinued  -Goal rate of correction 4-6 mEq in 24 hr period (max rate of correction 8-10 mEq in 24 hr)  Hydrocephalus s/p Right Frontal VP Shunt -Neurosurgery is primary service, will follow recommendations -Follow serial Neuro exams  Penile cyanosis/pain Hx: BPH -Will consult urology appreciate input  -Monitor UOP  -Continue outpatient flomax   Type II diabetes mellitus  -CBG's q4hrs  -SSI   Best Practice (right click and "Reselect all SmartList Selections" daily)   Diet/type: Clear Liquid Diet Advance As Tolerated  DVT prophylaxis: SCD GI prophylaxis: PPI Lines: N/A Foley:  Yes, and it is still needed Code Status:  full code Last date of multidisciplinary goals of care discussion [07/26]   Labs   CBC: Recent Labs  Lab 01/08/21 1442 01/09/21 0320  WBC 8.4 10.2  HGB 13.1 14.3  HCT 35.8* 39.2  MCV 91.6 91.0  PLT 227 [id number]    Basic Metabolic Panel: Recent Labs  Lab 01/08/21 1442 01/08/21 1851 01/08/21 2304 01/09/21 0320 01/09/21 0730  NA 129* 131* 134* 136 136  K 3.5  --   --  3.9  --   CL 98  --   --  105  --   CO2 25  --   --  26  --   GLUCOSE 206*  --   --  192*  --   BUN 14  --   --  15  --   CREATININE 0.84  --   --  0.62  --   CALCIUM 7.9*  --   --  8.5*  --   MG  --   --   --  2.2  --   PHOS  --   --   --  3.0  --    GFR: Estimated Creatinine Clearance: 84.6 mL/min (by C-G formula based on SCr of 0.62 mg/dL). Recent Labs  Lab 01/08/21 1442 01/09/21 0320  WBC 8.4 10.2    Liver Function Tests: No results for input(s): AST, ALT, ALKPHOS, BILITOT, PROT, ALBUMIN in the last 168 hours. No results for input(s): LIPASE, AMYLASE in the  last 168 hours. No results for input(s): AMMONIA in the last 168 hours.  ABG No results found for: PHART, PCO2ART, PO2ART, HCO3, TCO2, ACIDBASEDEF, O2SAT   Coagulation Profile: No results for input(s): INR, PROTIME in the last 168 hours.  Cardiac Enzymes: No results for input(s): CKTOTAL, CKMB, CKMBINDEX, TROPONINI in the last 168 hours.  HbA1C: No results found for: HGBA1C  CBG: No results for input(s): GLUCAP in the last 168 hours.  Review of Systems: Positives in BOLD   Gen: Denies fever, chills, weight change, fatigue, night sweats HEENT: Denies blurred vision, double vision, hearing loss, tinnitus, sinus congestion, rhinorrhea, sore throat, neck stiffness, dysphagia PULM: Denies shortness of breath, cough, sputum production, hemoptysis, wheezing CV: Denies chest pain, edema, orthopnea, paroxysmal nocturnal dyspnea, palpitations GI: Denies abdominal pain, nausea, vomiting, diarrhea, hematochezia, melena, constipation, change in bowel habits GU: penile pain, dysuria, hematuria, polyuria, oliguria, urethral discharge Endocrine: Denies hot or cold intolerance, polyuria, polyphagia or appetite change Derm: Denies rash, dry skin, scaling or peeling skin change Heme: Denies easy bruising, bleeding, bleeding gums Neuro: Denies headache, numbness, weakness, slurred speech, loss of memory or consciousness     Past Medical History:  He,  has a past medical history of Anxiety, Arthritis, 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 symptoms, Diabetes ([place]), Fall, GERD (gastroesophageal reflux disease), HLD (hyperlipidemia), Hydrocephalus ([place]), Hypertension, and Unsteady gait.   Surgical History:   Past Surgical History:  Procedure Laterality Date   arm surgery Left    fracture   COLONOSCOPY     TONSILLECTOMY  1962     Social History:   reports that he quit smoking about 15 years ago. His smoking use included cigarettes. He has a 84.00 pack-year smoking history. He has never  used smokeless tobacco. He reports that he does not drink alcohol and does not use drugs.   Family History:  His family history is negative for Prostate cancer, Kidney cancer, and Bladder Cancer.   Allergies No Known Allergies   Home Medications  Prior to Admission medications   Medication Sig Start Date End Date Taking? Authorizing Provider  aspirin EC 81 MG tablet Take 81 mg by mouth daily. Swallow whole.   Yes [provider]  diphenhydrAMINE HCl (ZZZQUIL) 50 MG/30ML LIQD Take 30 mLs by mouth daily as needed (Sleep).   Yes [provider]  finasteride (PROSCAR) 5 MG tablet Take 1 tablet (5 mg total) by mouth daily. Patient taking differently: Take 5 mg by mouth at bedtime. 11/01/16  Yes McGowan, Larene Beach A, PA-C  ibuprofen (ADVIL) 200 MG tablet Take 200 mg by mouth every 6 (six) hours as needed for mild pain or moderate pain.   Yes [provider]  lisinopril-hydrochlorothiazide (ZESTORETIC) 20-25 MG tablet Take 1 tablet by mouth every morning. 12/07/20  Yes [provider]  Melatonin 10 MG CAPS Take 10 mg by mouth at bedtime.   Yes [provider]  Multiple Vitamins-Minerals (CENTRUM SILVER ULTRA WOMENS) TABS Take 1 tablet by mouth daily. 07/06/07  Yes [provider]  omeprazole (PRILOSEC) 20 MG capsule Take 20 mg by mouth every other day. At bedtime 01/30/16  Yes [provider]  tamsulosin (FLOMAX) 0.4 MG CAPS capsule Take 1 capsule (0.4 mg total) by mouth daily. Patient taking differently: Take 0.4 mg by mouth daily after supper. 11/01/16  Yes McGowan, Larene Beach A, PA-C  bismuth subsalicylate (PEPTO BISMOL) 262 MG/15ML suspension Take 30 mLs by mouth every 6 (six) hours as needed.    [provider]     Critical care time: 35 minutes     Rosilyn Mings, [place] Pager [phone] (please enter 7 digits) PCCM Consult Pager [phone] (please enter 7 digits)

## 2021-01-09 NOTE — Consult Note (Signed)
Urology Consult  I have been asked to see the patient by Rosilyn Mings, NP, for evaluation and management of penile cyanosis.  Chief Complaint: Penile cyanosis and pain  History of Present Illness: Eric [name] is a 75 y.o. year old male with hydrocephalus admitted s/p elective right frontal VP shunt insertion due to postoperative acute delirium and agitation as well as hyponatremia requiring Precedex.  Urology has been consulted for evaluation of penile cyanosis and pain.  Patient is delirious today, wearing mitts, and unable to provide a thorough history.  He denies pain in the penis, though per chart review it appears he has complained about this to his other providers.  Foley catheter in place draining clear, yellow urine.  Past Medical History:  Diagnosis Date   Anxiety    Arthritis    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 symptoms    Diabetes ([place])    no meds-last a1c 5.7   Fall    GERD (gastroesophageal reflux disease)    HLD (hyperlipidemia)    Hydrocephalus ([facility])    Hypertension    Unsteady gait     Past Surgical History:  Procedure Laterality Date   arm surgery Left    fracture   COLONOSCOPY     TONSILLECTOMY  1962   VENTRICULOPERITONEAL SHUNT Right 01/08/2021   Procedure: RIGHT FRONTAL VENTRICULOPERITONEAL SHUNT INSERTION;  Surgeon: Deetta Perla, MD;  Location: [facility] ORS;  Service: Neurosurgery;  Laterality: Right;    Home Medications:  Current Meds  Medication Sig   aspirin EC 81 MG tablet Take 81 mg by mouth daily. Swallow whole.   diphenhydrAMINE HCl (ZZZQUIL) 50 MG/30ML LIQD Take 30 mLs by mouth daily as needed (Sleep).   finasteride (PROSCAR) 5 MG tablet Take 1 tablet (5 mg total) by mouth daily. (Patient taking differently: Take 5 mg by mouth at bedtime.)   ibuprofen (ADVIL) 200 MG tablet Take 200 mg by mouth every 6 (six) hours as needed for mild pain or moderate pain.   lisinopril-hydrochlorothiazide (ZESTORETIC) 20-25 MG tablet Take 1 tablet by  mouth every morning.   Melatonin 10 MG CAPS Take 10 mg by mouth at bedtime.   Multiple Vitamins-Minerals (CENTRUM SILVER ULTRA WOMENS) TABS Take 1 tablet by mouth daily.   omeprazole (PRILOSEC) 20 MG capsule Take 20 mg by mouth every other day. At bedtime   tamsulosin (FLOMAX) 0.4 MG CAPS capsule Take 1 capsule (0.4 mg total) by mouth daily. (Patient taking differently: Take 0.4 mg by mouth daily after supper.)    Allergies: No Known Allergies  Family History  Problem Relation Age of Onset   Prostate cancer Neg Hx    Kidney cancer Neg Hx    Bladder Cancer Neg Hx     Social History:  reports that he quit smoking about 15 years ago. His smoking use included cigarettes. He has a 84.00 pack-year smoking history. He has never used smokeless tobacco. He reports that he does not drink alcohol and does not use drugs.  ROS: A complete review of systems was performed.  All systems are negative except for pertinent findings as noted.  Physical Exam:  Vital signs in last 24 hours: Temp:  [97.3 F (36.3 C)-97.8 F (36.6 C)] 97.3 F (36.3 C) (07/26 1200) Pulse Rate:  [49-100] 51 (07/26 1200) Resp:  [12-28] 17 (07/26 1200) BP: (103-142)/(60-78) 114/64 (07/26 1200) SpO2:  [94 %-100 %] 98 % (07/26 1200) Constitutional:  Alert, no acute distress HEENT: [place] AT, moist mucus membranes Cardiovascular: No clubbing or  edema Respiratory: Normal respiratory effort GU: Cyanotic glans penis with retracted foreskin and Foley catheter in place.  No penile or foreskin edema noted. Skin: No rashes, bruises or suspicious lesions Neurologic: Grossly intact, no focal deficits, moving all 4 extremities Psychiatric: Delirious  Laboratory Data:  Recent Labs    01/08/21 1442 01/09/21 0320  WBC 8.4 10.2  HGB 13.1 14.3  HCT 35.8* 39.2   Recent Labs    01/08/21 1442 01/08/21 1851 01/08/21 2304 01/09/21 0320 01/09/21 0730  NA 129*   < > 134* 136 136  K 3.5  --   --  3.9  --   CL 98  --   --  105  --    CO2 25  --   --  26  --   GLUCOSE 206*  --   --  192*  --   BUN 14  --   --  15  --   CREATININE 0.84  --   --  0.62  --   CALCIUM 7.9*  --   --  8.5*  --    < > = values in this interval not displayed.   Urinalysis    Component Value Date/Time   COLORURINE YELLOW (A) 01/08/2021 1656   APPEARANCEUR CLEAR (A) 01/08/2021 1656   LABSPEC 1.018 01/08/2021 1656   PHURINE 6.0 01/08/2021 1656   GLUCOSEU 50 (A) 01/08/2021 1656   HGBUR LARGE (A) 01/08/2021 1656   BILIRUBINUR NEGATIVE 01/08/2021 1656   KETONESUR NEGATIVE 01/08/2021 1656   PROTEINUR NEGATIVE 01/08/2021 1656   NITRITE NEGATIVE 01/08/2021 1656   LEUKOCYTESUR NEGATIVE 01/08/2021 1656   Results for orders placed or performed during the hospital encounter of 01/08/21  MRSA Next Gen by PCR, Nasal     Status: None   Collection Time: 01/08/21  2:10 PM   Specimen: Nasal Mucosa; Nasal Swab  Result Value Ref Range Status   MRSA by PCR Next Gen NOT DETECTED NOT DETECTED Final    Comment: (NOTE) The GeneXpert MRSA Assay (FDA approved for NASAL specimens only), is one component of a comprehensive MRSA colonization surveillance program. It is not intended to diagnose MRSA infection nor to guide or monitor treatment for MRSA infections. Test performance is not FDA approved in patients less than 37 years old. Performed at [facility], [address], [place], [place] [address]    Assessment & Plan:  75 year old male admitted with postoperative delirium/agitation requiring Precedex after undergoing an elective right frontal VP shunt insertion.  On physical exam today, cyanosis of the glans penis is felt secondary to paraphimosis.  Using nursing assistance to restrain the patient, I reduced the foreskin and the glans penis was noted to pink up appropriately with no residual cyanosis.  Capillary refill is now normal.  Upon completion of the procedure, patient reported resolution of penile pain.  No further intervention  indicated.  Urology to sign off at this time.  Debroah Loop, PA-C 01/09/2021 1:52 PM

## 2021-01-09 NOTE — Progress Notes (Signed)
Pts wife Tiquan Bendel at bedside updated her regarding current plan of care and all questions answered. Mrs. Kinsler was appreciative of update.  Will continue to monitor and assess pt.  Rosilyn Mings, AGNP  Pulmonary/Critical Care Pager [phone] (please enter 7 digits) PCCM Consult Pager [phone] (please enter 7 digits)

## 2021-01-09 NOTE — Progress Notes (Signed)
Patient's restraints removed to evaluate further need.

## 2021-01-09 NOTE — Progress Notes (Signed)
Patient's wife being disruptive in care, attempting to provide the patient water despite repeated explanations it would be extremely unsafe and put the patient at risk for aspiration.   Her interaction with the patient seems to aggravate him further. It was encouraged to decrease any stimulation though she continues attempts to tend to him.  She was notified if she continues to act against the patient's best interest and against our health care recommendations, she will be asked to leave as she is putting the patient's safety at risk.

## 2021-01-09 NOTE — Consult Note (Addendum)
Neurology Consultation Reason for Consult: Agitated delirium in the setting of recent shunt placement for c/f obstructive hydrocephalus  Requesting Physician: Vernard Gambles   CC: agitation   History is obtained from: Wife and chart review  HPI: Eric [name] is a 75 y.o. 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  This was evaluated by Dr. Lacinda Axon of neurosurgery on an outpatient basis and decision was made to proceed with shunt which was completed on 7/25.  Unfortunately the patient has been severely agitated since, unable to safely wean Precedex drip and therefore neurology has been consulted for further recommendations  His wife reports he has a longstanding history of insomnia, and indeed this is noted on a PCP note from 01/30/2016 at which time he was on triazolam and there was a plan to trial Ambien.  Wife notes that she was concerned about his ongoing triazolam use and recently got him to discontinue that medication about 5 or 6 months ago.  She has been managing his insomnia with melatonin as well's Unisom initially and now ZzzQuil, which she was surprised to learn her medications in the same class (antihistamines).  She reports that he takes this medication nightly.  Additionally he was found to have a cyanotic penis this morning which was evaluated and addressed by urology with significant improvement in his agitation though this was not long-lasting.  Notably in recent ED presentations he has gotten agitated and (10/24/2019 and 10/24/2020)  ROS: Unable to obtain due to altered  mental status.   Past Medical History:  Diagnosis Date   Anxiety    Arthritis    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 symptoms    Diabetes (HCC)    no meds-last a1c 5.7   Fall    GERD (gastroesophageal reflux disease)    HLD (hyperlipidemia)    Hydrocephalus (HCC)    Hypertension    Unsteady gait    Current Outpatient Medications  Medication Instructions   aspirin EC 81 mg, Oral, Daily, Swallow whole.   bismuth subsalicylate (PEPTO BISMOL) 262 MG/15ML suspension 30 mLs, Oral, Every 6 hours PRN   diphenhydrAMINE HCl (ZZZQUIL) 50 MG/30ML LIQD 30 mLs, Oral, Daily PRN   finasteride (PROSCAR) 5 mg, Oral, Daily   ibuprofen (ADVIL) 200 mg, Oral, Every 6 hours PRN   lisinopril-hydrochlorothiazide (ZESTORETIC) 20-25 MG tablet 1 tablet, Oral, BH-each morning   Melatonin 10 mg, Oral, Nightly   Multiple Vitamins-Minerals (CENTRUM SILVER ULTRA WOMENS) TABS 1 tablet, Oral, Daily   omeprazole (PRILOSEC) 20 mg, Oral, Every other day, At bedtime   tamsulosin (FLOMAX) 0.4 mg, Oral, Daily     Family History  Problem Relation Age of Onset   Prostate cancer Neg Hx    Kidney cancer Neg Hx    Bladder Cancer Neg Hx     Social History:  reports that he quit smoking about 15 years ago. His smoking use included cigarettes. He has a 84.00 pack-year smoking history. He has never used smokeless tobacco. He reports that he does not drink alcohol and does not use drugs.   Exam: Current vital signs: BP 122/62   Pulse (!) 55   Temp (!) 97.3 F (36.3 C) (Axillary)  Resp 16   SpO2 100%  Vital signs in last 24 hours: Temp:  [97.3 F (36.3 C)-97.8 F (36.6 C)] 97.3 F (36.3 C) (07/26 1200) Pulse Rate:  [49-87] 55 (07/26 1400) Resp:  [12-28] 16 (07/26 1400) BP: (103-142)/(60-77) 122/62 (07/26 1400) SpO2:  [97 %-100 %] 100 % (07/26 1400)   Physical Exam  Constitutional: Appears well-developed and well-nourished.  Psych: Cooperative on my examination, redirectable when agitated Eyes: No  scleral injection HENT: No oropharyngeal obstruction.  MSK: no joint deformities.  Cardiovascular: Normal rate and regular rhythm.  Respiratory: Effort normal, non-labored breathing GI: Soft.  No distension. There is no tenderness.  Skin: Warm dry and intact visible skin  Neuro: Mental Status: Patient is awake at times but then readily falls asleep during my examination, and oriented to person only, reporting he is at a different hospital and unable to identify the year or month correctly although he is able to identify his wife.  He has poor attention but with repeated questioning, naming and repetition appears intact and his speech is fluent though fairly dysarthric Cranial Nerves: II: Visual Fields are full to confrontation. Pupils are equal, round, and reactive to light.   III,IV, VI: EOMI without ptosis or diploplia.  V: Facial sensation is symmetric to temperature VII: Facial movement is symmetric.  VIII: hearing is intact to voice X: Uvula elevates symmetrically XI: Shoulder shrug is symmetric. XII: tongue is midline without atrophy or fasciculations.  Motor: Tone is normal. Bulk is normal. 5/5 strength was present in all four extremities.  Sensory: Sensation is symmetric to light touch in the arms and legs without extinction to double simultaneous stimuli Deep Tendon Reflexes: 2+ and symmetric in the patellae Toes are downgoing bilaterally.  Cerebellar: Unable to test given mental status  I have reviewed labs in epic and the results pertinent to this consultation are:   Basic Metabolic Panel: Recent Labs  Lab 01/08/21 1442 01/08/21 1851 01/08/21 2304 01/09/21 0320 01/09/21 0730 01/09/21 1600  NA 129* 131* 134* 136 136 136  K 3.5  --   --  3.9  --   --   CL 98  --   --  105  --   --   CO2 25  --   --  26  --   --   GLUCOSE 206*  --   --  192*  --   --   BUN 14  --   --  15  --   --   CREATININE 0.84  --   --  0.62  --   --   CALCIUM 7.9*  --   --  8.5*  --   --    MG  --   --   --  2.2  --   --   PHOS  --   --   --  3.0  --   --     CBC: Recent Labs  Lab 01/08/21 1442 01/09/21 0320  WBC 8.4 10.2  HGB 13.1 14.3  HCT 35.8* 39.2  MCV 91.6 91.0  PLT 227 283    Coagulation Studies: No results for input(s): LABPROT, INR in the last 72 hours.   Lab Results  Component Value Date   TSH 2.709 01/09/2021    No results found for: VITAMINB12  452 on 02/25/20  Unresulted Labs (From admission, onward)     Start     Ordered   01/10/21 0500  CBC with Differential/Platelet  Tomorrow morning,  R       Question:  Specimen collection method  Answer:  Lab=Lab collect   01/09/21 1122   01/10/21 [id number]  Basic metabolic panel  Tomorrow morning,   R       Question:  Specimen collection method  Answer:  Lab=Lab collect   01/09/21 1122   01/10/21 0500  Magnesium  Tomorrow morning,   R       Question:  Specimen collection method  Answer:  Lab=Lab collect   01/09/21 1122   01/10/21 0500  Phosphorus  Tomorrow morning,   R       Question:  Specimen collection method  Answer:  Lab=Lab collect   01/09/21 1122   01/09/21 1027  Hemoglobin A1c  Once,   R       Comments: To assess prior glycemic control   Question:  Specimen collection method  Answer:  Lab=Lab collect   01/09/21 1026   01/09/21 1023  RPR  Add-on,   AD       Question:  Specimen collection method  Answer:  Lab=Lab collect   01/09/21 1023   01/09/21 1015  Thyroid Panel  Add-on,   AD       Question:  Specimen collection method  Answer:  Lab=Lab collect   01/09/21 1014            I have reviewed the images obtained: Head CT from September 2020 compared to head CT from Oct 24, 2020 does show some slight interval progression of ventricular size (progressive obstructive hydrocephalus versus component of hydrocephalus ex vacuo) as well stable 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  MRI brain from November 17, 2020 did show progressive atrophy especially in the temporal lobes, stable lipoma   Impression:  Multifactorial hyperactive delirium.  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  However standard CSF testing and lumbar drainage trial prior to shunt placement would not have been safe in the setting of his fourth ventricle lipoma.  Chronic use of anticholinergic agents may be contributing to his cognitive dysfunction, and habituation to this medication may be exacerbating his delirium secondary to poor sleep.  Unfortunately he is felt to be unsafe for swallowing oral medications secondary to his agitation and intermittent somnolence.   Recommendations: -Reversible causes of dementia work-up (HIV, RPR, thiamine, repeat B12 given low normal prior result, ammonia) -Depakote 125 mg IV q6hr to wean precedex drip, additional 125 mg IV PRN to provide a loading dose if needed -Wean anticholinergic agent more gradually, will start with IV Benadryl 12.5 mg nightly as needed, ordered for 3 doses -Head CT to rule out complications from shunt placement that may need alternative management/monitoring -Continue to look for reversible causes of delirium (inadequate pain control must be balanced with the deliriogenic nature of opiates, continue to observe for infection, try to minimize disruptions overnight and keep patient awake during the day, keep patient interactions calm and quiet, try to minimize use of restraints with redirection by a sitter as soon as possible and safe from a postoperative perspective -SLP evaluation to help progress patient towards oral intake -Eventually on an outpatient basis he may benefit from more detailed cognitive testing and a sleep study  -Neurology will continue to follow  Lesleigh Noe MD-PhD [facility] [phone] [facility] coverage for [facility] is from 8 AM to 4 AM in-house and 4 PM to 8 PM by telephone/video. 8 PM to 8 AM emergent questions or overnight urgent questions  should be addressed to Teleneurology On-call or [facility] neurohospitalist; contact information can be found on AMION  Total critical care time: 45 minutes   Critical care time was exclusive of separately billable procedures and treating other patients.   Critical care was necessary to treat or prevent imminent or life-threatening deterioration.   Critical care was time spent personally by me on the following activities: development of treatment plan with patient and/or surrogate as well as nursing, discussions with consultants/primary team, evaluation of patient's response to treatment, examination of patient, obtaining history from patient or surrogate, ordering and performing treatments and interventions, ordering and review of laboratory studies, ordering and review of radiographic studies, and re-evaluation of patient's condition as needed, as documented above.

## 2021-01-09 NOTE — Progress Notes (Signed)
OT Cancellation Note  Patient Details Name: Eric [name] MRN: [id number] DOB: [birth year]   Cancelled Treatment:    Reason Eval/Treat Not Completed: Patient's level of consciousness;Other (comment).  Order received and chart reviewed. Spoke with pt primary RN regarding pt participation in OT services. Per RN, pt not consistently able to cooperate with therapy evaluation. Currently in restraints 2/2 being combative. Currently unsafe to engage in OT services at this time. Will continue to follow remotely and initiate OT services as available/pt medically appropriate.   Shara Blazing, M.S., OTR/L Ascom: [phone] 01/09/21, 1:12 PM

## 2021-01-09 NOTE — Progress Notes (Signed)
    Attending Progress Note  History: SNEHAL BINKS is POD#1 from right frontal VP shunt placement for hydrocephalus. He has dementia at baseline which was exacerbated by anesthesia from yesterday's surgery. He has remained in the ICU on Precedex for agitation.   Physical Exam: Vitals:   01/09/21 0500 01/09/21 0600  BP: 123/69 132/72  Pulse: (!) 52 (!) 51  Resp: 12 14  Temp:    SpO2: 98% 100%    AA to person and year  CN II-XII grossly intact  Strength:moving all extremities Symmetrically.  Data:  Recent Labs  Lab 01/08/21 1442 01/08/21 1851 01/09/21 0320 01/09/21 0730  NA 129*   < > 136 136  K 3.5  --  3.9  --   CL 98  --  105  --   CO2 25  --  26  --   BUN 14  --  15  --   CREATININE 0.84  --  0.62  --   GLUCOSE 206*  --  192*  --   CALCIUM 7.9*  --  8.5*  --    < > = values in this interval not displayed.   No results for input(s): AST, ALT, ALKPHOS in the last 168 hours.  Invalid input(s): TBILI   Recent Labs  Lab 01/08/21 1442 01/09/21 0320  WBC 8.4 10.2  HGB 13.1 14.3  HCT 35.8* 39.2  PLT 227 283   No results for input(s): APTT, INR in the last 168 hours.       Other tests/results: Shunt series shows good shunt placement.   Assessment/Plan:  HAMSE HUPE is a 75 y.o male s/p right frontal VP shunt. He continues to have post-op delirium on Precedex but appears improved and better oriented this morning.   - mobilize. Will consult PT/OT this morning - Wean Precedex per Intensivist - Hyponatremia improved this morning - wean restraints as tolerated - Will consult medicine for assistance with delirium and dispo planning   Cooper Render PA-C Department of Neurosurgery

## 2021-01-10 ENCOUNTER — Inpatient Hospital Stay: Payer: Medicare Other

## 2021-01-10 DIAGNOSIS — R410 Disorientation, unspecified: Secondary | ICD-10-CM | POA: Diagnosis not present

## 2021-01-10 DIAGNOSIS — R4182 Altered mental status, unspecified: Secondary | ICD-10-CM

## 2021-01-10 DIAGNOSIS — G911 Obstructive hydrocephalus: Secondary | ICD-10-CM | POA: Diagnosis not present

## 2021-01-10 LAB — BASIC METABOLIC PANEL
Anion gap: 5 (ref 5–15)
BUN: 18 mg/dL (ref 8–23)
CO2: 28 mmol/L (ref 22–32)
Calcium: 8.3 mg/dL — ABNORMAL LOW (ref 8.9–10.3)
Chloride: 104 mmol/L (ref 98–111)
Creatinine, Ser: 0.6 mg/dL — ABNORMAL LOW (ref 0.61–1.24)
GFR, Estimated: >60 mL/min (ref >60)
Glucose, Bld: 113 mg/dL — ABNORMAL HIGH (ref 70–99)
Potassium: 3.3 mmol/L — ABNORMAL LOW (ref 3.5–5.1)
Sodium: 137 mmol/L (ref 135–145)

## 2021-01-10 LAB — CBC WITH DIFFERENTIAL/PLATELET
Abs Immature Granulocytes: 0.05 10*3/uL (ref 0.00–0.07)
Basophils Absolute: 0 10*3/uL (ref 0.0–0.1)
Basophils Relative: 0 %
Eosinophils Absolute: 0 10*3/uL (ref 0.0–0.5)
Eosinophils Relative: 1 %
HCT: 37.9 % — ABNORMAL LOW (ref 39.0–52.0)
Hemoglobin: 13.8 g/dL (ref 13.0–17.0)
Immature Granulocytes: 1 %
Lymphocytes Relative: 11 %
Lymphs Abs: 1 10*3/uL (ref 0.7–4.0)
MCH: 33.3 pg (ref 26.0–34.0)
MCHC: 36.4 g/dL — ABNORMAL HIGH (ref 30.0–36.0)
MCV: 91.5 fL (ref 80.0–100.0)
Monocytes Absolute: 0.7 10*3/uL (ref 0.1–1.0)
Monocytes Relative: 7 %
Neutro Abs: 7 10*3/uL (ref 1.7–7.7)
Neutrophils Relative %: 80 %
Platelets: 243 10*3/uL (ref 150–400)
RBC: 4.14 MIL/uL — ABNORMAL LOW (ref 4.22–5.81)
RDW: 11.6 % (ref 11.5–15.5)
WBC: 8.8 10*3/uL (ref 4.0–10.5)
nRBC: 0 % (ref 0.0–0.2)

## 2021-01-10 LAB — HEMOGLOBIN A1C
Hgb A1c MFr Bld: 6.8 % — ABNORMAL HIGH (ref 4.8–5.6)
Mean Plasma Glucose: 148 mg/dL

## 2021-01-10 LAB — PHOSPHORUS: Phosphorus: 1.9 mg/dL — ABNORMAL LOW (ref 2.5–4.6)

## 2021-01-10 LAB — GLUCOSE, CAPILLARY
Glucose-Capillary: 109 mg/dL — ABNORMAL HIGH (ref 70–99)
Glucose-Capillary: 118 mg/dL — ABNORMAL HIGH (ref 70–99)
Glucose-Capillary: 153 mg/dL — ABNORMAL HIGH (ref 70–99)
Glucose-Capillary: 153 mg/dL — ABNORMAL HIGH (ref 70–99)
Glucose-Capillary: 118 mg/dL — ABNORMAL HIGH (ref 70–99)
Glucose-Capillary: 119 mg/dL — ABNORMAL HIGH (ref 70–99)
Glucose-Capillary: 144 mg/dL — ABNORMAL HIGH (ref 70–99)

## 2021-01-10 LAB — AMMONIA: Ammonia: 15 umol/L (ref 9–35)

## 2021-01-10 LAB — THYROID PANEL
Free Thyroxine Index: 1.1 — ABNORMAL LOW (ref 1.2–4.9)
T3 Uptake Ratio: 24 % (ref 24–39)
T4, Total: 4.5 ug/dL (ref 4.5–12.0)

## 2021-01-10 LAB — MAGNESIUM: Magnesium: 2.1 mg/dL (ref 1.7–2.4)

## 2021-01-10 LAB — RPR: RPR Ser Ql: NONREACTIVE

## 2021-01-10 LAB — HIV ANTIBODY (ROUTINE TESTING W REFLEX): HIV Screen 4th Generation wRfx: NONREACTIVE

## 2021-01-10 MED ORDER — CLONIDINE HCL 0.1 MG/24HR TD PTWK
0.1000 mg | MEDICATED_PATCH | TRANSDERMAL | Status: DC
  Administered 2021-01-10: 0.1 mg via TRANSDERMAL
  Filled 2021-01-10: qty 1

## 2021-01-10 MED ORDER — HALOPERIDOL LACTATE 5 MG/ML IJ SOLN
INTRAMUSCULAR | Status: AC
  Administered 2021-01-10: 5 mg via INTRAVENOUS
  Filled 2021-01-10: qty 1

## 2021-01-10 MED ORDER — POTASSIUM PHOSPHATES 15 MMOLE/5ML IV SOLN
30.0000 mmol | Freq: Once | INTRAVENOUS | Status: AC
  Administered 2021-01-10: 30 mmol via INTRAVENOUS
  Filled 2021-01-10: qty 10

## 2021-01-10 MED ORDER — RISPERIDONE 1 MG PO TBDP
0.5000 mg | ORAL_TABLET | Freq: Two times a day (BID) | ORAL | Status: DC
  Administered 2021-01-10 (×2): 0.5 mg via ORAL
  Filled 2021-01-10 (×4): qty 0.5

## 2021-01-10 MED ORDER — HALOPERIDOL LACTATE 5 MG/ML IJ SOLN: 5.0000 mg | Freq: Once | INTRAMUSCULAR | Status: AC

## 2021-01-10 MED ORDER — VALPROATE SODIUM 100 MG/ML IV SOLN
500.0000 mg | Freq: Four times a day (QID) | INTRAVENOUS | Status: DC
  Administered 2021-01-10 – 2021-01-17 (×29): 500 mg via INTRAVENOUS
  Filled 2021-01-10 (×33): qty 5

## 2021-01-10 MED ORDER — RISPERIDONE 1 MG PO TBDP: 0.5000 mg | ORAL_TABLET | Freq: Two times a day (BID) | ORAL | Status: DC

## 2021-01-10 MED ORDER — THIAMINE HCL 100 MG/ML IJ SOLN
100.0000 mg | Freq: Every day | INTRAMUSCULAR | Status: DC
  Administered 2021-01-10 – 2021-01-21 (×12): 100 mg via INTRAVENOUS
  Filled 2021-01-10 (×13): qty 2

## 2021-01-10 MED ORDER — FENTANYL CITRATE (PF) 100 MCG/2ML IJ SOLN
25.0000 ug | INTRAMUSCULAR | Status: DC | PRN
  Administered 2021-01-10: 25 ug via INTRAVENOUS
  Filled 2021-01-10: qty 2

## 2021-01-10 MED ORDER — VALPROATE SODIUM 100 MG/ML IV SOLN
500.0000 mg | Freq: Four times a day (QID) | INTRAVENOUS | Status: DC
  Filled 2021-01-10: qty 5

## 2021-01-10 MED ORDER — HEPARIN SODIUM (PORCINE) 5000 UNIT/ML IJ SOLN
5000.0000 [IU] | Freq: Three times a day (TID) | INTRAMUSCULAR | Status: DC
  Administered 2021-01-10 – 2021-01-31 (×58): 5000 [IU] via SUBCUTANEOUS
  Filled 2021-01-10 (×59): qty 1

## 2021-01-10 MED ORDER — CLONIDINE HCL 0.2 MG/24HR TD PTWK: 0.2000 mg | MEDICATED_PATCH | TRANSDERMAL | Status: DC

## 2021-01-10 NOTE — Addendum Note (Signed)
Addendum  created 01/10/21 0928 by Iran Ouch, MD   Review and Sign - Ready for Procedure, Review and Sign - Signed

## 2021-01-10 NOTE — Progress Notes (Signed)
Patient continues to be agitated and combative. He has started cursing at staff and using racial slurs in reference to staff. Patient was told that is unacceptable behavior. Remains in bilateral wrist restraints and Precedex increased.

## 2021-01-10 NOTE — Progress Notes (Signed)
Patient has become very disruptive. Thrashing, kicking and screaming out. Precedex increased.

## 2021-01-10 NOTE — Progress Notes (Signed)
Eeg done 

## 2021-01-10 NOTE — Progress Notes (Signed)
NAME:  Eric [name], MRN:  [id number], DOB:  12-22-1945, LOS: 2 ADMISSION DATE:  01/08/2021, CONSULTATION DATE:  01/08/2021 REFERRING MD:  Dr. Lacinda Axon, CHIEF COMPLAINT:  Agitation/Delirum   Brief Pt Description / Synopsis:  75 y.o. male with Hydrocephalus status post elective Right Frontal VP Shunt insertion.  Post-op with acute delirium/agitation felt to be due to complication of anesthesia plus Hyponatremia (Na+ 129) requiring Precedex drip.  History of Present Illness:  Eric [name] is a 74 y.o. Male with a past medical history as listed below, who presented for elective Right frontal VP shunt insertion on 01/08/21 due to Hydrocephalus.  Post-op in PACU, he was noted to be awake, but confused/combative/uncooperative.  He was given Fentanyl and Haldol without improvement in mental status.  He was then transferred to ICU.  Upon arrival to ICU he remains very agitated and combative.  Dr. Lacinda Axon with Neurosurgery has consulted PCCM for assistance with management of agitation/delirium.  He was placed on Precedex drip.  Workup shows Sodium 129 and glucose 206, all other labs unremarkable.  Urinalysis, serum osmolality, urine osmolality and urine sodium are currently pending.  Pertinent  Medical History  Hydrocephalus Dementia BPH Hyperlipidemia GERD Diabetes mellitus Anxiety Arthritis  Micro Data:  01/04/21: SARS-CoV-2 antigen>>negative 01/08/21: MRSA PCR>>negative  Antimicrobials:  Cefazolin 7/25 x1 dose (surgical prophylaxis)  Significant Hospital Events: Including procedures, antibiotic start and stop dates in addition to other pertinent events   01/08/21: underwent elective right frontal VQ shunt.  Post-op with delirium/agitation requiring Precedex gtt.  PCCM consulted. Found to be Hyponatremic (Na 129) 01/09/21: pt with continued delirium/combativeness still requiring Precedex gtt.  Will consult neurology  01/10/21: Pt with worsening agitation overnight despite scheduled depacon; precedex  gtt; prn fentanyl, and benadryl.  Felt benadryl made delirium worse   Interim History / Subjective:  -Patient arrived to ICU status post right VP shunt placement, with severe agitation felt to be secondary to anesthesia with history of dementia -PCCM consulted for assistance with management of delirium -Pt with worsening agitation overnight   Objective   Blood pressure 130/64, pulse (!) 55, temperature 97.6 F (36.4 C), temperature source Axillary, resp. rate 16, SpO2 95 %.        Intake/Output Summary (Last 24 hours) at 01/10/2021 0809 Last data filed at 01/10/2021 0800 Gross per 24 hour  Intake 613.42 ml  Output 825 ml  Net -211.58 ml   There were no vitals filed for this visit.  Examination: General: Acutely ill appearing elderly male, NAD, with intermittent agitation/combativeness  HENT: Status post right frontal VP shunt placement, dressing clean dry and intact at incision site, neck supple, no JVD Lungs: Clear to auscultation bilaterally, no rales or wheezing noted, even, nonlabored Cardiovascular: Regular rate and rhythm, S1-S2, no murmurs, rubs, gallops Abdomen: Rounded, soft, nontender, nondistended, no guarding rebound tenderness, bowel sounds positive x4 Extremities: Normal bulk and tone, no deformities, no edema Neuro: sedated, intermittent agitation/combativeness, PERRL GU: indwelling foley in place, penile cyanosis resolved   Resolved Hospital Problem list   Hyponatremia   Assessment & Plan:   Acute Delirium/Agitation, suspect secondary to Anesthesia +   Status post elective Right Frontal VP Shunt insertion 07/25 Hyponatremia PMHx of Dementia and Falls  -Provide supportive care -Promote normal sleep/wake cycle: continue outpatient melatonin -Will attempt to wean precedex gtt as tolerated  -Will consult neurology given worsening dementia and now postop agitation/delirium hx and appreciate input  -TSH~2.709 07/26; thyroid panel results pending  -Will start  risperdal and increase depacon  dose to 500 mg bid; discontinue benadryl   HTN Hx: HLD -Continuous telemetry monitoring -Continue outpatient antihypertensives  Hyponatremia-resolved  Hypophosphatemia  -Monitor I&O's / urinary output -Follow BMP -Ensure adequate renal perfusion -Avoid nephrotoxic agents as able -Replace electrolytes as indicated -Serum osmolality 283/urine osmolality 586/urine sodium 152 : IV fluids discontinued  -Goal rate of correction 4-6 mEq in 24 hr period (max rate of correction 8-10 mEq in 24 hr)  Hydrocephalus s/p Right Frontal VP Shunt -Neurosurgery is primary service, will follow recommendations -CT Head pending to assess Right Frontal VP Shunt  -Follow serial Neuro exams  Penile cyanosis/pain secondary to paraphimosis s/p reduction of foreskin and glans penis per urology 07/26-resolved  Hx: BPH -Monitor UOP  -Continue outpatient flomax and finasteride   Type II diabetes mellitus  -CBG's q4hrs  -SSI   Best Practice (right click and "Reselect all SmartList Selections" daily)  Diet/type: Clear Liquid Diet Advance As Tolerated once pt can safely tolerate po's  DVT prophylaxis: SCD GI prophylaxis: PPI Lines: N/A Foley:  Yes, and it is still needed Code Status:  full code Last date of multidisciplinary goals of care discussion [07/27]   Labs   CBC: Recent Labs  Lab 01/08/21 1442 01/09/21 0320 01/10/21 0441  WBC 8.4 10.2 8.8  NEUTROABS  --   --  7.0  HGB 13.1 14.3 13.8  HCT 35.8* 39.2 37.9*  MCV 91.6 91.0 91.5  PLT 227 283 0000000    Basic Metabolic Panel: Recent Labs  Lab 01/08/21 1442 01/08/21 1851 01/08/21 2304 01/09/21 0320 01/09/21 0730 01/09/21 1600 01/10/21 0441  NA 129*   < > 134* 136 136 136 137  K 3.5  --   --  3.9  --   --  3.3*  CL 98  --   --  105  --   --  104  CO2 25  --   --  26  --   --  28  GLUCOSE 206*  --   --  192*  --   --  113*  BUN 14  --   --  15  --   --  18  CREATININE 0.84  --   --  0.62  --   --  0.60*   CALCIUM 7.9*  --   --  8.5*  --   --  8.3*  MG  --   --   --  2.2  --   --  2.1  PHOS  --   --   --  3.0  --   --  1.9*   < > = values in this interval not displayed.   GFR: Estimated Creatinine Clearance: 84.6 mL/min (A) (by C-G formula based on SCr of 0.6 mg/dL (L)). Recent Labs  Lab 01/08/21 1442 01/09/21 0320 01/10/21 0441  WBC 8.4 10.2 8.8    Liver Function Tests: No results for input(s): AST, ALT, ALKPHOS, BILITOT, PROT, ALBUMIN in the last 168 hours. No results for input(s): LIPASE, AMYLASE in the last 168 hours. Recent Labs  Lab 01/10/21 0441  AMMONIA 15    ABG No results found for: PHART, PCO2ART, PO2ART, HCO3, TCO2, ACIDBASEDEF, O2SAT   Coagulation Profile: No results for input(s): INR, PROTIME in the last 168 hours.  Cardiac Enzymes: No results for input(s): CKTOTAL, CKMB, CKMBINDEX, TROPONINI in the last 168 hours.  HbA1C: Hgb A1c MFr Bld  Date/Time Value Ref Range Status  01/09/2021 03:20 AM 6.8 (H) 4.8 - 5.6 % Final    Comment:    (  NOTE)         Prediabetes: 5.7 - 6.4         Diabetes: >6.4         Glycemic control for adults with diabetes: <7.0     CBG: Recent Labs  Lab 01/09/21 1608 01/09/21 1929 01/09/21 2353 01/10/21 0416 01/10/21 0736  GLUCAP 124* 149* 142* 109* 118*    Review of Systems: Positives in BOLD   Unable to assess pt sedated and confused  Past Medical History:  He,  has a past medical history of Anxiety, Arthritis, 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 symptoms, Diabetes ([place]), Fall, GERD (gastroesophageal reflux disease), HLD (hyperlipidemia), Hydrocephalus ([place]), Hypertension, and Unsteady gait.   Surgical History:   Past Surgical History:  Procedure Laterality Date   arm surgery Left    fracture   COLONOSCOPY     TONSILLECTOMY  1962   VENTRICULOPERITONEAL SHUNT Right 01/08/2021   Procedure: RIGHT FRONTAL VENTRICULOPERITONEAL SHUNT INSERTION;  Surgeon: Deetta Perla, MD;  Location: [facility] ORS;  Service:  Neurosurgery;  Laterality: Right;     Social History:   reports that he quit smoking about 15 years ago. His smoking use included cigarettes. He has a 84.00 pack-year smoking history. He has never used smokeless tobacco. He reports that he does not drink alcohol and does not use drugs.   Family History:  His family history is negative for Prostate cancer, Kidney cancer, and Bladder Cancer.   Allergies No Known Allergies   Home Medications  Prior to Admission medications   Medication Sig Start Date End Date Taking? Authorizing Provider  aspirin EC 81 MG tablet Take 81 mg by mouth daily. Swallow whole.   Yes [provider]  diphenhydrAMINE HCl (ZZZQUIL) 50 MG/30ML LIQD Take 30 mLs by mouth daily as needed (Sleep).   Yes [provider]  finasteride (PROSCAR) 5 MG tablet Take 1 tablet (5 mg total) by mouth daily. Patient taking differently: Take 5 mg by mouth at bedtime. 11/01/16  Yes McGowan, Larene Beach A, PA-C  ibuprofen (ADVIL) 200 MG tablet Take 200 mg by mouth every 6 (six) hours as needed for mild pain or moderate pain.   Yes [provider]  lisinopril-hydrochlorothiazide (ZESTORETIC) 20-25 MG tablet Take 1 tablet by mouth every morning. 12/07/20  Yes [provider]  Melatonin 10 MG CAPS Take 10 mg by mouth at bedtime.   Yes [provider]  Multiple Vitamins-Minerals (CENTRUM SILVER ULTRA WOMENS) TABS Take 1 tablet by mouth daily. 07/06/07  Yes [provider]  omeprazole (PRILOSEC) 20 MG capsule Take 20 mg by mouth every other day. At bedtime 01/30/16  Yes [provider]  tamsulosin (FLOMAX) 0.4 MG CAPS capsule Take 1 capsule (0.4 mg total) by mouth daily. Patient taking differently: Take 0.4 mg by mouth daily after supper. 11/01/16  Yes McGowan, Larene Beach A, PA-C  bismuth subsalicylate (PEPTO BISMOL) 262 MG/15ML suspension Take 30 mLs by mouth every 6 (six) hours as needed.    [provider]     Critical care time:  35 minutes     Rosilyn Mings, [place] Pager [phone] (please enter 7 digits) PCCM Consult Pager [phone] (please enter 7 digits)

## 2021-01-10 NOTE — Progress Notes (Signed)
PT Cancellation Note  Patient Details Name: DOMINICK GUELI MRN: [id number] DOB: 04-20-1946   Cancelled Treatment:    Reason Eval/Treat Not Completed: Patient's level of consciousness Spoke with nurse who reports that pt has continued to have agitation and is sedated at this time.  States she will message this PT if pt is able/appropriate for PT.  Kreg Shropshire, DPT 01/10/2021, 9:18 AM

## 2021-01-10 NOTE — Progress Notes (Addendum)
Neurology Progress Note  Patient ID: Eric [name] is a 75 y.o. 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  Initially consulted for: Agitated delirium  Major interval events/Subjective: -Sleeping after receiving an increased dose of Depakote this morning as well as risperidone -Severely increased agitation after receiving Benadryl 12.5 mg last night, concerning for paradoxical reaction to Benadryl which was subsequently discontinued  Exam: Vitals:   01/10/21 0700 01/10/21 0800  BP: 138/65 130/64  Pulse: 61 (!) 55  Resp: (!) 22 16  Temp:    SpO2: 97% 95%   Gen: In bed, comfortable  Resp: non-labored breathing, no grossly audible wheezing Cardiac: Perfusing extremities well  Abd: soft, nt  Neuro: MS: Sleeping, did not disturb given that he was heading down for CT scan and did not want to agitate him   Pertinent Labs:        TSH 2.709 01/09/2021   T4TOTAL 4.5 01/09/2021          Ammonia  15 01/09/2021   HGBA1C 6.8 (H) 01/09/2021     Pending: HIV, RPR, B12, thiamine   Head CT 7/27 personally reviewed, reassuring against any surgical complications  Impression: Multifactorial hyperactive delirium.  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  However standard CSF testing and lumbar drainage trial prior to shunt placement would not have been safe in the setting of his fourth ventricle lipoma.  Chronic use of anticholinergic agents may be contributing to his cognitive dysfunction, and habituation to this medication may be exacerbating his delirium secondary to poor sleep.   Recommendations: -AdMark  (Alzheimer's biomarkers) added onto CSF testing, can be followed up by an outpatient neurologist given will likely take many days to result -Riperidone 0.5 mg BID started by primary team, reviewed with wife that antipsychotics do have a black box warning for all cause mortality in patients with dementia and ideally would be weaned prior to his discharge.  However given his significant agitation at times even at home, the risk of the medication may be outweighed by the benefit in terms of being able to keep him home and manage his behavior.  She expressed her understanding and acceptance of these risks given the benefit at this time -Depakote continue 500 mg IV q6hr to wean precedex drip  -level 7/28 AM to confirm appropriate range  -Consider clonidine patch, dosing per ICU team -Routine EEG to rule out intermittent seizures contributing to his mental status, feel that this is unlikely but possible given his significant cortical atrophy -Continue to look for reversible causes of delirium (inadequate pain control must be balanced with the deliriogenic nature of opiates, continue to observe for infection, try to minimize disruptions overnight and keep patient awake during the day, keep patient interactions calm and quiet, try to minimize use of restraints with redirection by a sitter as soon as possible and safe from a postoperative perspective -SLP evaluation to help progress patient towards oral intake -Would like to trial trazodone for sleep / sundowning if able -Neurology will continue to follow  Eric Noe MD-PhD [facility] [phone]   CRITICAL CARE Performed by: Eric [name]   Total critical care time: 35 minutes  Critical care time was exclusive  of separately billable procedures and treating other patients.  Critical care was necessary to treat or prevent imminent or life-threatening deterioration, in this patient with severe agitated delirium requiring drips for  management  Critical care was time spent personally by me on the following activities: development of treatment plan with patient and/or surrogate as well as nursing, discussions with consultants, evaluation of patient's response to treatment, examination of patient, obtaining history from patient or surrogate, ordering and performing treatments and interventions, ordering and review of laboratory studies, ordering and review of radiographic studies, pulse oximetry and re-evaluation of patient's condition.

## 2021-01-10 NOTE — Progress Notes (Signed)
PT Cancellation Note  Patient Details Name: Eric [name] MRN: [id number] DOB: Oct 18, 1945   Cancelled Treatment:    Reason Eval/Treat Not Completed: Patient's level of consciousness Spoke with nurse this afternoon, pt needing continued sedation (for EEG) and not appropriate for PT/activity this date.  Will maintain on caseload and continue to follow remotely.   Kreg Shropshire, DPT 01/10/2021, 4:27 PM

## 2021-01-10 NOTE — Procedures (Signed)
Patient Name: Eric [name]  MRN: [id number]  Epilepsy Attending: Lora Havens  Referring Physician/Provider: Dr Lesleigh Noe Date: 01/10/2021 Duration: 28.15 mins  Patient history: 75yo F with ams. EEG to evaluate for seizure.   Level of alertness: comatose  AEDs during EEG study: VPA  Technical aspects: This EEG study was done with scalp electrodes positioned according to the 10-20 International system of electrode placement. Electrical activity was acquired at a sampling rate of '500Hz'$  and reviewed with a high frequency filter of '70Hz'$  and a low frequency filter of '1Hz'$ . EEG data were recorded continuously and digitally stored.   Description: EEG showed continuous generalized 3 to 6 Hz theta-delta slowing as well as intermittent 15-'18hz'$  generalized beta activity. Hyperventilation and photic stimulation were not performed.     ABNORMALITY - Continuous slow, generalized  IMPRESSION: This study is suggestive of severe diffuse encephalopathy, nonspecific etiology. No seizures or epileptiform discharges were seen throughout the recording.  Enzo Treu Barbra Sarks

## 2021-01-10 NOTE — Progress Notes (Signed)
   Progress Note  History: Eric [name] is POD#2 from right frontal VP shunt placement for hydrocephalus. He has dementia at baseline which was exacerbated by anesthesia from his recent surgery. He has remained in the ICU on Precedex and PRNs for agitation. Overnight was complicated by worsening agitation and combative behavior. This has lead to delayed CT scan. Therapy services declined to see the patient due to his cognitive status. Pt seen by Urology yesterday for penial cyanosis which resolved with reduction of his foreskin. Urology signed off.  Physical Exam: Vitals:   01/10/21 0300 01/10/21 0400  BP: 124/62 (!) 117/55  Pulse: 62 (!) 56  Resp: (!) 21 17  Temp:    SpO2: 98% 96%    AA to person and year.  Following simple one step commands.   Data:  Recent Labs  Lab 01/08/21 1442 01/08/21 1851 01/09/21 0320 01/09/21 0730 01/10/21 0441  NA 129*   < > 136   < > 137  K 3.5  --  3.9  --  3.3*  CL 98  --  105  --  104  CO2 25  --  26  --  28  BUN 14  --  15  --  18  CREATININE 0.84  --  0.62  --  0.60*  GLUCOSE 206*  --  192*  --  113*  CALCIUM 7.9*  --  8.5*  --  8.3*   < > = values in this interval not displayed.   No results for input(s): AST, ALT, ALKPHOS in the last 168 hours.  Invalid input(s): TBILI   Recent Labs  Lab 01/08/21 1442 01/09/21 0320 01/10/21 0441  WBC 8.4 10.2 8.8  HGB 13.1 14.3 13.8  HCT 35.8* 39.2 37.9*  PLT 227 283 243   No results for input(s): APTT, INR in the last 168 hours.       Other tests/results: Stunt series completed post-op without noted complication. CT head pending.   Assessment/Plan:  Eric [name]  is a 75 y.o male s/p right frontal VP shunt. He continues to have post-op delirium requiring Precedex and additional PRNs  - Agree with decision to avoid Benadryl  - continue to attempt to wean Precedex  - Wean restraints as tolerated and mobilize when possible  - avoid narcotics when possible  - Prioritize pending head CT   - DVT prophylaxis pending HCT result  - agree with decision to obtain sitter - Will consult medicine for assistance with delirium and dispo planning - PTOT - defer until pt is more cooperative   Cooper Render PA-C Department of Neurosurgery

## 2021-01-10 NOTE — Progress Notes (Signed)
SLP Cancellation Note  Patient Details Name: Eric [name] MRN: [id number] DOB: 1945-08-20   Cancelled treatment:       Reason Eval/Treat Not Completed: Medical issues which prohibited therapy  Pt currently sedated d/t agitation. [name] stated plan was for pt to have another Head CT with hopes of weaning precedex.  ST to follow chart for appropriateness and will re-attempt at first available time.   Lesean Woolverton B. Rutherford Nail M.S., CCC-SLP, [place] Office [phone]   Romayne Ticas Rutherford Nail 01/10/2021, 10:23 AM

## 2021-01-10 NOTE — Progress Notes (Signed)
Patient became highly agitated again with aggressive physical behavior and verbal outbursts. Restraints reapplied-trial off failed.

## 2021-01-10 NOTE — Progress Notes (Signed)
Patient transported to and form CT department without issue. He tolerated the imaging very well. Returned to room, resting comfortably.

## 2021-01-10 NOTE — Progress Notes (Signed)
Neuro: Patient has required frequent titration of precedex throughout the day. He has been agitated, combative, hallucinating/paranoid at times, and verbally/physically threatening. The patient has used racial slurs and has used inappropriate language toward staff throughout the day. He failed a trial off bilateral wrist restraints this AM; remains in them for majority of shift.  Resp: stable on room air throughout shift CV: afebrile, vital signs fairly stable GIGU: foley in place, NPO due to safety concerns for aspiration, no BM Skin: intact with some scattered bruising; surgical sites clean dry and intact Social: Wife at the bedside throughout the day. Disruption in care significantly decreased moving forward in the day. She is supportive and asks appropriate questions. All questions and concerns addressed.

## 2021-01-10 NOTE — Significant Event (Signed)
Patient loosened bilateral restraints and pulled right forearm IV out; myself and Alver Fisher, RN attempted to redirect patient, but patient became combative, verbally abusive, kicking and punching at nurses. NP Ouma and security called to bedside. New orders given.   0216 Patient is currently safely in bed, continuous to be restless, and still in restraints. Vital signs stable; no changes in neurological status.

## 2021-01-10 NOTE — Progress Notes (Signed)
Neuro: titrating precedex to behavior, intermittent outbursts with improvement from yesterday and overnight, see MAR for added medication Resp: Stable on room air CV: afebrile, fairly stable vital signs edema in upper extremities GIGU: foley in place, no cyanosis to meatus, no BM, NPO pending swallow eval when neuro status more stable  Skin: intact, surgical sites CDI, PIV infiltrate R FA, edema and tenderness-improving Social: Wife at bedside throughout the day, all questions and concerns addressed.  Events: CT head and EEG complete-sedation increased for cooperation

## 2021-01-10 NOTE — Progress Notes (Signed)
OT Cancellation Note  Patient Details Name: Eric [name] MRN: [id number] DOB: 09/29/1945   Cancelled Treatment:    Reason Eval/Treat Not Completed: Patient's level of consciousness;Other (comment). OT continues to follow pt for evaluation. Per chart, pt noted to be sedated due to agitation. Will continue to hold until pt able to safely participate in OT evaluation. Thank you.   Shara Blazing, M.S., OTR/L Ascom: [phone] 01/10/21, 11:35 AM

## 2021-01-10 NOTE — Progress Notes (Signed)
Patient continues to be agitated and combative. He has attempted to kick and punch staff members.  He continues to curse and use racial slurs toward staff members. Care team is working on a plan to take patient to CT to help evaluate his neuro status as well as a new medication plan. RN sitting at bedside for continuous monitoring. He requires frequent reassuring and redirecting which remains difficult, mostly ineffective.

## 2021-01-11 DIAGNOSIS — F29 Unspecified psychosis not due to a substance or known physiological condition: Secondary | ICD-10-CM

## 2021-01-11 DIAGNOSIS — Z982 Presence of cerebrospinal fluid drainage device: Secondary | ICD-10-CM | POA: Diagnosis not present

## 2021-01-11 DIAGNOSIS — G918 Other hydrocephalus: Secondary | ICD-10-CM | POA: Diagnosis not present

## 2021-01-11 DIAGNOSIS — G911 Obstructive hydrocephalus: Secondary | ICD-10-CM | POA: Diagnosis not present

## 2021-01-11 DIAGNOSIS — R410 Disorientation, unspecified: Secondary | ICD-10-CM | POA: Diagnosis not present

## 2021-01-11 LAB — CBC WITH DIFFERENTIAL/PLATELET
Abs Immature Granulocytes: 0.05 10*3/uL (ref 0.00–0.07)
Basophils Absolute: 0 10*3/uL (ref 0.0–0.1)
Basophils Relative: 0 %
Eosinophils Absolute: 0.1 10*3/uL (ref 0.0–0.5)
Eosinophils Relative: 1 %
HCT: 39.3 % (ref 39.0–52.0)
Hemoglobin: 13.9 g/dL (ref 13.0–17.0)
Immature Granulocytes: 1 %
Lymphocytes Relative: 11 %
Lymphs Abs: 1 10*3/uL (ref 0.7–4.0)
MCH: 32.9 pg (ref 26.0–34.0)
MCHC: 35.4 g/dL (ref 30.0–36.0)
MCV: 92.9 fL (ref 80.0–100.0)
Monocytes Absolute: 0.7 10*3/uL (ref 0.1–1.0)
Monocytes Relative: 9 %
Neutro Abs: 6.7 10*3/uL (ref 1.7–7.7)
Neutrophils Relative %: 78 %
Platelets: 233 10*3/uL (ref 150–400)
RBC: 4.23 MIL/uL (ref 4.22–5.81)
RDW: 11.6 % (ref 11.5–15.5)
WBC: 8.6 10*3/uL (ref 4.0–10.5)
nRBC: 0 % (ref 0.0–0.2)

## 2021-01-11 LAB — GLUCOSE, CAPILLARY
Glucose-Capillary: 110 mg/dL — ABNORMAL HIGH (ref 70–99)
Glucose-Capillary: 123 mg/dL — ABNORMAL HIGH (ref 70–99)
Glucose-Capillary: 118 mg/dL — ABNORMAL HIGH (ref 70–99)
Glucose-Capillary: 119 mg/dL — ABNORMAL HIGH (ref 70–99)
Glucose-Capillary: 128 mg/dL — ABNORMAL HIGH (ref 70–99)
Glucose-Capillary: 95 mg/dL (ref 70–99)

## 2021-01-11 LAB — BASIC METABOLIC PANEL
Anion gap: 9 (ref 5–15)
BUN: 18 mg/dL (ref 8–23)
CO2: 25 mmol/L (ref 22–32)
Calcium: 8.2 mg/dL — ABNORMAL LOW (ref 8.9–10.3)
Chloride: 102 mmol/L (ref 98–111)
Creatinine, Ser: 0.67 mg/dL (ref 0.61–1.24)
GFR, Estimated: >60 mL/min (ref >60)
Glucose, Bld: 109 mg/dL — ABNORMAL HIGH (ref 70–99)
Potassium: 3.7 mmol/L (ref 3.5–5.1)
Sodium: 136 mmol/L (ref 135–145)

## 2021-01-11 LAB — VALPROIC ACID LEVEL: Valproic Acid Lvl: 56 ug/mL (ref 50.0–100.0)

## 2021-01-11 LAB — PHOSPHORUS: Phosphorus: 2.9 mg/dL (ref 2.5–4.6)

## 2021-01-11 LAB — MAGNESIUM: Magnesium: 2.2 mg/dL (ref 1.7–2.4)

## 2021-01-11 LAB — VITAMIN B12: Vitamin B-12: 332 pg/mL (ref 180–914)

## 2021-01-11 MED ORDER — RISPERIDONE 1 MG PO TBDP
0.5000 mg | ORAL_TABLET | Freq: Every day | ORAL | Status: DC
  Filled 2021-01-11 (×2): qty 0.5

## 2021-01-11 MED ORDER — HALOPERIDOL LACTATE 5 MG/ML IJ SOLN
2.0000 mg | Freq: Four times a day (QID) | INTRAMUSCULAR | Status: DC | PRN
  Administered 2021-01-11 – 2021-01-13 (×2): 2 mg via INTRAVENOUS
  Filled 2021-01-11 (×2): qty 1

## 2021-01-11 NOTE — Evaluation (Signed)
Clinical/Bedside Swallow Evaluation Patient Details  Name: Eric [name] MRN: [id number] Date of Birth: 1946/01/27  Today's Date: 01/11/2021 Time: SLP Start Time (ACUTE ONLY): 43 SLP Stop Time (ACUTE ONLY): [id number] SLP Time Calculation (min) (ACUTE ONLY): 20 min  Past Medical History:  Past Medical History:  Diagnosis Date   Anxiety    Arthritis    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 symptoms    Diabetes ([place])    no meds-last a1c 5.7   Fall    GERD (gastroesophageal reflux disease)    HLD (hyperlipidemia)    Hydrocephalus ([place])    Hypertension    Unsteady gait    Past Surgical History:  Past Surgical History:  Procedure Laterality Date   arm surgery Left    fracture   COLONOSCOPY     TONSILLECTOMY  1962   VENTRICULOPERITONEAL SHUNT Right 01/08/2021   Procedure: RIGHT FRONTAL VENTRICULOPERITONEAL SHUNT INSERTION;  Surgeon: Deetta Perla, MD;  Location: [facility] ORS;  Service: Neurosurgery;  Laterality: Right;   HPI:  Eric [name] is a 75 y.o. 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 HE is status post elective Right Frontal VP Shunt insertion.  Post-op with acute delirium/agitation felt to be due to complication of anesthesia requiring Precedex drip. 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   Assessment / Plan / Recommendation Clinical Impression  Pt was awake, alert, responsive to questions, able to follow directions, timely responses - stated that he felt "awful" and that he was thirsty. Pt's mentation continues to be altered but his alertness is greatly improved and as a result, pt was safe for PO trials. Pt demonstrated severe s/s concerning for aspiration at  bedside when consuming ice chips. Pt appeared to orally manipulation bolus and his swallow appeared swift. However his respirations became extremely wet after each swallow with his RR climbing to 25. When prompted to cough, pt was able to clear the wetness but RR remained high. Out of an abundance of caution, remain pt be NPO with plan for pt to be followed by ST for further trials of POs. Also recommend a Palliative Care consult to establish [place] given concern of oropharyngeal dysphagia and pt's ability to sustain nutrition and hydration. SLP Visit Diagnosis: Dysphagia, unspecified (R13.10)    Aspiration Risk  Severe aspiration risk;Risk for inadequate nutrition/hydration    Diet Recommendation NPO   Medication Administration: Via alternative means    Other  Recommendations Oral Care Recommendations: Oral care QID   Follow up Recommendations  (Palliative Care consult)      Frequency and Duration min 2x/week  2 weeks       Prognosis Prognosis for Safe Diet Advancement: Fair Barriers to Reach Goals: Cognitive deficits;Severity of deficits;Behavior;Medication      Swallow Study   General Date of Onset: 01/08/21 HPI: Eric [name] is a 75 y.o. 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 HE is status post elective Right Frontal VP Shunt insertion.  Post-op with acute delirium/agitation felt to be due to complication of anesthesia requiring Precedex drip. 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 Type of Study: Bedside Swallow Evaluation Previous Swallow Assessment: none in chart Diet Prior to this Study: Thin liquids Temperature Spikes Noted: No Respiratory Status: Room  air History of Recent Intubation:  (procedure only) Behavior/Cognition: Alert;Confused Oral Cavity Assessment: Dried secretions Oral Care Completed by SLP: Recent completion by staff Oral Cavity - Dentition: Edentulous Self-Feeding Abilities: Total assist Patient Positioning: Upright in bed Baseline Vocal Quality: Normal Volitional Cough: Cognitively unable to elicit Volitional Swallow: Unable to elicit    Oral/Motor/Sensory Function Overall Oral Motor/Sensory Function: Generalized oral weakness   Ice Chips Ice chips: Impaired Presentation: Spoon Pharyngeal Phase Impairments: Suspected delayed Swallow;Multiple swallows;Wet Vocal Quality;Change in Vital Signs   Thin Liquid Thin Liquid: Not tested    Nectar Thick Nectar Thick Liquid: Not tested   Honey Thick Honey Thick Liquid: Not tested   Puree Puree: Not tested   Solid     Solid: Not tested     Eric [name] B. Rutherford Nail M.S., CCC-SLP, [place] Pathologist [facility] Services Office [place] 01/11/2021,4:01 PM
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
## 2021-01-11 NOTE — Progress Notes (Signed)
   01/11/21 1100  Clinical Encounter Type  Visited With Patient  Visit Type Initial  Referral From Chaplain  Consult/Referral To Chaplain  Spiritual Encounters  Spiritual Needs Emotional;Prayer   Offered emotional and spiritual support

## 2021-01-11 NOTE — Progress Notes (Signed)
GOALS OF CARE DISCUSSION  The Clinical status was relayed to wife  in detail.  Updated and notified of patients medical condition. Patient had previous delirium with anaesthesia in the past   Patient remains unresponsive and will not open eyes to command.   Patient is having a weak cough and struggling to remove secretions.   Explained to family course of therapy and the modalities  Plan to wean precedex to off   Patient with Progressive multiorgan failure with a very high probablity of a very minimal chance of meaningful recovery despite all aggressive and optimal medical therapy.  PATIENT REMAINS FULL CODE  Family understands the situation.   Family are satisfied with Plan of action and management. All questions answered  Additional CC time 23 mins   Mailin Coglianese Patricia Pesa, M.D.  [facility]  Medical Director [place] Director [facility] Cardio-Pulmonary Department

## 2021-01-11 NOTE — Progress Notes (Signed)
NAME:  Eric [name], MRN:  [id number], DOB:  July 26, 1945, LOS: 3 ADMISSION DATE:  01/08/2021, CONSULTATION DATE:  01/08/2021 REFERRING MD:  Dr. Lacinda Axon, CHIEF COMPLAINT:  Agitation/Delirum   Brief Pt Description / Synopsis:  75 y.o. male with Hydrocephalus status post elective Right Frontal VP Shunt insertion.  Post-op with acute delirium/agitation felt to be due to complication of anesthesia plus Hyponatremia (Na+ 129) requiring Precedex drip.  History of Present Illness:  Eric [name] is a 75 y.o. Male with a past medical history as listed below, who presented for elective Right frontal VP shunt insertion on 01/08/21 due to Hydrocephalus.  Post-op in PACU, he was noted to be awake, but confused/combative/uncooperative.  He was given Fentanyl and Haldol without improvement in mental status.  He was then transferred to ICU.  Upon arrival to ICU he remains very agitated and combative.  Dr. Lacinda Axon with Neurosurgery has consulted PCCM for assistance with management of agitation/delirium.  He was placed on Precedex drip.  Workup shows Sodium 129 and glucose 206, all other labs unremarkable.  Urinalysis, serum osmolality, urine osmolality and urine sodium are currently pending.  Pertinent  Medical History  Hydrocephalus Dementia BPH Hyperlipidemia GERD Diabetes mellitus Anxiety Arthritis  Micro Data:  01/04/21: SARS-CoV-2 antigen>>negative 01/08/21: MRSA PCR>>negative  Antimicrobials:  Cefazolin 7/25 x1 dose (surgical prophylaxis)  Significant Hospital Events: Including procedures, antibiotic start and stop dates in addition to other pertinent events   01/08/21: underwent elective right frontal VQ shunt.  Post-op with delirium/agitation requiring Precedex gtt.  PCCM consulted. Found to be Hyponatremic (Na 129) 01/09/21: pt with continued delirium/combativeness still requiring Precedex gtt.  Will consult neurology  01/10/21: Pt with worsening agitation overnight despite scheduled depacon; precedex  gtt; prn fentanyl, and benadryl.  Felt benadryl made delirium worse  7/28 remains on precedex  Interim History / Subjective:  -status post right VP shunt placement,  + severe agitation  Remains on precedex High risk intubation High risk for aspiration   Objective   Blood pressure 132/68, pulse (!) 57, temperature 98.5 F (36.9 C), temperature source Axillary, resp. rate (!) 23, SpO2 97 %.        Intake/Output Summary (Last 24 hours) at 01/11/2021 0719 Last data filed at 01/11/2021 0400 Gross per 24 hour  Intake 949.51 ml  Output 1020 ml  Net -70.49 ml    There were no vitals filed for this visit.   REVIEW OF SYSTEMS  PATIENT IS UNABLE TO PROVIDE COMPLETE REVIEW OF SYSTEMS DUE TO SEVERE CRITICAL ILLNESS AND TOXIC METABOLIC ENCEPHALOPATHY   PHYSICAL EXAMINATION:  GENERAL:critically ill appearing +agitated HEAD: Normocephalic, atraumatic.  Status post right frontal VP shunt placement, dressing clean dry and intact at incision site, neck supple, no JVD EYES: Pupils equal, round, reactive to light.  No scleral icterus.  MOUTH: Moist mucosal membrane. NECK: Supple.  PULMONARY: CTA b/L CARDIOVASCULAR: S1 and S2. Regular rate and rhythm. No murmurs, rubs, or gallops.  GASTROINTESTINAL: Soft, nontender, -distended. Positive bowel sounds.  GU: indwelling foley in place, penile cyanosis resolved  MUSCULOSKELETAL: No swelling, clubbing, or edema.  NEUROLOGIC: delirium SKIN:intact,warm,dry    Assessment & Plan:   Acute Delirium/Agitation, suspect secondary to Anesthesia +   Status post elective Right Frontal VP Shunt insertion ICU delirium and psychosis PMHx of Dementia and Falls  Wean off precedex as tolerated -started on Depacon for mood stabilization.   May consider clonidine patch to wean off Precedex.   Added low-dose risperidone to get patient weaned off of IV medications  with the intent of tapering this off at a later time as well. -Provide supportive care -Promote  normal sleep/wake cycle: continue outpatient melatonin Stop benadryl Follow up Neurology recs  CONTINUE ICU MONITORING   Hyponatremia-resolved   Hydrocephalus s/p Right Frontal VP Shunt -Neurosurgery is primary service, will follow recommendations -Follow serial Neuro exams  Penile cyanosis/pain secondary to paraphimosis s/p reduction of foreskin and glans penis per urology 07/26-resolved  Hx: BPH  ENDO - ICU hypoglycemic\Hyperglycemia protocol -check FSBS per protocol   Best Practice (right click and "Reselect all SmartList Selections" daily)  Diet/type: Clear Liquid Diet Advance As Tolerated once pt can safely tolerate po's  DVT prophylaxis: SCD GI prophylaxis: PPI Lines: N/A Foley:  Yes, and it is still needed Code Status:  full code Last date of multidisciplinary goals of care discussion [07/27]   Labs   CBC: Recent Labs  Lab 01/08/21 1442 01/09/21 0320 01/10/21 0441 01/11/21 0535  WBC 8.4 10.2 8.8 8.6  NEUTROABS  --   --  7.0 6.7  HGB 13.1 14.3 13.8 13.9  HCT 35.8* 39.2 37.9* 39.3  MCV 91.6 91.0 91.5 92.9  PLT 227 283 243 233     Basic Metabolic Panel: Recent Labs  Lab 01/08/21 1442 01/08/21 1851 01/09/21 0320 01/09/21 0730 01/09/21 1600 01/10/21 0441 01/11/21 0535  NA 129*   < > 136 136 136 137 136  K 3.5  --  3.9  --   --  3.3* 3.7  CL 98  --  105  --   --  104 102  CO2 25  --  26  --   --  28 25  GLUCOSE 206*  --  192*  --   --  113* 109*  BUN 14  --  15  --   --  18 18  CREATININE 0.84  --  0.62  --   --  0.60* 0.67  CALCIUM 7.9*  --  8.5*  --   --  8.3* 8.2*  MG  --   --  2.2  --   --  2.1 2.2  PHOS  --   --  3.0  --   --  1.9* 2.9   < > = values in this interval not displayed.    GFR: Estimated Creatinine Clearance: 84.6 mL/min (by C-G formula based on SCr of 0.67 mg/dL). Recent Labs  Lab 01/08/21 1442 01/09/21 0320 01/10/21 0441 01/11/21 0535  WBC 8.4 10.2 8.8 8.6     Liver Function Tests: No results for input(s): AST,  ALT, ALKPHOS, BILITOT, PROT, ALBUMIN in the last 168 hours. No results for input(s): LIPASE, AMYLASE in the last 168 hours. Recent Labs  Lab 01/10/21 0441  AMMONIA 15     ABG No results found for: PHART, PCO2ART, PO2ART, HCO3, TCO2, ACIDBASEDEF, O2SAT   Coagulation Profile: No results for input(s): INR, PROTIME in the last 168 hours.  Cardiac Enzymes: No results for input(s): CKTOTAL, CKMB, CKMBINDEX, TROPONINI in the last 168 hours.  HbA1C: Hgb A1c MFr Bld  Date/Time Value Ref Range Status  01/09/2021 03:20 AM 6.8 (H) 4.8 - 5.6 % Final    Comment:    (NOTE)         Prediabetes: 5.7 - 6.4         Diabetes: >6.4         Glycemic control for adults with diabetes: <7.0     CBG: Recent Labs  Lab 01/10/21 1328 01/10/21 1634 01/10/21 1908 01/10/21 2309 01/11/21 0304  GLUCAP 153* 118* 119* 144* 110*     Past Medical History:  He,  has a past medical history of Anxiety, Arthritis, 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 symptoms, Diabetes ([place]), Fall, GERD (gastroesophageal reflux disease), HLD (hyperlipidemia), Hydrocephalus ([place]), Hypertension, and Unsteady gait.    Allergies No Known Allergies   Home Medications  Prior to Admission medications   Medication Sig Start Date End Date Taking? Authorizing Provider  aspirin EC 81 MG tablet Take 81 mg by mouth daily. Swallow whole.   Yes [provider]  diphenhydrAMINE HCl (ZZZQUIL) 50 MG/30ML LIQD Take 30 mLs by mouth daily as needed (Sleep).   Yes [provider]  finasteride (PROSCAR) 5 MG tablet Take 1 tablet (5 mg total) by mouth daily. Patient taking differently: Take 5 mg by mouth at bedtime. 11/01/16  Yes McGowan, Larene Beach A, PA-C  ibuprofen (ADVIL) 200 MG tablet Take 200 mg by mouth every 6 (six) hours as needed for mild pain or moderate pain.   Yes [provider]  lisinopril-hydrochlorothiazide (ZESTORETIC) 20-25 MG tablet Take 1 tablet by mouth every morning. 12/07/20  Yes [provider]  Melatonin 10 MG CAPS Take 10 mg by mouth at bedtime.   Yes [provider]  Multiple Vitamins-Minerals (CENTRUM SILVER ULTRA WOMENS) TABS Take 1 tablet by mouth daily. 07/06/07  Yes [provider]  omeprazole (PRILOSEC) 20 MG capsule Take 20 mg by mouth every other day. At bedtime 01/30/16  Yes [provider]  tamsulosin (FLOMAX) 0.4 MG CAPS capsule Take 1 capsule (0.4 mg total) by mouth daily. Patient taking differently: Take 0.4 mg by mouth daily after supper. 11/01/16  Yes McGowan, Larene Beach A, PA-C  bismuth subsalicylate (PEPTO BISMOL) 262 MG/15ML suspension Take 30 mLs by mouth every 6 (six) hours as needed.    [provider]       DVT/GI PRX  assessed I Assessed the need for Labs I Assessed the need for Foley I Assessed the need for Central Venous Line Family Discussion when available I Assessed the need for Mobilization I made an Assessment of medications to be adjusted accordingly Safety Risk assessment completed  CASE DISCUSSED IN MULTIDISCIPLINARY ROUNDS WITH ICU TEAM     Critical Care Time devoted to patient care services described in this note is 55 minutes.  Critical care was necessary to treat /prevent imminent and life-threatening deterioration.    Corrin Parker, M.D.  [facility]  Medical Director [place] Director [facility] Cardio-Pulmonary Department
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
## 2021-01-11 NOTE — Evaluation (Signed)
Physical Therapy Evaluation Patient Details Name: Eric [name] MRN: [id number] DOB: 1946/04/22 Today's Date: 01/11/2021   History of Present Illness  Patient is a 75 y.o. male with Hydrocephalus status post elective Right Frontal VP Shunt insertion.  Post-op with acute delirium/agitation felt to be due to complication of anesthesia plus Hyponatremia requiring Precedex drip.  Clinical Impression  Patient sleeping on arrival to room. Patient arouses with voice and is more alert with initiation of mobility. He was oriented x 2 and able to follow single step commands with extra time. No agitation noted during session and restrains removed under therapist supervision.  Patient requires assistance for bed mobility with fair sitting balance. Patient was able to stand and take several steps along edge of bed with +2 person assistance. Blood pressure monitored through session and patient was positive for orthostatic vitals, however does not report symptoms with upright activity (blood pressure supine 128/64, sitting 109/62, standing 95/62). Patient was fatigued after standing for ~ 2 minutes and declined further standing attempts.  Patient was previously independent with mobility at home with shuffled gait per the spouse report. He is currently not at his baseline level of functional mobility and needs continued PT to maximize independence and decrease caregiver burden. Spouse is hopeful that patient can discharge home with HHPT, but discussed the safest plan is SNF at this time.     Follow Up Recommendations SNF (spouse hopeful for d/c home with HHPT)    Equipment Recommendations   (ongoing assessment)    Recommendations for Other Services       Precautions / Restrictions Precautions Precautions: Fall Restrictions Weight Bearing Restrictions: No      Mobility  Bed Mobility Overal bed mobility: Needs Assistance Bed Mobility: Supine to Sit;Sit to Supine     Supine to sit: Mod assist (second  person close by for safety, however physical assistance of +2 person was not required) Sit to supine: Mod assist   General bed mobility comments: assistance for LE and intermittent trunk support provided    Transfers Overall transfer level: Needs assistance Equipment used: None Transfers: Sit to/from Stand Sit to Stand: Min assist;+2 physical assistance         General transfer comment: verbal cues for safety and technique. lifting assistance required for standing. vitals monitored throughout session  Ambulation/Gait Ambulation/Gait assistance: +2 physical assistance;Mod assist Gait Distance (Feet): 1 Feet Assistive device: 2 person hand held assist   Gait velocity: decreased   General Gait Details: patient able to take 2-3 small side steps towards right side. physical assistance for steadying and off loading for advancement of each LE. activity tolerance limited by fatigue.  Stairs            Wheelchair Mobility    Modified Rankin (Stroke Patients Only)       Balance Overall balance assessment: Needs assistance;History of Falls (2 falls reported this year per spouse) Sitting-balance support: Feet supported Sitting balance-Leahy Scale: Fair Sitting balance - Comments: Min A initially for posterior lean progressing to stand by assistance with increased sitting time and cues for postural awareness   Standing balance support: Bilateral upper extremity supported Standing balance-Leahy Scale: Poor Standing balance comment: assistance required to maintain standing balance, cues for upright posture as patient with tendency to flex forward                             Pertinent Vitals/Pain Pain Assessment: No/denies pain    Home  Living Family/patient expects to be discharged to:: Private residence Living Arrangements: Spouse/significant other Available Help at Discharge: Family;Available 24 hours/day Type of Home: House Home Access: Stairs to  enter Entrance Stairs-Rails: Psychiatric nurse of Steps: 4-5   Home Equipment: Cane - single point      Prior Function Level of Independence: Independent with assistive device(s)         Comments: PRN use of cane with ambulation, shuffled gait. independent with mobility otherwise with ADLs, drvies     Hand Dominance   Dominant Hand: Right    Extremity/Trunk Assessment   Upper Extremity Assessment Upper Extremity Assessment: Generalized weakness;Defer to OT evaluation (edema noted RUE, generalized weakness. see OT note for more details)    Lower Extremity Assessment Lower Extremity Assessment: Generalized weakness (patient able to SLR with BLE. standing with support without knee buckling)       Communication   Communication: No difficulties  Cognition Arousal/Alertness: Awake/alert Behavior During Therapy: WFL for tasks assessed/performed Overall Cognitive Status: Impaired/Different from baseline Area of Impairment: Orientation;Following commands;Safety/judgement                 Orientation Level:  (oriented to person, place)     Following Commands: Follows one step commands with increased time Safety/Judgement: Decreased awareness of safety;Decreased awareness of deficits     General Comments: patient sleeping on arrival to room, increased alertness with mobility efforts. patient was not agitated during this session. wrist restraints untied from the bed, mitts removed under supervision. restraints left off at end of session per nurse instruction.      General Comments General comments (skin integrity, edema, etc.): educated patient and spouse about using chair position in bed and importance of upright activity    Exercises     Assessment/Plan    PT Assessment Patient needs continued PT services  PT Problem List Decreased strength;Decreased range of motion;Decreased activity tolerance;Decreased balance;Decreased cognition;Decreased  coordination;Decreased mobility;Decreased safety awareness;Decreased knowledge of use of DME;Decreased knowledge of precautions       PT Treatment Interventions DME instruction;Gait training;Stair training;Functional mobility training;Therapeutic activities;Therapeutic exercise;Balance training;Neuromuscular re-education;Cognitive remediation;Patient/family education    PT Goals (Current goals can be found in the Care Plan section)  Acute Rehab PT Goals Patient Stated Goal: to get up PT Goal Formulation: With patient Time For Goal Achievement: 01/25/21 Potential to Achieve Goals: Good    Frequency 7X/week   Barriers to discharge        Co-evaluation PT/OT/SLP Co-Evaluation/Treatment: Yes Reason for Co-Treatment: Complexity of the patient's impairments (multi-system involvement);For patient/therapist safety (history of agitation and being combative) PT goals addressed during session: Mobility/safety with mobility OT goals addressed during session: ADL's and self-care       AM-PAC PT "6 Clicks" Mobility  Outcome Measure Help needed turning from your back to your side while in a flat bed without using bedrails?: A Little Help needed moving from lying on your back to sitting on the side of a flat bed without using bedrails?: A Lot Help needed moving to and from a bed to a chair (including a wheelchair)?: A Lot Help needed standing up from a chair using your arms (e.g., wheelchair or bedside chair)?: A Lot Help needed to walk in hospital room?: Total Help needed climbing 3-5 steps with a railing? : Total 6 Click Score: 11    End of Session Equipment Utilized During Treatment: Gait belt Activity Tolerance: Patient tolerated treatment well Patient left: in bed;with call bell/phone within reach;with bed alarm set;with family/visitor present (  bed in semi-chair position) Nurse Communication: Mobility status;Precautions PT Visit Diagnosis: Unsteadiness on feet (R26.81);Muscle weakness  (generalized) (M62.81);Other abnormalities of gait and mobility (R26.89)    Time: 1025-1109 PT Time Calculation (min) (ACUTE ONLY): 44 min   Charges:   PT Evaluation $PT Eval Moderate Complexity: 1 Mod PT Treatments $Therapeutic Activity: 8-22 mins        Minna Merritts, PT, MPT  Percell Locus 01/11/2021, 11:36 AM

## 2021-01-11 NOTE — Progress Notes (Addendum)
@   outset of shift, pt on 1.2 of Precedex, Pt raas -1 to -2. Dex weaned to 0.8 this AM. Per Dr. Mortimer Fries, Dex gtt stopped @ 1000.   Pt progressively waking up after dex gtt stopped. Wife @ bedside. Pt states name and intermittently states place/year correctly. Pt worked w/ PT and OT.   Pt failed speech eval today, pt made strict NPO.   Pt's wife left near 1400. Near 1500, pt increasingly confused, anxious, and agitated. Pt found pulling off leads/cords and getting out of bed. PRN Haldol given w/ little effect. Restraints placed back on pt's wrists, Precedex gtt restarted.   Foley in place, putting out clear amber to yellow urine.      Small smear on pad today, pad changed.   Pt had tenacious and thick oral secretions (yellow/tan/white/clear). Oral care provided q2 hours and as needed. Pt suctioned regularly.

## 2021-01-11 NOTE — Progress Notes (Signed)
   Progress Note  History: JAGO BUFFKIN is POD#3 from right frontal VP shunt placement for hydrocephalus. He has dementia at baseline which was exacerbated by anesthesia from his recent surgery. He has remained in the ICU on Precedex and PRNs for agitation. EEG completed yesterday. Therapy services continue to defer treatment due to his cognitive status. Neurology and Intensive Care continue to manage patients delirium.  This morning Mr. Gaydos is more drowsy but able to arouse to name and follow simply commands.   Physical Exam: Vitals:   01/11/21 0600 01/11/21 0700  BP: (!) 137/54 138/67  Pulse: (!) 56 (!) 59  Resp: 17 18  Temp:    SpO2: 96% 97%    AA to person, year, and location (hospital).  Following simple one step commands.  PERRL  Data:  Recent Labs  Lab 01/09/21 0320 01/09/21 0730 01/10/21 0441 01/11/21 0535  NA 136   < > 137 136  K 3.9  --  3.3* 3.7  CL 105  --  104 102  CO2 26  --  28 25  BUN 15  --  18 18  CREATININE 0.62  --  0.60* 0.67  GLUCOSE 192*  --  113* 109*  CALCIUM 8.5*  --  8.3* 8.2*   < > = values in this interval not displayed.    No results for input(s): AST, ALT, ALKPHOS in the last 168 hours.  Invalid input(s): TBILI   Recent Labs  Lab 01/09/21 0320 01/10/21 0441 01/11/21 0535  WBC 10.2 8.8 8.6  HGB 14.3 13.8 13.9  HCT 39.2 37.9* 39.3  PLT 283 243 233    No results for input(s): APTT, INR in the last 168 hours.       Other tests/results: Stunt series completed post-op without noted complication. CT head pending.   Assessment/Plan:  EYOB WORM  is a 75 y.o male s/p right frontal VP shunt. He continues to have post-op delirium requiring Precedex and additional PRNs  - Agree with decision to avoid Benadryl  - continue to attempt to wean sedation  - Wean restraints as tolerated and mobilize when possible  - avoid narcotics when possible  - CT scan shows good catheter placement without post-operative hemorrhage  -  OK for  DVT prophylaxis (On Heparin 5,000 q8) - agree with decision to obtain sitter - Will consult medicine for assistance with delirium and dispo planning - PTOT - defer until pt is more cooperative   Cooper Render PA-C Department of Neurosurgery

## 2021-01-12 ENCOUNTER — Inpatient Hospital Stay: Payer: Medicare Other

## 2021-01-12 DIAGNOSIS — R410 Disorientation, unspecified: Secondary | ICD-10-CM | POA: Diagnosis not present

## 2021-01-12 DIAGNOSIS — G910 Communicating hydrocephalus: Secondary | ICD-10-CM

## 2021-01-12 DIAGNOSIS — Z515 Encounter for palliative care: Secondary | ICD-10-CM

## 2021-01-12 DIAGNOSIS — Z7189 Other specified counseling: Secondary | ICD-10-CM | POA: Diagnosis not present

## 2021-01-12 DIAGNOSIS — E878 Other disorders of electrolyte and fluid balance, not elsewhere classified: Secondary | ICD-10-CM

## 2021-01-12 LAB — CBC WITH DIFFERENTIAL/PLATELET
Abs Immature Granulocytes: 0.02 10*3/uL (ref 0.00–0.07)
Basophils Absolute: 0 10*3/uL (ref 0.0–0.1)
Basophils Relative: 0 %
Eosinophils Absolute: 0.1 10*3/uL (ref 0.0–0.5)
Eosinophils Relative: 1 %
HCT: 39.3 % (ref 39.0–52.0)
Hemoglobin: 14.6 g/dL (ref 13.0–17.0)
Immature Granulocytes: 0 %
Lymphocytes Relative: 16 %
Lymphs Abs: 1.2 10*3/uL (ref 0.7–4.0)
MCH: 33.8 pg (ref 26.0–34.0)
MCHC: 37.2 g/dL — ABNORMAL HIGH (ref 30.0–36.0)
MCV: 91 fL (ref 80.0–100.0)
Monocytes Absolute: 0.5 10*3/uL (ref 0.1–1.0)
Monocytes Relative: 7 %
Neutro Abs: 5.7 10*3/uL (ref 1.7–7.7)
Neutrophils Relative %: 76 %
Platelets: 245 10*3/uL (ref 150–400)
RBC: 4.32 MIL/uL (ref 4.22–5.81)
RDW: 11.3 % — ABNORMAL LOW (ref 11.5–15.5)
WBC: 7.5 10*3/uL (ref 4.0–10.5)
nRBC: 0 % (ref 0.0–0.2)

## 2021-01-12 LAB — COMPREHENSIVE METABOLIC PANEL
ALT: 23 U/L (ref 0–44)
AST: 16 U/L (ref 15–41)
Albumin: 2.9 g/dL — ABNORMAL LOW (ref 3.5–5.0)
Alkaline Phosphatase: 45 U/L (ref 38–126)
Anion gap: 5 (ref 5–15)
BUN: 23 mg/dL (ref 8–23)
CO2: 27 mmol/L (ref 22–32)
Calcium: 8.2 mg/dL — ABNORMAL LOW (ref 8.9–10.3)
Chloride: 105 mmol/L (ref 98–111)
Creatinine, Ser: 0.62 mg/dL (ref 0.61–1.24)
GFR, Estimated: >60 mL/min (ref >60)
Glucose, Bld: 129 mg/dL — ABNORMAL HIGH (ref 70–99)
Potassium: 4.1 mmol/L (ref 3.5–5.1)
Sodium: 137 mmol/L (ref 135–145)
Total Bilirubin: 1.2 mg/dL (ref 0.3–1.2)
Total Protein: 5.8 g/dL — ABNORMAL LOW (ref 6.5–8.1)

## 2021-01-12 LAB — GLUCOSE, CAPILLARY
Glucose-Capillary: 112 mg/dL — ABNORMAL HIGH (ref 70–99)
Glucose-Capillary: 119 mg/dL — ABNORMAL HIGH (ref 70–99)
Glucose-Capillary: 130 mg/dL — ABNORMAL HIGH (ref 70–99)
Glucose-Capillary: 146 mg/dL — ABNORMAL HIGH (ref 70–99)
Glucose-Capillary: 110 mg/dL — ABNORMAL HIGH (ref 70–99)
Glucose-Capillary: 117 mg/dL — ABNORMAL HIGH (ref 70–99)

## 2021-01-12 LAB — VALPROIC ACID LEVEL: Valproic Acid Lvl: 61 ug/mL (ref 50.0–100.0)

## 2021-01-12 LAB — VITAMIN B1: Vitamin B1 (Thiamine): 149.9 nmol/L (ref 66.5–200.0)

## 2021-01-12 LAB — AMMONIA: Ammonia: 18 umol/L (ref 9–35)

## 2021-01-12 MED ORDER — SODIUM CHLORIDE 0.9 % IV SOLN
INTRAVENOUS | Status: DC | PRN
  Administered 2021-01-12 – 2021-01-15 (×3): 250 mL via INTRAVENOUS

## 2021-01-12 MED ORDER — SODIUM BICARBONATE 650 MG PO TABS
650.0000 mg | ORAL_TABLET | Freq: Once | ORAL | Status: DC
  Filled 2021-01-12: qty 1

## 2021-01-12 MED ORDER — PANTOPRAZOLE SODIUM 40 MG IV SOLR
40.0000 mg | Freq: Every day | INTRAVENOUS | Status: DC
  Administered 2021-01-12 – 2021-01-21 (×10): 40 mg via INTRAVENOUS
  Filled 2021-01-12 (×11): qty 40

## 2021-01-12 MED ORDER — SODIUM BICARBONATE 650 MG PO TABS
650.0000 mg | ORAL_TABLET | Freq: Once | ORAL | Status: AC
  Administered 2021-01-12: 650 mg
  Filled 2021-01-12: qty 1

## 2021-01-12 MED ORDER — QUETIAPINE FUMARATE 25 MG PO TABS
50.0000 mg | ORAL_TABLET | Freq: Every day | ORAL | Status: DC
  Administered 2021-01-12 – 2021-01-13 (×2): 50 mg
  Filled 2021-01-12 (×2): qty 2

## 2021-01-12 MED ORDER — RISPERIDONE 1 MG PO TBDP
0.5000 mg | ORAL_TABLET | Freq: Every day | ORAL | Status: DC
  Filled 2021-01-12: qty 0.5

## 2021-01-12 MED ORDER — VITAMIN B-12 1000 MCG PO TABS: 1000.0000 ug | ORAL_TABLET | Freq: Every day | ORAL | Status: DC

## 2021-01-12 MED ORDER — CYANOCOBALAMIN 1000 MCG/ML IJ SOLN
1000.0000 ug | Freq: Every day | INTRAMUSCULAR | Status: AC
  Administered 2021-01-12 – 2021-01-15 (×4): 1000 ug via INTRAMUSCULAR
  Filled 2021-01-12 (×4): qty 1

## 2021-01-12 MED ORDER — PANCRELIPASE (LIP-PROT-AMYL) 10440-39150 UNITS PO TABS
20880.0000 [IU] | ORAL_TABLET | Freq: Once | ORAL | Status: AC
  Administered 2021-01-12: 20880 [IU]
  Filled 2021-01-12: qty 2

## 2021-01-12 MED ORDER — PANCRELIPASE (LIP-PROT-AMYL) 10440-39150 UNITS PO TABS
20880.0000 [IU] | ORAL_TABLET | Freq: Once | ORAL | Status: DC
  Filled 2021-01-12: qty 2

## 2021-01-12 MED ORDER — CLONIDINE HCL 0.2 MG/24HR TD PTWK: 0.2000 mg | MEDICATED_PATCH | TRANSDERMAL | Status: DC

## 2021-01-12 NOTE — Progress Notes (Signed)
Updated pts wife at bedside regarding pt condition and current plan of care.  All questions were answered and Mrs. Sabori was appreciative of the update.  Will continue to monitor and assess pt.  Rosilyn Mings, AGNP  Pulmonary/Critical Care Pager [phone] (please enter 7 digits) PCCM Consult Pager [phone] (please enter 7 digits)

## 2021-01-12 NOTE — Consult Note (Signed)
Consultation Note Date: 01/12/2021   Patient Name: Eric [name]  DOB: [birth year]  MRN: [id number]  Age / Sex: 75 y.o., male  PCP: Ileana Roup, MD Referring Physician: Deetta Perla, MD  Reason for Consultation: Establishing goals of care  HPI/Patient Profile:  75 year old male here with normal pressure hydrocephalus s/p VP shunt placement, course was complicated with postop acute hyperactive delirium. Clinical Assessment and Goals of Care: Patient is resting in bed. He is delirious and is in restraints. No family at bedside.   Spoke with wife. She states a year ago patient was doing well, and enjoyed gardening and yard work. She states he had a fall, and she noticed decline after this. She states he became somewhat forgetful, and began to have problems walking. She states he has had a brain mass since 2004, but developed fluid that was thought to be the cause of his difficulty with walking, and a shunt was placed to help this.   We discussed his diagnoses, prognosis, GOC, EOL wishes disposition and options.  Created space and opportunity for patient  to explore thoughts and feelings regarding current medical information.   A detailed discussion was had today regarding advanced directives.  Concepts specific to code status, artifical feeding and hydration, IV antibiotics and rehospitalization were discussed.  The difference between an aggressive medical intervention path and a comfort care path was discussed.  Values and goals of care important to patient and family were attempted to be elicited.  Discussed limitations of medical interventions to prolong quality of life in some situations and discussed the concept of human mortality.  She states this delirium has happened before when he was hospitalized and he is hopeful he will improve to go home. At this time she would want all  care possible.          SUMMARY OF RECOMMENDATIONS   Would recommend initiating Risperdal for delirium.   Starting dose is 0.'5mg'$  BID. If this is tolerated, 24 hours later can increase to '1mg'$  BID. If symptoms are not controlled, the third day can either consider increasing frequency to '1mg'$  TID, or increase dose to '2mg'$  BID for agitation and delirium.  Alternatively, you could also try Zyprexa 2.'5mg'$  PO.   Full code/full scope at this time.           Primary Diagnoses: Present on Admission:  Hydrocephalus ([place])   I have reviewed the medical record, interviewed the patient and family, and examined the patient. The following aspects are pertinent.  Past Medical History:  Diagnosis Date   Anxiety    Arthritis    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 symptoms    Diabetes (HCC)    no meds-last a1c 5.7   Fall    GERD (gastroesophageal reflux disease)    HLD (hyperlipidemia)    Hydrocephalus (HCC)    Hypertension    Unsteady gait    Social History   Socioeconomic History   Marital status: Married    Spouse name: Not on file   Number of children:  Not on file   Years of education: Not on file   Highest education level: Not on file  Occupational History   Not on file  Tobacco Use   Smoking status: Former    Packs/day: 2.00    Years: 42.00    Pack years: 84.00    Types: Cigarettes    Quit date: 2007    Years since quitting: 15.5   Smokeless tobacco: Never   Tobacco comments:    quit 10 years   Vaping Use   Vaping Use: Never used  Substance and Sexual Activity   Alcohol use: No   Drug use: No   Sexual activity: Not on file  Other Topics Concern   Not on file  Social History Narrative   Not on file   Social Determinants of Health   Financial Resource Strain: Not on file  Food Insecurity: Not on file  Transportation Needs: Not on file  Physical Activity: Not on file  Stress: Not on file  Social Connections: Not on file   Family History  Problem Relation Age of Onset    Prostate cancer Neg Hx    Kidney cancer Neg Hx    Bladder Cancer Neg Hx    Scheduled Meds:  Chlorhexidine Gluconate Cloth  6 each Topical Daily   [START ON 01/17/2021] cloNIDine  0.2 mg Transdermal Weekly   cyanocobalamin  1,000 mcg Intramuscular Daily   Followed by   Derrill Memo ON 01/16/2021] vitamin B-12  1,000 mcg Oral Daily   finasteride  5 mg Oral Nightly   heparin injection (subcutaneous)  5,000 Units Subcutaneous Q8H   lisinopril  20 mg Oral Daily   And   hydrochlorothiazide  25 mg Oral Daily   insulin aspart  0-9 Units Subcutaneous Q4H   melatonin  10 mg Oral Nightly   pantoprazole (PROTONIX) IV  40 mg Intravenous Daily   senna  1 tablet Oral BID   tamsulosin  0.4 mg Oral QPC supper   thiamine injection  100 mg Intravenous Daily   Continuous Infusions:  dexmedetomidine (PRECEDEX) IV infusion Stopped (01/12/21 0829)   valproate sodium     valproate sodium 500 mg (01/12/21 1459)   PRN Meds:.acetaminophen **OR** acetaminophen, bisacodyl, bismuth subsalicylate, fentaNYL (SUBLIMAZE) injection, haloperidol lactate, labetalol, ondansetron **OR** ondansetron (ZOFRAN) IV, promethazine, valproate sodium Medications Prior to Admission:  Prior to Admission medications   Medication Sig Start Date End Date Taking? Authorizing Provider  aspirin EC 81 MG tablet Take 81 mg by mouth daily. Swallow whole.   Yes [provider]  diphenhydrAMINE HCl (ZZZQUIL) 50 MG/30ML LIQD Take 30 mLs by mouth daily as needed (Sleep).   Yes [provider]  finasteride (PROSCAR) 5 MG tablet Take 1 tablet (5 mg total) by mouth daily. Patient taking differently: Take 5 mg by mouth at bedtime. 11/01/16  Yes McGowan, Larene Beach A, PA-C  ibuprofen (ADVIL) 200 MG tablet Take 200 mg by mouth every 6 (six) hours as needed for mild pain or moderate pain.   Yes [provider]  lisinopril-hydrochlorothiazide (ZESTORETIC) 20-25 MG tablet Take 1 tablet by mouth every morning. 12/07/20  Yes [provider]  Melatonin 10 MG CAPS Take 10 mg by mouth at bedtime.   Yes [provider]  Multiple Vitamins-Minerals (CENTRUM SILVER ULTRA WOMENS) TABS Take 1 tablet by mouth daily. 07/06/07  Yes [provider]  omeprazole (PRILOSEC) 20 MG capsule Take 20 mg by mouth every other day. At bedtime 01/30/16  Yes [provider]  tamsulosin (FLOMAX) 0.4 MG CAPS capsule Take 1 capsule (0.4 mg total) by mouth daily. Patient taking differently: Take 0.4 mg by mouth daily after supper. 11/01/16  Yes McGowan, Larene Beach A, PA-C  bismuth subsalicylate (PEPTO BISMOL) 262 MG/15ML suspension Take 30 mLs by mouth every 6 (six) hours as needed.    [provider]   No Known Allergies Review of Systems  Unable to perform ROS  Physical Exam Pulmonary:     Effort: Pulmonary effort is normal.  Neurological:     Mental Status: He is alert. He is disoriented.    Vital Signs: BP 104/71   Pulse 76   Temp (!) 97.5 F (36.4 C) (Axillary)   Resp (!) 24   SpO2 94%  Pain Scale: CPOT   Pain Score: 0-No pain   SpO2: SpO2: 94 % O2 Device:SpO2: 94 % O2 Flow Rate: .O2 Flow Rate (L/min): 2 L/min  IO: Intake/output summary:  Intake/Output Summary (Last 24 hours) at 01/12/2021 1526 Last data filed at 01/12/2021 1100 Gross per 24 hour  Intake 917.26 ml  Output 1075 ml  Net -157.74 ml    LBM: Last BM Date:  (Smear on 7/28) Baseline Weight:   Most recent weight:         Time In: 3:15 Time Out: 4:00 Time Total: 45 min Greater than 50%  of this time was spent counseling and coordinating care related to the above assessment and plan.  Signed by: Asencion Gowda, NP   Please contact Palliative Medicine Team phone at [phone] for questions and concerns.  For individual provider: See Shea Evans

## 2021-01-12 NOTE — Progress Notes (Signed)
OT Cancellation Note  Patient Details Name: Eric [name] MRN: [id number] DOB: 03-29-1946   Cancelled Treatment:    Reason Eval/Treat Not Completed: Fatigue/lethargy limiting ability to participate Pt still relatively drowsy this AM, RN reporting he received precedex gtt over night and has been stopped this morning. Will f/u for tx at later date/time as able/appropriate. Thank you.  Gerrianne Scale, [place], OTR/L ascom [phone] 01/12/21, 11:56 AM

## 2021-01-12 NOTE — Progress Notes (Signed)
Precedex gtt stopped @ outset of shift. Pt remains intermittently agitated/combative/anxious/confused. Dob hof tube placed, per Hinton Dyer NP okay to use. Pt up and out of bed w/ PT today, pt appeared to be lucid and following commands. Shortly after returning to bed, pt began pulling at lines and NG tube. Restraints and mitts remain ON. Pt in no acute distress @ this time. Will continue to monitor.

## 2021-01-12 NOTE — Progress Notes (Signed)
   01/12/21 1300  Clinical Encounter Type  Visited With Family  Visit Type Follow-up  Referral From Chaplain  Consult/Referral To [place] saw PT's wife in the cafeteria area and engaged with her.  PT's wife was little teary-eyed and disappointed that he had not been discharged yet. Chaplain supported and normalized her emotions. Chaplain also explored coping mechanisms and self-care. Chaplain offered words of prayer for peace and comfort.

## 2021-01-12 NOTE — Progress Notes (Signed)
SLP Cancellation Note  Patient Details Name: Eric [name] MRN: [id number] DOB: 1946-01-07   Cancelled treatment:       Reason Eval/Treat Not Completed: Medical issues which prohibited therapy   Pt was awake/alert and attempting to take off his mittens while in bilateral restraints. Despite attempts to redirect pt, he was able to get one mitten off and threw it across the room. Pt is audibly wet when breathing and he is verbally repetitive sounds that are incoherent. Pt unsafe for PO intake at this time. Nursing aware.   Eric [name] B. Rutherford Nail M.S., CCC-SLP, [place] Office [phone]   Eric [name] Rutherford Nail 01/12/2021, 2:28 PM

## 2021-01-12 NOTE — Progress Notes (Signed)
Physical Therapy Treatment Patient Details Name: Eric [name] MRN: [id number] DOB: [birth year] Today's Date: 01/12/2021    History of Present Illness Patient is a 75 y.o. male with Hydrocephalus status post elective Right Frontal VP Shunt insertion.  Post-op with acute delirium/agitation felt to be due to complication of anesthesia plus Hyponatremia requiring Precedex drip.    PT Comments    Pt was long sitting in bed with MITTs applied and wrist restraints applied upon arriving. Pt also had sitter present. Sitter assisted throughout session with management of line/lead/monitors. Vitals were stable throughout session on rm air. He is confused about situation but was somewhat oriented. Eager to get OOB. Was able to exit R side of bed, stand to RW, and ambulate to doorway of room and return. Pt has poor gait posture throughout with constant assistance and cues. Poor motor planning when pt approached bed to return to supine.  Overall he tolerated session well and has great rehab potential. Acute PT will continue to follow and progress as able per current POC.    Follow Up Recommendations  SNF;Other (comment) (May be  CIR appropriate. Will change recs if pt can tolerate increased amount of therapy daily.)     Equipment Recommendations  Rolling walker with 5" wheels       Precautions / Restrictions Precautions Precautions: Fall Restrictions Weight Bearing Restrictions: No    Mobility  Bed Mobility Overal bed mobility: Needs Assistance Bed Mobility: Supine to Sit;Sit to Supine     Supine to sit: Mod assist Sit to supine: Mod assist        Transfers Overall transfer level: Needs assistance Equipment used: Rolling walker (2 wheeled) Transfers: Sit to/from Stand Sit to Stand: +2 safety/equipment;Mod assist         General transfer comment: Pt performed standing from EOB 2 x with mod assist of one + 2nd person for safety to manage line/tubes/monitors. Pt is impusive and needs  constant vcs for posture correction, mantaining grip on RW, and staying inside.  Ambulation/Gait Ambulation/Gait assistance: Mod assist;+2 safety/equipment Gait Distance (Feet): 20 Feet Assistive device: Rolling walker (2 wheeled) Gait Pattern/deviations: Step-to pattern;Trunk flexed Gait velocity: decreased   General Gait Details: Pt ambulated to doorweay of rooma nd back to bed with +2 assistance. mostly to manage line/leads/and monitors.      Balance Overall balance assessment: Needs assistance;History of Falls Sitting-balance support: Feet supported Sitting balance-Leahy Scale: Fair     Standing balance support: Bilateral upper extremity supported Standing balance-Leahy Scale: Poor    Cognition Arousal/Alertness: Awake/alert Behavior During Therapy: WFL for tasks assessed/performed Overall Cognitive Status: Impaired/Different from baseline        Orientation Level: Disoriented to;Situation     Following Commands: Follows one step commands consistently;Follows one step commands with increased time Safety/Judgement: Decreased awareness of safety;Decreased awareness of deficits     General Comments: Pt was alert and conversational however confused about situation and unaware of overall safety concerns/deficits             Pertinent Vitals/Pain Pain Assessment: No/denies pain     PT Goals (current goals can now be found in the care plan section) Acute Rehab PT Goals Patient Stated Goal: none stated Progress towards PT goals: Progressing toward goals    Frequency    7X/week      PT Plan Current plan remains appropriate    Co-evaluation     PT goals addressed during session: Mobility/safety with mobility;Balance;Proper use of DME;Strengthening/ROM  AM-PAC PT "6 Clicks" Mobility   Outcome Measure  Help needed turning from your back to your side while in a flat bed without using bedrails?: A Little Help needed moving from lying on your back to  sitting on the side of a flat bed without using bedrails?: A Lot Help needed moving to and from a bed to a chair (including a wheelchair)?: A Lot Help needed standing up from a chair using your arms (e.g., wheelchair or bedside chair)?: A Lot Help needed to walk in hospital room?: A Lot Help needed climbing 3-5 steps with a railing? : A Lot 6 Click Score: 13    End of Session Equipment Utilized During Treatment: Gait belt Activity Tolerance: Patient tolerated treatment well Patient left: in bed;with call bell/phone within reach;with bed alarm set;with family/visitor present Nurse Communication: Mobility status;Precautions PT Visit Diagnosis: Unsteadiness on feet (R26.81);Muscle weakness (generalized) (M62.81);Other abnormalities of gait and mobility (R26.89)     Time: 1515-1540 PT Time Calculation (min) (ACUTE ONLY): 25 min  Charges:  $Gait Training: 8-22 mins $Therapeutic Activity: 8-22 mins                     Julaine Fusi PTA 01/12/21, 5:02 PM

## 2021-01-12 NOTE — Progress Notes (Signed)
   Progress Note  History: Eric [name] is POD#4 from right frontal VP shunt placement for hydrocephalus. He has dementia at baseline which was exacerbated by anesthesia from his recent surgery. He has remained in the ICU intermittently on Precedex and PRNs for agitation.  Speech saw the patient yesterday and recommended continued n.p.o. therapy services have seen the patient and had him stand with assistance.  Neurology and Intensive Care continue to manage patients delirium.  This morning Eric [name] continues to be drowsy on sedation but is largely neurologically unchanged  Physical Exam: Vitals:   01/12/21 0700 01/12/21 0800  BP: (!) 156/69 (!) 148/73  Pulse: (!) 48 (!) 47  Resp: 15 14  Temp:    SpO2: 96% 97%    AA to person, year, and location (hospital).  Following simple one step commands.  PERRL Incisions c/d/I   Data:  Recent Labs  Lab 01/09/21 0320 01/09/21 0730 01/10/21 0441 01/11/21 0535  NA 136   < > 137 136  K 3.9  --  3.3* 3.7  CL 105  --  104 102  CO2 26  --  28 25  BUN 15  --  18 18  CREATININE 0.62  --  0.60* 0.67  GLUCOSE 192*  --  113* 109*  CALCIUM 8.5*  --  8.3* 8.2*   < > = values in this interval not displayed.    No results for input(s): AST, ALT, ALKPHOS in the last 168 hours.  Invalid input(s): TBILI   Recent Labs  Lab 01/09/21 0320 01/10/21 0441 01/11/21 0535  WBC 10.2 8.8 8.6  HGB 14.3 13.8 13.9  HCT 39.2 37.9* 39.3  PLT 283 243 233    No results for input(s): APTT, INR in the last 168 hours.       Other tests/results: Stunt series completed post-op without noted complication. CT head pending.   Assessment/Plan:  Eric [name]  is a 75 y.o male s/p right frontal VP shunt. He continues to have post-op delirium requiring continued intermittent sedation.  - Agree with decision to avoid Benadryl  - continue to attempt to wean sedation  - Wean restraints as tolerated and mobilize when possible  - avoid narcotics when possible   - CT scan shows good catheter placement without post-operative hemorrhage  -  OK for DVT prophylaxis (On Heparin 5,000 q8) - PTOT  - Appreciate intensive care and neurology assistance.   Cooper Render PA-C Department of Neurosurgery

## 2021-01-12 NOTE — Progress Notes (Signed)
NAME:  Eric [name], MRN:  [id number], DOB:  [birth year], LOS: 4 ADMISSION DATE:  01/08/2021, CONSULTATION DATE:  01/08/2021 REFERRING MD:  Dr. Lacinda Axon, CHIEF COMPLAINT:  Agitation/Delirum   Brief Pt Description / Synopsis:  75 y.o. male with Hydrocephalus status post elective Right Frontal VP Shunt insertion.  Post-op with acute delirium/agitation felt to be due to complication of anesthesia plus Hyponatremia (Na+ 129) requiring Precedex drip.  History of Present Illness:  Eric [name] is a 75 y.o. Male with a past medical history as listed below, who presented for elective Right frontal VP shunt insertion on 01/08/21 due to Hydrocephalus.  Post-op in PACU, he was noted to be awake, but confused/combative/uncooperative.  He was given Fentanyl and Haldol without improvement in mental status.  He was then transferred to ICU.  Upon arrival to ICU he remains very agitated and combative.  Dr. Lacinda Axon with Neurosurgery has consulted PCCM for assistance with management of agitation/delirium.  He was placed on Precedex drip.  Workup shows Sodium 129 and glucose 206, all other labs unremarkable.  Urinalysis, serum osmolality, urine osmolality and urine sodium are currently pending.  Pertinent  Medical History  Hydrocephalus Dementia BPH Hyperlipidemia GERD Diabetes mellitus Anxiety Arthritis  Micro Data:  01/04/21: SARS-CoV-2 antigen>>negative 01/08/21: MRSA PCR>>negative  Antimicrobials:  Cefazolin 7/25 x1 dose (surgical prophylaxis)  Significant Hospital Events: Including procedures, antibiotic start and stop dates in addition to other pertinent events   01/08/21: underwent elective right frontal VQ shunt.  Post-op with delirium/agitation requiring Precedex gtt.  PCCM consulted. Found to be Hyponatremic (Na 129) 01/09/21: pt with continued delirium/combativeness still requiring Precedex gtt.  Will consult neurology  01/10/21: Pt with worsening agitation overnight despite scheduled depacon; precedex  gtt; prn fentanyl, and benadryl.  Felt benadryl made delirium worse   Interim History / Subjective:  -Patient arrived to ICU status post right VP shunt placement, with severe agitation felt to be secondary to anesthesia with history of dementia -PCCM consulted for assistance with management of delirium -Precedex gtt weaned off on 07/28, however pt developed worsening agitation/delirium at 1700 therefore precedex gtt restarted.   -07/29 pt currently sedated precedex gtt stopped at this time   Objective   Blood pressure (!) 148/73, pulse (!) 47, temperature (P) 98.4 F (36.9 C), resp. rate 14, SpO2 97 %.        Intake/Output Summary (Last 24 hours) at 01/12/2021 0901 Last data filed at 01/12/2021 0540 Gross per 24 hour  Intake 917.26 ml  Output 1000 ml  Net -82.74 ml   There were no vitals filed for this visit.  Examination: General: Acutely ill appearing elderly male, NAD, with intermittent agitation/combativeness  HENT: Status post right frontal VP shunt placement, dressing clean dry and intact at incision site, neck supple, no JVD Lungs: rhonchi throughout, even, nonlabored Cardiovascular: sinus bradycardia, S1-S2, no murmurs, rubs, gallops, 2+ radial/2+ distal pulses  Abdomen: Rounded, soft, nondistended, no guarding rebound tenderness, bowel sounds positive x4 Extremities: Normal bulk and tone, no deformities, no edema Neuro: sedated, intermittent agitation/combativeness, PERRL GU: indwelling foley in place, penile cyanosis resolved   Resolved Hospital Problem list   Hyponatremia  Hypophosphatemia Paraphimosis  Assessment & Plan:   Acute Delirium/Agitation, suspect secondary to Anesthesia +   Status post elective Right Frontal VP Shunt insertion 07/25 Hyponatremia PMHx of Dementia and Falls  -Provide supportive care -Promote normal sleep/wake cycle: continue outpatient melatonin -Wean precedex gtt as tolerated  -Neurology consulted appreciate input  -TSH~2.709 07/26;  thyroxine (T4) 4.5/free thyroxine  index (1.1)/T3 uptake ratio 24 07/26 -Continue scheduled depacon/risperdal/thiamine and prn haldol/fentanyl for agiation/delirium  -Will increase clonidine patch to 0.2 mg to wean precedex gtt off  -Continue sitter at bedside for pt safety   HTN Hx: HLD -Continuous telemetry monitoring -Continue outpatient po antihypertensives once deemed appropriate to tolerate po's by speech therapy   Hyponatremia-resolved  Hypophosphatemia-resolved  -Monitor I&O's / urinary output -Follow BMP -Ensure adequate renal perfusion -Avoid nephrotoxic agents as able -Replace electrolytes as indicated -Continue outpatient flomax and finasteride  -Serum osmolality 283/urine osmolality 586/urine sodium 152   Hydrocephalus s/p Right Frontal VP Shunt~CT Head 07/27 revealed right superior approach CSF shunt placed with no adverse features. Ventricular size and configuration not significantly changed since May. Stable midline tectal lipoma.  No new intracranial abnormality -Neurosurgery is primary service, will follow recommendations -Follow Neuro exams  Type II diabetes mellitus  -CBG's q4hrs  -SSI   Best Practice (right click and "Reselect all SmartList Selections" daily)  Diet/type: NPO; speech therapy consulted to assess swallowing and possibly starting diet when appropriate  DVT prophylaxis: SCD GI prophylaxis: PPI Lines: N/A Foley:  Yes; will possibly discontinue today  Code Status:  full code Last date of multidisciplinary goals of care discussion [07/28]  Labs   CBC: Recent Labs  Lab 01/08/21 1442 01/09/21 0320 01/10/21 0441 01/11/21 0535  WBC 8.4 10.2 8.8 8.6  NEUTROABS  --   --  7.0 6.7  HGB 13.1 14.3 13.8 13.9  HCT 35.8* 39.2 37.9* 39.3  MCV 91.6 91.0 91.5 92.9  PLT 227 283 243 0000000    Basic Metabolic Panel: Recent Labs  Lab 01/08/21 1442 01/08/21 1851 01/09/21 0320 01/09/21 0730 01/09/21 1600 01/10/21 0441 01/11/21 0535  NA 129*   < > 136  136 136 137 136  K 3.5  --  3.9  --   --  3.3* 3.7  CL 98  --  105  --   --  104 102  CO2 25  --  26  --   --  28 25  GLUCOSE 206*  --  192*  --   --  113* 109*  BUN 14  --  15  --   --  18 18  CREATININE 0.84  --  0.62  --   --  0.60* 0.67  CALCIUM 7.9*  --  8.5*  --   --  8.3* 8.2*  MG  --   --  2.2  --   --  2.1 2.2  PHOS  --   --  3.0  --   --  1.9* 2.9   < > = values in this interval not displayed.   GFR: Estimated Creatinine Clearance: 84.6 mL/min (by C-G formula based on SCr of 0.67 mg/dL). Recent Labs  Lab 01/08/21 1442 01/09/21 0320 01/10/21 0441 01/11/21 0535  WBC 8.4 10.2 8.8 8.6    Liver Function Tests: No results for input(s): AST, ALT, ALKPHOS, BILITOT, PROT, ALBUMIN in the last 168 hours. No results for input(s): LIPASE, AMYLASE in the last 168 hours. Recent Labs  Lab 01/10/21 0441  AMMONIA 15    ABG No results found for: PHART, PCO2ART, PO2ART, HCO3, TCO2, ACIDBASEDEF, O2SAT   Coagulation Profile: No results for input(s): INR, PROTIME in the last 168 hours.  Cardiac Enzymes: No results for input(s): CKTOTAL, CKMB, CKMBINDEX, TROPONINI in the last 168 hours.  HbA1C: Hgb A1c MFr Bld  Date/Time Value Ref Range Status  01/09/2021 03:20 AM 6.8 (H) 4.8 - 5.6 %  Final    Comment:    (NOTE)         Prediabetes: 5.7 - 6.4         Diabetes: >6.4         Glycemic control for adults with diabetes: <7.0     CBG: Recent Labs  Lab 01/11/21 1455 01/11/21 1929 01/11/21 2320 01/12/21 0311 01/12/21 0725  GLUCAP 119* 128* 95 112* 119*    Review of Systems: Positives in BOLD   Unable to assess pt sedated and confused  Past Medical History:  He,  has a past medical history of Anxiety, Arthritis, 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 symptoms, Diabetes ([place]), Fall, GERD (gastroesophageal reflux disease), HLD (hyperlipidemia), Hydrocephalus ([place]), Hypertension, and Unsteady gait.   Surgical History:   Past Surgical History:  Procedure Laterality  Date   arm surgery Left    fracture   COLONOSCOPY     TONSILLECTOMY  1962   VENTRICULOPERITONEAL SHUNT Right 01/08/2021   Procedure: RIGHT FRONTAL VENTRICULOPERITONEAL SHUNT INSERTION;  Surgeon: Deetta Perla, MD;  Location: [facility] ORS;  Service: Neurosurgery;  Laterality: Right;     Social History:   reports that he quit smoking about 15 years ago. His smoking use included cigarettes. He has a 84.00 pack-year smoking history. He has never used smokeless tobacco. He reports that he does not drink alcohol and does not use drugs.   Family History:  His family history is negative for Prostate cancer, Kidney cancer, and Bladder Cancer.   Allergies No Known Allergies   Home Medications  Prior to Admission medications   Medication Sig Start Date End Date Taking? Authorizing Provider  aspirin EC 81 MG tablet Take 81 mg by mouth daily. Swallow whole.   Yes [provider]  diphenhydrAMINE HCl (ZZZQUIL) 50 MG/30ML LIQD Take 30 mLs by mouth daily as needed (Sleep).   Yes [provider]  finasteride (PROSCAR) 5 MG tablet Take 1 tablet (5 mg total) by mouth daily. Patient taking differently: Take 5 mg by mouth at bedtime. 11/01/16  Yes McGowan, Larene Beach A, PA-C  ibuprofen (ADVIL) 200 MG tablet Take 200 mg by mouth every 6 (six) hours as needed for mild pain or moderate pain.   Yes [provider]  lisinopril-hydrochlorothiazide (ZESTORETIC) 20-25 MG tablet Take 1 tablet by mouth every morning. 12/07/20  Yes [provider]  Melatonin 10 MG CAPS Take 10 mg by mouth at bedtime.   Yes [provider]  Multiple Vitamins-Minerals (CENTRUM SILVER ULTRA WOMENS) TABS Take 1 tablet by mouth daily. 07/06/07  Yes [provider]  omeprazole (PRILOSEC) 20 MG capsule Take 20 mg by mouth every other day. At bedtime 01/30/16  Yes [provider]  tamsulosin (FLOMAX) 0.4 MG CAPS capsule Take 1 capsule (0.4 mg total) by mouth daily. Patient taking differently:  Take 0.4 mg by mouth daily after supper. 11/01/16  Yes McGowan, Larene Beach A, PA-C  bismuth subsalicylate (PEPTO BISMOL) 262 MG/15ML suspension Take 30 mLs by mouth every 6 (six) hours as needed.    [provider]     Critical care time: 35 minutes     Rosilyn Mings, [place] Pager [phone] (please enter 7 digits) PCCM Consult Pager [phone] (please enter 7 digits)

## 2021-01-13 ENCOUNTER — Inpatient Hospital Stay: Payer: Medicare Other

## 2021-01-13 DIAGNOSIS — N400 Enlarged prostate without lower urinary tract symptoms: Secondary | ICD-10-CM | POA: Diagnosis present

## 2021-01-13 DIAGNOSIS — R410 Disorientation, unspecified: Secondary | ICD-10-CM | POA: Diagnosis not present

## 2021-01-13 DIAGNOSIS — E119 Type 2 diabetes mellitus without complications: Secondary | ICD-10-CM | POA: Diagnosis not present

## 2021-01-13 DIAGNOSIS — I10 Essential (primary) hypertension: Secondary | ICD-10-CM | POA: Diagnosis not present

## 2021-01-13 DIAGNOSIS — Z978 Presence of other specified devices: Secondary | ICD-10-CM

## 2021-01-13 DIAGNOSIS — K219 Gastro-esophageal reflux disease without esophagitis: Secondary | ICD-10-CM | POA: Diagnosis not present

## 2021-01-13 DIAGNOSIS — G910 Communicating hydrocephalus: Secondary | ICD-10-CM | POA: Diagnosis not present

## 2021-01-13 DIAGNOSIS — Z982 Presence of cerebrospinal fluid drainage device: Secondary | ICD-10-CM

## 2021-01-13 DIAGNOSIS — G9341 Metabolic encephalopathy: Secondary | ICD-10-CM | POA: Diagnosis present

## 2021-01-13 DIAGNOSIS — D72829 Elevated white blood cell count, unspecified: Secondary | ICD-10-CM | POA: Diagnosis present

## 2021-01-13 LAB — GLUCOSE, CAPILLARY
Glucose-Capillary: 133 mg/dL — ABNORMAL HIGH (ref 70–99)
Glucose-Capillary: 144 mg/dL — ABNORMAL HIGH (ref 70–99)
Glucose-Capillary: 231 mg/dL — ABNORMAL HIGH (ref 70–99)
Glucose-Capillary: 182 mg/dL — ABNORMAL HIGH (ref 70–99)
Glucose-Capillary: 162 mg/dL — ABNORMAL HIGH (ref 70–99)

## 2021-01-13 LAB — BASIC METABOLIC PANEL
Anion gap: 9 (ref 5–15)
BUN: 20 mg/dL (ref 8–23)
CO2: 26 mmol/L (ref 22–32)
Calcium: 8.6 mg/dL — ABNORMAL LOW (ref 8.9–10.3)
Chloride: 105 mmol/L (ref 98–111)
Creatinine, Ser: 0.83 mg/dL (ref 0.61–1.24)
GFR, Estimated: >60 mL/min (ref >60)
Glucose, Bld: 127 mg/dL — ABNORMAL HIGH (ref 70–99)
Potassium: 3.5 mmol/L (ref 3.5–5.1)
Sodium: 140 mmol/L (ref 135–145)

## 2021-01-13 LAB — CBC WITH DIFFERENTIAL/PLATELET
Abs Immature Granulocytes: 0.09 10*3/uL — ABNORMAL HIGH (ref 0.00–0.07)
Basophils Absolute: 0 10*3/uL (ref 0.0–0.1)
Basophils Relative: 0 %
Eosinophils Absolute: 0 10*3/uL (ref 0.0–0.5)
Eosinophils Relative: 0 %
HCT: 41.4 % (ref 39.0–52.0)
Hemoglobin: 14.9 g/dL (ref 13.0–17.0)
Immature Granulocytes: 1 %
Lymphocytes Relative: 7 %
Lymphs Abs: 0.9 10*3/uL (ref 0.7–4.0)
MCH: 33.1 pg (ref 26.0–34.0)
MCHC: 36 g/dL (ref 30.0–36.0)
MCV: 92 fL (ref 80.0–100.0)
Monocytes Absolute: 1.1 10*3/uL — ABNORMAL HIGH (ref 0.1–1.0)
Monocytes Relative: 9 %
Neutro Abs: 10.2 10*3/uL — ABNORMAL HIGH (ref 1.7–7.7)
Neutrophils Relative %: 83 %
Platelets: 345 10*3/uL (ref 150–400)
RBC: 4.5 MIL/uL (ref 4.22–5.81)
RDW: 11.8 % (ref 11.5–15.5)
WBC: 12.3 10*3/uL — ABNORMAL HIGH (ref 4.0–10.5)
nRBC: 0 % (ref 0.0–0.2)

## 2021-01-13 LAB — MAGNESIUM: Magnesium: 2.3 mg/dL (ref 1.7–2.4)

## 2021-01-13 LAB — PHOSPHORUS: Phosphorus: 2.6 mg/dL (ref 2.5–4.6)

## 2021-01-13 MED ORDER — HYDRALAZINE HCL 20 MG/ML IJ SOLN
5.0000 mg | INTRAMUSCULAR | Status: DC | PRN
  Administered 2021-01-13: 5 mg via INTRAVENOUS
  Filled 2021-01-13: qty 1

## 2021-01-13 MED ORDER — ADULT MULTIVITAMIN W/MINERALS CH
1.0000 | ORAL_TABLET | Freq: Every day | ORAL | Status: DC
  Administered 2021-01-14 – 2021-01-15 (×2): 1 via ORAL
  Filled 2021-01-13 (×2): qty 1

## 2021-01-13 MED ORDER — POTASSIUM CHLORIDE 20 MEQ PO PACK
40.0000 meq | PACK | Freq: Once | ORAL | Status: AC
  Administered 2021-01-13: 40 meq
  Filled 2021-01-13: qty 2

## 2021-01-13 MED ORDER — BETHANECHOL CHLORIDE 10 MG PO TABS
10.0000 mg | ORAL_TABLET | Freq: Three times a day (TID) | ORAL | Status: DC
  Administered 2021-01-13 – 2021-01-17 (×13): 10 mg
  Filled 2021-01-13 (×15): qty 1

## 2021-01-13 MED ORDER — NEPRO/CARBSTEADY PO LIQD
237.0000 mL | Freq: Three times a day (TID) | ORAL | Status: DC
  Administered 2021-01-13: 237 mL via ORAL

## 2021-01-13 MED ORDER — LACTATED RINGERS IV BOLUS
500.0000 mL | Freq: Once | INTRAVENOUS | Status: AC
  Administered 2021-01-13: 500 mL via INTRAVENOUS

## 2021-01-13 NOTE — Progress Notes (Signed)
NAME:  Eric [name], MRN:  [id number], DOB:  Nov 17, 1945, LOS: 5 ADMISSION DATE:  01/08/2021, CONSULTATION DATE:  01/08/2021 REFERRING MD:  Dr. Lacinda Axon, CHIEF COMPLAINT:  Agitation/Delirum   Brief Pt Description / Synopsis:  75 y.o. male with Hydrocephalus status post elective Right Frontal VP Shunt insertion.  Post-op with acute delirium/agitation felt to be due to complication of anesthesia plus Hyponatremia (Na+ 129) requiring Precedex drip.  History of Present Illness:  Eric [name] is a 75 y.o. Male with a past medical history as listed below, who presented for elective Right frontal VP shunt insertion on 01/08/21 due to Hydrocephalus.  Post-op in PACU, he was noted to be awake, but confused/combative/uncooperative.  He was given Fentanyl and Haldol without improvement in mental status.  He was then transferred to ICU.  Upon arrival to ICU he remains very agitated and combative.  Dr. Lacinda Axon with Neurosurgery has consulted PCCM for assistance with management of agitation/delirium.  He was placed on Precedex drip.  Workup shows Sodium 129 and glucose 206, all other labs unremarkable.  Urinalysis, serum osmolality, urine osmolality and urine sodium are currently pending.  Micro Data:  01/04/21: SARS-CoV-2 antigen>>negative 01/08/21: MRSA PCR>>negative  Antimicrobials:  Cefazolin 7/25 x1 dose (surgical prophylaxis)  Significant Hospital Events: Including procedures, antibiotic start and stop dates in addition to other pertinent events   01/08/21: underwent elective right frontal VQ shunt.  Post-op with delirium/agitation requiring Precedex gtt.  PCCM consulted. Found to be Hyponatremic (Na 129) 01/09/21: pt with continued delirium/combativeness still requiring Precedex gtt.  Will consult neurology  01/10/21: Pt with worsening agitation overnight despite scheduled depacon; precedex gtt; prn fentanyl, and benadryl.  Felt benadryl made delirium worse  7/29: Patient required reinitiation of Precedex  infusion overnight, it was stopped during daytime Dobbhoff was placed 7/30: Patient remained off Precedex infusion, Dobbhoff in place, will start tube feeds  Interim History / Subjective:  Dobbhoff has to be replaced overnight due to malpositioning Remained afebrile Precedex remains off, he was agitated requiring Haldol as he could not get Seroquel overnight  Objective   Blood pressure (!) 153/77, pulse (!) 112, temperature 99.7 F (37.6 C), temperature source Axillary, resp. rate (!) 24, SpO2 94 %.        Intake/Output Summary (Last 24 hours) at 01/13/2021 0825 Last data filed at 01/13/2021 0700 Gross per 24 hour  Intake 347.54 ml  Output 1150 ml  Net -802.46 ml   There were no vitals filed for this visit.  Examination: General: Acutely ill appearing elderly male, lying in the bed HENT: Status post right frontal VP shunt placement, dressing clean dry and intact at incision site, neck supple, no JVD.  Severely dry mucous membranes Lungs: Reduced air entry all over, no rhonchi or wheezes Cardiovascular: Regular rate and rhythm, no murmur appreciated Abdomen: Rounded, soft, nondistended, no guarding rebound tenderness, bowel sounds positive x4 Extremities: Normal bulk and tone, no deformities, no edema Neuro: Awake, following commands, dysarthric GU: indwelling foley in place  Resolved Hospital Problem list   Hyponatremia  Hypophosphatemia Paraphimosis  Assessment & Plan:   Acute postop delirium with agitation and combativeness   Normal pressure hydrocephalus status post elective Right Frontal VP Shunt insertion 07/25 Patient remains off Precedex infusion Continue supportive care Continue Seroquel at night and melatonin Continue as needed Haldol Try to come off of restraints Continue Depakote for now, will stop once Seroquel started working Continue clonidine patch 0.2 mg Continue sitter at bedside for pt safety   HTN Continue  lisinopril and HCTZ  Type II diabetes  mellitus  CBG's q4hrs  Continue sliding scale insulin with CBG goal 140-180   Best Practice (right click and "Reselect all SmartList Selections" daily)  Diet/type: NPO; start tube feeds DVT prophylaxis: SCD GI prophylaxis: PPI Lines: N/A Foley:  Yes; discontinue Foley today Code Status:  full code Last date of multidisciplinary goals of care discussion [07/29] Patient's wife was updated at bedside  Labs   CBC: Recent Labs  Lab 01/09/21 0320 01/10/21 0441 01/11/21 0535 01/12/21 0858 01/13/21 0515  WBC 10.2 8.8 8.6 7.5 12.3*  NEUTROABS  --  7.0 6.7 5.7 10.2*  HGB 14.3 13.8 13.9 14.6 14.9  HCT 39.2 37.9* 39.3 39.3 41.4  MCV 91.0 91.5 92.9 91.0 92.0  PLT 283 243 233 245 [id number]    Basic Metabolic Panel: Recent Labs  Lab 01/09/21 0320 01/09/21 0730 01/09/21 1600 01/10/21 0441 01/11/21 0535 01/12/21 0858 01/13/21 0515  NA 136   < > 136 137 136 137 140  K 3.9  --   --  3.3* 3.7 4.1 3.5  CL 105  --   --  104 102 105 105  CO2 26  --   --  '28 25 27 26  '$ GLUCOSE 192*  --   --  113* 109* 129* 127*  BUN 15  --   --  '18 18 23 20  '$ CREATININE 0.62  --   --  0.60* 0.67 0.62 0.83  CALCIUM 8.5*  --   --  8.3* 8.2* 8.2* 8.6*  MG 2.2  --   --  2.1 2.2  --  2.3  PHOS 3.0  --   --  1.9* 2.9  --  2.6   < > = values in this interval not displayed.   GFR: Estimated Creatinine Clearance: 81.5 mL/min (by C-G formula based on SCr of 0.83 mg/dL). Recent Labs  Lab 01/10/21 0441 01/11/21 0535 01/12/21 0858 01/13/21 0515  WBC 8.8 8.6 7.5 12.3*    Liver Function Tests: Recent Labs  Lab 01/12/21 0858  AST 16  ALT 23  ALKPHOS 45  BILITOT 1.2  PROT 5.8*  ALBUMIN 2.9*   No results for input(s): LIPASE, AMYLASE in the last 168 hours. Recent Labs  Lab 01/10/21 0441 01/12/21 0858  AMMONIA 15 18    ABG No results found for: PHART, PCO2ART, PO2ART, HCO3, TCO2, ACIDBASEDEF, O2SAT   Coagulation Profile: No results for input(s): INR, PROTIME in the last 168 hours.  Cardiac  Enzymes: No results for input(s): CKTOTAL, CKMB, CKMBINDEX, TROPONINI in the last 168 hours.  HbA1C: Hgb A1c MFr Bld  Date/Time Value Ref Range Status  01/09/2021 03:20 AM 6.8 (H) 4.8 - 5.6 % Final    Comment:    (NOTE)         Prediabetes: 5.7 - 6.4         Diabetes: >6.4         Glycemic control for adults with diabetes: <7.0     CBG: Recent Labs  Lab 01/12/21 1544 01/12/21 1926 01/12/21 2259 01/13/21 0329 01/13/21 0743  GLUCAP 146* 110* 117* 133* 144*    Total critical care time: 32 minutes  Performed by: [place] care time was exclusive of separately billable procedures and treating other patients.   Critical care was necessary to treat or prevent imminent or life-threatening deterioration.   Critical care was time spent personally by me on the following activities: development of treatment plan with patient and/or surrogate  as well as nursing, discussions with consultants, evaluation of patient's response to treatment, examination of patient, obtaining history from patient or surrogate, ordering and performing treatments and interventions, ordering and review of laboratory studies, ordering and review of radiographic studies, pulse oximetry and re-evaluation of patient's condition.   Jacky Kindle MD [facility] See Amion for pager If no response to pager, please call [phone] until 7pm After 7pm, Please call E-link [phone]

## 2021-01-13 NOTE — Progress Notes (Signed)
  Speech Language Pathology Treatment: Dysphagia  Patient Details Name: Eric [name] MRN: [id number] DOB: [birth year] Today's Date: 01/13/2021 Time: 0910-0927 SLP Time Calculation (min) (ACUTE ONLY): 17 min  Assessment / Plan / Recommendation Clinical Impression  Pt seen for ongoing dysphagia treatment. Pt's wife and nurse present throughout session. Pt was awake, alert with delayed responses. Pt's speech intelligibility fluctuated. Skilled observation was provided of pt consuming puree, nectar thick liquids via spoon and ice chips via spoon. As consumption increased, pt's swallow response also appeared more timely. When consuming nectar thick liquids via spoon and puree, pt's hyoid movement as palpated at his neck appeared swifter and had wider range of movement than when he consumed ice chips. Pt's voice and vitals were stable throughout consumption. Extensive education provided to pt's wife on aspiration risks with any PO intake and pt's high risk of developing dehydration without supplemental support via NG. She voiced understanding of information and was agreeable with decision to place pt on dysphagia 1 diet with nectar thick liquids via spoon, medicine crushed in puree as able.   ST to follow for further diet advancement as appropriate.     HPI HPI: GABRIEN [name] is a 75 y.o. 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 HE is status post elective Right Frontal VP Shunt insertion.  Post-op with acute delirium/agitation felt to be due to complication of anesthesia requiring Precedex drip. 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      SLP Plan  Continue with current plan of care        Recommendations  Diet recommendations: Dysphagia 1 (puree);Nectar-thick liquid Liquids provided via: Teaspoon;No straw Medication Administration: Crushed with puree Supervision: Full supervision/cueing for compensatory strategies;Trained caregiver to feed patient;Staff to assist with self feeding Compensations: Minimize environmental distractions;Slow rate;Small sips/bites Postural Changes and/or Swallow Maneuvers: Seated upright 90 degrees;Upright 30-60 min after meal                Oral Care Recommendations: Oral care before and after PO Follow up Recommendations:  (TBD) SLP Visit Diagnosis: Dysphagia, unspecified (R13.10) Plan: Continue with current plan of care       [name] M.S., CCC-SLP, [place] [facility] [phone]                    [name] 01/13/2021, 10:03 AM

## 2021-01-13 NOTE — Progress Notes (Signed)
Verbal order received from NP to replace guide wire, advance dobhoff by 3cm and then attempt flush or obtain gastric contents. Dobhoff advanced from 82 cm to 85 cm and still unable to flush or draw back gastric contents.

## 2021-01-13 NOTE — Consult Note (Signed)
Medical Consultation   ZENON RUPPE  [id number]  DOB: 09-13-1945  DOA: 01/08/2021  PCP: Ileana Roup, MD   Outpatient Specialists:   Requesting physician: -Dr. Cari Caraway of neurosurgery  Reason for consultation: -Altered mental status and delirium post VP shunt procedure -Management of her chronic medical issues  History of Present Illness: TYRIQ COULTHARD is an 75 y.o. male with PMH of hypertension, hyperlipidemia, diabetes mellitus, GERD, depression, anxiety, BPH, hydrocephalus, who is admitted by neurosurgery for VP shunt placement for hydrocephalus. We are asked to to consult for management of chronic medical issues and altered mental status/delirium.  Pt is POD #5 from right frontal VP shunt placement for hydrocephalus. After the surgery, pt developed confusion, agitation, suspecting posterior surgery delirium.  PCCM was consulted.  Patient was initially treated with Precedex.  He has been off Precedex for more than 24 hours.  Patient is currently on tube feeding.  Per his wife at the bedside, at his normal baseline, patient is alert and orientated x3.  Currently patient knows his own name, but is not orientated to the place and time. He moves all extremities.  No facial droop or slurred speech. Patient has mild dry cough, no chest pain, shortness breath, nausea, vomiting, diarrhea or abdominal pain.  No symptoms of UTI.  No fever or chills.  Lab: WBC 12.3, electrolytes renal function okay. Temperature normal, blood pressure 151/85, heart rate 114, RR 19, oxygen saturation 94% on room air.  Review of Systems: Could not reviewed due to altered mental status.   Past Medical History: Past Medical History:  Diagnosis Date   Anxiety    Arthritis    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 symptoms    Diabetes (HCC)    no meds-last a1c 5.7   Fall    GERD (gastroesophageal reflux disease)    HLD (hyperlipidemia)    Hydrocephalus (HCC)    Hypertension     Unsteady gait     Past Surgical History: Past Surgical History:  Procedure Laterality Date   arm surgery Left    fracture   COLONOSCOPY     TONSILLECTOMY  1962   VENTRICULOPERITONEAL SHUNT Right 01/08/2021   Procedure: RIGHT FRONTAL VENTRICULOPERITONEAL SHUNT INSERTION;  Surgeon: Deetta Perla, MD;  Location: [facility] ORS;  Service: Neurosurgery;  Laterality: Right;     Allergies:  No Known Allergies   Social History:  reports that he quit smoking about 15 years ago. His smoking use included cigarettes. He has a 84.00 pack-year smoking history. He has never used smokeless tobacco. He reports that he does not drink alcohol and does not use drugs.   Family History: Family History  Problem Relation Age of Onset   Prostate cancer Neg Hx    Kidney cancer Neg Hx    Bladder Cancer Neg Hx      Physical Exam: Vitals:   01/13/21 0600 01/13/21 0700 01/13/21 0800 01/13/21 0900  BP: (!) 160/86 (!) 153/77 (!) 147/75 (!) 155/77  Pulse: (!) 116 (!) 112 (!) 106 (!) 114  Resp: (!) 25 (!) 24 (!) 22 (!) 25  Temp:      TempSrc:      SpO2: 94% 94% 94% 93%   General: Not in acute distress HEENT: PERRL, EOMI, no scleral icterus, No JVD or bruit Cardiac: S1/S2, RRR, No murmurs, gallops or rubs Pulm: No rales, wheezing, rhonchi or rubs. Abd: Soft, nondistended, nontender, no organomegaly, BS present Ext:  No edema. 2+DP/PT pulse bilaterally Musculoskeletal: No joint deformities, erythema, or stiffness, ROM full Skin: No rashes.  Neuro: Confused, knows his own name, not oriented to place and time.  Patient is calm currently. Cranial nerves II-XII grossly intact, moves all extremities. Psych: Patient is not psychotic.   Data reviewed:  I have personally reviewed following labs and imaging studies Labs:  CBC: Recent Labs  Lab 01/09/21 0320 01/10/21 0441 01/11/21 0535 01/12/21 0858 01/13/21 0515  WBC 10.2 8.8 8.6 7.5 12.3*  NEUTROABS  --  7.0 6.7 5.7 10.2*  HGB 14.3 13.8 13.9 14.6 14.9   HCT 39.2 37.9* 39.3 39.3 41.4  MCV 91.0 91.5 92.9 91.0 92.0  PLT 283 243 233 245 [id number]    Basic Metabolic Panel: Recent Labs  Lab 01/09/21 0320 01/09/21 0730 01/09/21 1600 01/10/21 0441 01/11/21 0535 01/12/21 0858 01/13/21 0515  NA 136   < > 136 137 136 137 140  K 3.9  --   --  3.3* 3.7 4.1 3.5  CL 105  --   --  104 102 105 105  CO2 26  --   --  '28 25 27 26  '$ GLUCOSE 192*  --   --  113* 109* 129* 127*  BUN 15  --   --  '18 18 23 20  '$ CREATININE 0.62  --   --  0.60* 0.67 0.62 0.83  CALCIUM 8.5*  --   --  8.3* 8.2* 8.2* 8.6*  MG 2.2  --   --  2.1 2.2  --  2.3  PHOS 3.0  --   --  1.9* 2.9  --  2.6   < > = values in this interval not displayed.   GFR Estimated Creatinine Clearance: 81.5 mL/min (by C-G formula based on SCr of 0.83 mg/dL). Liver Function Tests: Recent Labs  Lab 01/12/21 0858  AST 16  ALT 23  ALKPHOS 45  BILITOT 1.2  PROT 5.8*  ALBUMIN 2.9*   No results for input(s): LIPASE, AMYLASE in the last 168 hours. Recent Labs  Lab 01/10/21 0441 01/12/21 0858  AMMONIA 15 18   Coagulation profile No results for input(s): INR, PROTIME in the last 168 hours.  Cardiac Enzymes: No results for input(s): CKTOTAL, CKMB, CKMBINDEX, TROPONINI in the last 168 hours. BNP: Invalid input(s): POCBNP CBG: Recent Labs  Lab 01/12/21 1544 01/12/21 1926 01/12/21 2259 01/13/21 0329 01/13/21 0743  GLUCAP 146* 110* 117* 133* 144*   D-Dimer No results for input(s): DDIMER in the last 72 hours. Hgb A1c No results for input(s): HGBA1C in the last 72 hours. Lipid Profile No results for input(s): CHOL, HDL, LDLCALC, TRIG, CHOLHDL, LDLDIRECT in the last 72 hours. Thyroid function studies No results for input(s): TSH, T4TOTAL, T3FREE, THYROIDAB in the last 72 hours.  Invalid input(s): FREET3 Anemia work up Recent Labs    01/11/21 0535  VITAMINB12 332   Urinalysis    Component Value Date/Time   COLORURINE YELLOW (A) 01/08/2021 1656   APPEARANCEUR CLEAR (A) 01/08/2021  1656   LABSPEC 1.018 01/08/2021 1656   PHURINE 6.0 01/08/2021 1656   GLUCOSEU 50 (A) 01/08/2021 1656   HGBUR LARGE (A) 01/08/2021 1656   BILIRUBINUR NEGATIVE 01/08/2021 1656   KETONESUR NEGATIVE 01/08/2021 1656   PROTEINUR NEGATIVE 01/08/2021 1656   NITRITE NEGATIVE 01/08/2021 1656   [place] NEGATIVE 01/08/2021 1656     Microbiology Recent Results (from the past 240 hour(s))  SARS CORONAVIRUS 2 (TAT 6-24 HRS) Nasopharyngeal Nasopharyngeal Swab     Status:  None   Collection Time: 01/04/21  8:52 AM   Specimen: Nasopharyngeal Swab  Result Value Ref Range Status   SARS Coronavirus 2 NEGATIVE NEGATIVE Final    Comment: (NOTE) SARS-CoV-2 target nucleic acids are NOT DETECTED.  The SARS-CoV-2 RNA is generally detectable in upper and lower respiratory specimens during the acute phase of infection. Negative results do not preclude SARS-CoV-2 infection, do not rule out co-infections with other pathogens, and should not be used as the sole basis for treatment or other patient management decisions. Negative results must be combined with clinical observations, patient history, and epidemiological information. The expected result is Negative.  Fact Sheet for Patients: SugarRoll.be  Fact Sheet for Healthcare Providers: https://www.woods-mathews.com/  This test is not yet approved or cleared by the [place] FDA and  has been authorized for detection and/or diagnosis of SARS-CoV-2 by FDA under an Emergency Use Authorization (EUA). This EUA will remain  in effect (meaning this test can be used) for the duration of the COVID-19 declaration under Se ction 564(b)(1) of the Act, 21 U.S.C. section 360bbb-3(b)(1), unless the authorization is terminated or revoked sooner.  Performed at [facility], [place] [address], [place], [place] [address]   MRSA Next Gen by PCR, Nasal     Status: None   Collection Time: 01/08/21  2:10 PM   Specimen:  Nasal Mucosa; Nasal Swab  Result Value Ref Range Status   MRSA by PCR Next Gen NOT DETECTED NOT DETECTED Final    Comment: (NOTE) The GeneXpert MRSA Assay (FDA approved for NASAL specimens only), is one component of a comprehensive MRSA colonization surveillance program. It is not intended to diagnose MRSA infection nor to guide or monitor treatment for MRSA infections. Test performance is not FDA approved in patients less than 48 years old. Performed at [facility], [address] [place],  [address]        Inpatient Medications:   Scheduled Meds:  bethanechol  10 mg Per Tube TID   Chlorhexidine Gluconate Cloth  6 each Topical Daily   [START ON 01/17/2021] cloNIDine  0.2 mg Transdermal Weekly   cyanocobalamin  1,000 mcg Intramuscular Daily   Followed by   Derrill Memo ON 01/16/2021] vitamin B-12  1,000 mcg Oral Daily   finasteride  5 mg Oral Nightly   heparin injection (subcutaneous)  5,000 Units Subcutaneous Q8H   lisinopril  20 mg Oral Daily   And   hydrochlorothiazide  25 mg Oral Daily   insulin aspart  0-9 Units Subcutaneous Q4H   melatonin  10 mg Oral Nightly   pantoprazole (PROTONIX) IV  40 mg Intravenous Daily   QUEtiapine  50 mg Per Tube QHS   senna  1 tablet Oral BID   tamsulosin  0.4 mg Oral QPC supper   thiamine injection  100 mg Intravenous Daily   Continuous Infusions:  sodium chloride 10 mL/hr at 01/13/21 0700   valproate sodium 500 mg (01/13/21 0922)     Radiological Exams on Admission: DG Abd 1 View  Result Date: 01/13/2021 CLINICAL DATA:  75 year old male enteric tube placement. EXAM: ABDOMEN - 1 VIEW COMPARISON:  01/12/2021 and earlier. FINDINGS: Portable AP semi upright view at 03512 hours. Enteric feeding tube position not significantly changed from yesterday, tube tip at the gastric antrum versus duodenal bulb. Superimposed cholelithiasis. Bowel-gas pattern remains within normal limits. Negative lung bases. Stable visualized osseous  structures. IMPRESSION: Stable feeding tube positions since yesterday, tip at the gastric antrum versus duodenal bulb. Advance  5-6 cm to allow for mid duodenal placement. Electronically Signed   By: Genevie Ann M.D.   On: 01/13/2021 04:19   DG Abd 1 View  Result Date: 01/12/2021 CLINICAL DATA:  Nasogastric tube replacement. EXAM: ABDOMEN - 1 VIEW COMPARISON:  Earlier today at 2:20 p.m. FINDINGS: 5:20 p.m. Feeding tube terminates in the distal stomach or may be entering the proximal duodenum. Nonobstructive bowel gas pattern. Calcified gallstone again identified. IMPRESSION: Feeding tube either in the distal stomach or entering the proximal duodenum. Cholelithiasis. Electronically Signed   By: Abigail Miyamoto M.D.   On: 01/12/2021 17:51   DG Abd 1 View  Result Date: 01/12/2021 CLINICAL DATA:  Nasogastric tube placement. EXAM: ABDOMEN - 1 VIEW COMPARISON:  01/12/2021 at 12:14 p.m. FINDINGS: The tip of a feeding tube has now migrated into the distal aspect of the stomach. No dilated loops of bowel are seen in the included portion of the abdomen to indicate obstruction. A calcified gallstone is again noted. IMPRESSION: Feeding tube tip in the distal stomach. Electronically Signed   By: Logan Bores M.D.   On: 01/12/2021 15:00   DG Abd 1 View  Result Date: 01/12/2021 CLINICAL DATA:  Enteric tube placement EXAM: ABDOMEN - 1 VIEW COMPARISON:  01/08/2021 FINDINGS: Feeding tube tip in the body of the stomach. No dilated bowel loops. IMPRESSION: Feeding tube tip in the body the stomach. Electronically Signed   By: Franchot Gallo M.D.   On: 01/12/2021 12:43    Impression/Recommendations Principal Problem:   Hydrocephalus (HCC) Active Problems:   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 symptoms   Diabetes mellitus without complication (HCC)   Hypertension   Acute metabolic encephalopathy   Leukocytosis   GERD (gastroesophageal reflux disease)  Hydrocephalus ([place]): s/p of VP shunt placement. -Management  per neurosurgery  Acute metabolic encephalopathy: Likely due to postsurgical delirium.  PCCM was consulted initially, treated with Precedex, patient has been off Precedex for more than 24 hours.  Still has intermittent agitation.  Feeding tube is placed, but the patient has started taking dysphagia diet today.  Ammonia level was normal. -Frequent neuro check -Sitter at the bedside -As needed Haldol -d/c Phenergan, use prn zofran for nausea  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 symptoms -Proscar and Flomax  Diabetes mellitus without complication ([place]): Recent A1c 6.8, well controlled.  Patient is not taking medications currently -Sliding scale insulin  Hypertension -Clonidine patch, lisinopril, HCTZ -IV hydralazine as needed  Leukocytosis: WBC 12.3.  No source of infection identified.  No fever.  Likely reactive -Follow-up with CBC  GERD (gastroesophageal reflux disease) -Protonix      Thank you for this consultation.  Our [facility] hospitalist team will follow the patient with you.   Time Spent: 40 min  Ivor Costa M.D. [facility] 01/13/2021, 9:42 AM

## 2021-01-13 NOTE — Progress Notes (Addendum)
Mitts and restraints off near 1000. Pt progressed to dysphagia 1 diet per speech therapy. Dob hoff remains in place per speech therapy recommendation. Pt eating puree diet w/o issues. > 90% of breakfast and lunch trays eaten. Foley catheter removed per order. External catheter applied. Pt voiding.   500 ml LR bolus infused.  Pt worked w/ PT this evening.   PRN haldol and hydralazine given this afternoon.   Pt had LARGE BM this evening. Following BM, pt appears more relaxed and calm. HR improved following large BM and pt resting.    Sitter remains @ bedside.

## 2021-01-13 NOTE — Progress Notes (Signed)
Per NP ok to pull current dobhoff tube and replace with new dobhoff tube.

## 2021-01-13 NOTE — Progress Notes (Signed)
NP notified unable to push medications via dobhoff. Unable to pull back gastric contents also. See new orders.

## 2021-01-13 NOTE — Progress Notes (Addendum)
   Progress Note  History: DMARCUS ALISON is POD#5 from right frontal VP shunt placement for hydrocephalus. He has dementia at baseline which was exacerbated by anesthesia from his recent surgery.    POD5: Patient has been off precedex for 24 hours.  He has a Dobhoff and will start tube feeding today.   Physical Exam: Vitals:   01/13/21 0600 01/13/21 0700  BP: (!) 160/86 (!) 153/77  Pulse: (!) 116 (!) 112  Resp: (!) 25 (!) 24  Temp:    SpO2: 94% 94%    AA to person, year, and location (hospital).  Following simple one step commands.  PERRL Incisions c/d/I   Data:  Recent Labs  Lab 01/11/21 0535 01/12/21 0858 01/13/21 0515  NA 136 137 140  K 3.7 4.1 3.5  CL 102 105 105  CO2 '25 27 26  '$ BUN '18 23 20  '$ CREATININE 0.67 0.62 0.83  GLUCOSE 109* 129* 127*  CALCIUM 8.2* 8.2* 8.6*   Recent Labs  Lab 01/12/21 0858  AST 16  ALT 23  ALKPHOS 45     Recent Labs  Lab 01/11/21 0535 01/12/21 0858 01/13/21 0515  WBC 8.6 7.5 12.3*  HGB 13.9 14.6 14.9  HCT 39.3 39.3 41.4  PLT 233 245 345   No results for input(s): APTT, INR in the last 168 hours.       Other tests/results: Stunt series completed post-op without noted complication.   CT head 01/10/21 showed good placement of catheter without any hemorrhage  Assessment/Plan:  DEZMON TORONTO  is a 75 y.o male s/p right frontal VP shunt. He is improving from post-operative delirium.  - DVT prophylaxis (On Heparin 5,000 q8) - PTOT  - Appreciate intensive care and neurology assistance.  - Likely transfer out of unit - will request medical assistance - start tube feeding today per ICU  Meade Maw MD

## 2021-01-13 NOTE — Progress Notes (Signed)
Physical Therapy Treatment Patient Details Name: Eric [name] MRN: [id number] DOB: 1945/11/26 Today's Date: 01/13/2021    History of Present Illness Patient is a 75 y.o. male with Hydrocephalus status post elective Right Frontal VP Shunt insertion.  Post-op with acute delirium/agitation felt to be due to complication of anesthesia plus Hyponatremia requiring Precedex drip.    PT Comments    Pt struggled t/o the session with lethargy, fatigue and general inability to consistently follow any cuing.  He did managed ~10 minutes of standing but showed poor/forward flexed posture, inability to maintain appropriate walker use, need for constant assist to insure safety/upright and had gradually increasing HR (to 140) with the effort.  Difficult with LE exercises, again inconsistent and unable to really follow instructions this date.  Per nursing he was more alert/awake yesterday, did not sleep much last night.  Follow Up Recommendations  SNF (CIR per progress)     Equipment Recommendations       Recommendations for Other Services       Precautions / Restrictions Precautions Precautions: Fall Restrictions Weight Bearing Restrictions: No    Mobility  Bed Mobility Overal bed mobility: Needs Assistance Bed Mobility: Supine to Sit;Sit to Supine     Supine to sit: Mod assist Sit to supine: Mod assist        Transfers Overall transfer level: Needs assistance Equipment used: Rolling walker (2 wheeled) Transfers: Sit to/from Stand Sit to Stand: Mod assist         General transfer comment: Pt continues to struggle with consistency but did show good effort with getting to standing, needing assist to rise and to maintain standing.  Ambulation/Gait             General Gait Details: unable to ambulate this date.  Managed a few side steps along EOB with heavy cuing and direct assist.  Pt again inconsistent with keeping UEs on walker, keeping walking with 4 points on the ground,  trying to stay inside walker and generally needing consistent hands on assist just to maintain upright.   Stairs             Wheelchair Mobility    Modified Rankin (Stroke Patients Only)       Balance Overall balance assessment: Needs assistance;History of Falls Sitting-balance support: Feet supported Sitting balance-Leahy Scale: Fair     Standing balance support: Bilateral upper extremity supported Standing balance-Leahy Scale: Poor Standing balance comment: forward flexed and unsteady t/o the entirety of prolonged standing bout                            Cognition Arousal/Alertness: Lethargic Behavior During Therapy: Flat affect Overall Cognitive Status: Impaired/Different from baseline                                        Exercises Other Exercises Other Exercises: performed heel slides, hip ab/ad, ankle pumps.  all without ability to consistently perfom bilaterally.  occasional voluntary effort but no real consistency    General Comments General comments (skin integrity, edema, etc.): Shortly after attaining standing (never really upright) he started having a large BM.  With heavy assist he was able to maintain ~10 minutes of standing during clean up, attempts at side stepping/marching were largely unsafe and unsuccessful.  Pt's HR gradually increased from ~120 to >140 over the course of "prolonged"  ambulation/activity      Pertinent Vitals/Pain Pain Assessment: No/denies pain    Home Living                      Prior Function            PT Goals (current goals can now be found in the care plan section) Progress towards PT goals: Progressing toward goals    Frequency    7X/week      PT Plan Current plan remains appropriate    Co-evaluation              AM-PAC PT "6 Clicks" Mobility   Outcome Measure  Help needed turning from your back to your side while in a flat bed without using bedrails?: A  Little Help needed moving from lying on your back to sitting on the side of a flat bed without using bedrails?: A Lot Help needed moving to and from a bed to a chair (including a wheelchair)?: A Lot Help needed standing up from a chair using your arms (e.g., wheelchair or bedside chair)?: A Lot Help needed to walk in hospital room?: A Lot Help needed climbing 3-5 steps with a railing? : Total 6 Click Score: 12    End of Session Equipment Utilized During Treatment: Gait belt Activity Tolerance: Patient limited by fatigue Patient left: with call bell/phone within reach;with bed alarm set;with nursing/sitter in room Nurse Communication: Mobility status PT Visit Diagnosis: Unsteadiness on feet (R26.81);Muscle weakness (generalized) (M62.81);Other abnormalities of gait and mobility (R26.89)     Time: [id number] PT Time Calculation (min) (ACUTE ONLY): 28 min  Charges:  $Therapeutic Exercise: 8-22 mins                     Kreg Shropshire, DPT 01/13/2021, 7:00 PM

## 2021-01-14 ENCOUNTER — Inpatient Hospital Stay: Payer: Medicare Other

## 2021-01-14 DIAGNOSIS — N400 Enlarged prostate without lower urinary tract symptoms: Secondary | ICD-10-CM | POA: Diagnosis not present

## 2021-01-14 DIAGNOSIS — E119 Type 2 diabetes mellitus without complications: Secondary | ICD-10-CM | POA: Diagnosis not present

## 2021-01-14 DIAGNOSIS — D72829 Elevated white blood cell count, unspecified: Secondary | ICD-10-CM | POA: Diagnosis not present

## 2021-01-14 DIAGNOSIS — K219 Gastro-esophageal reflux disease without esophagitis: Secondary | ICD-10-CM | POA: Diagnosis not present

## 2021-01-14 LAB — GLUCOSE, CAPILLARY
Glucose-Capillary: 129 mg/dL — ABNORMAL HIGH (ref 70–99)
Glucose-Capillary: 134 mg/dL — ABNORMAL HIGH (ref 70–99)
Glucose-Capillary: 135 mg/dL — ABNORMAL HIGH (ref 70–99)
Glucose-Capillary: 137 mg/dL — ABNORMAL HIGH (ref 70–99)
Glucose-Capillary: 151 mg/dL — ABNORMAL HIGH (ref 70–99)
Glucose-Capillary: 159 mg/dL — ABNORMAL HIGH (ref 70–99)
Glucose-Capillary: 175 mg/dL — ABNORMAL HIGH (ref 70–99)

## 2021-01-14 LAB — CBC
HCT: 39.9 % (ref 39.0–52.0)
Hemoglobin: 14.3 g/dL (ref 13.0–17.0)
MCH: 33.8 pg (ref 26.0–34.0)
MCHC: 35.8 g/dL (ref 30.0–36.0)
MCV: 94.3 fL (ref 80.0–100.0)
Platelets: 290 10*3/uL (ref 150–400)
RBC: 4.23 MIL/uL (ref 4.22–5.81)
RDW: 12.3 % (ref 11.5–15.5)
WBC: 10.8 10*3/uL — ABNORMAL HIGH (ref 4.0–10.5)
nRBC: 0 % (ref 0.0–0.2)

## 2021-01-14 LAB — MAGNESIUM: Magnesium: 2.5 mg/dL — ABNORMAL HIGH (ref 1.7–2.4)

## 2021-01-14 LAB — BASIC METABOLIC PANEL
Anion gap: 5 (ref 5–15)
BUN: 21 mg/dL (ref 8–23)
CO2: 26 mmol/L (ref 22–32)
Calcium: 8.3 mg/dL — ABNORMAL LOW (ref 8.9–10.3)
Chloride: 112 mmol/L — ABNORMAL HIGH (ref 98–111)
Creatinine, Ser: 0.85 mg/dL (ref 0.61–1.24)
GFR, Estimated: >60 mL/min (ref >60)
Glucose, Bld: 142 mg/dL — ABNORMAL HIGH (ref 70–99)
Potassium: 3.1 mmol/L — ABNORMAL LOW (ref 3.5–5.1)
Sodium: 143 mmol/L (ref 135–145)

## 2021-01-14 LAB — PHOSPHORUS: Phosphorus: 2.8 mg/dL (ref 2.5–4.6)

## 2021-01-14 MED ORDER — ORAL CARE MOUTH RINSE
15.0000 mL | Freq: Two times a day (BID) | OROMUCOSAL | Status: DC
  Administered 2021-01-14 – 2021-01-31 (×35): 15 mL via OROMUCOSAL

## 2021-01-14 MED ORDER — POTASSIUM CHLORIDE 20 MEQ PO PACK
20.0000 meq | PACK | ORAL | Status: AC
  Administered 2021-01-14 (×2): 20 meq
  Filled 2021-01-14 (×2): qty 1

## 2021-01-14 MED ORDER — POTASSIUM CHLORIDE 10 MEQ/100ML IV SOLN
10.0000 meq | INTRAVENOUS | Status: AC
Start: 2021-01-14 — End: 2021-01-14
  Administered 2021-01-14 (×2): 10 meq via INTRAVENOUS
  Filled 2021-01-14 (×2): qty 100

## 2021-01-14 NOTE — Progress Notes (Signed)
Patient has been very drowsy this shift.  No prn medications given.  Wife has concerns about how much he has slept today and that he is not awake to eat or perform therapies.  Informed her that he did not receive any medications overnight or this shift as prns to cause this.  Patient has also been coughing a bunch this shift and writer has suctioned the back of his throat with yanker many times.  Informed her that with him being so drowsy and the amount he is coughing today that him eating was probably not a safe choice today and writer would like speech to see again.  Expressed concern that he may be aspirating when eating or drinking.  Wife insistent that he eat lunch and she did attempt to feed him.  He has continued to cough throughout shift.  Temp has remained in 99's this shift.  Wife called back and voiced her concerns again this evening about him being sleepy and not eating and that she really wanted him to eat dinner.  Writer performed oral care and gave patient a few spoon fulls of tea.  Appeared to swallow appropriately, also attempted a bite of each food and patient made a face of dislike and wanted to spit out.  Given about half of the nectar tea and refused anymore.  Will continue to monitor.

## 2021-01-14 NOTE — Progress Notes (Signed)
OT Cancellation Note  Patient Details Name: Eric [name] MRN: [id number] DOB: Dec 24, 1945   Cancelled Treatment:    Reason Eval/Treat Not Completed: Fatigue/lethargy limiting ability to participate;Patient's level of consciousness  Pt very lethargic this date. OT attempts to rouse with tactile and verbal stimuli as well as positional change and turning lights on. Pt still very difficult to rouse, essentially only fluttering eyes to painful stimuli and otherwise not opening them or attending. Will hold OT at this time and f/u for treatment another date/time as able. Thank you.  Gerrianne Scale, [place], OTR/L ascom [phone] 01/14/21, 3:02 PM

## 2021-01-14 NOTE — Progress Notes (Signed)
Physical Therapy Treatment Patient Details Name: Eric [name] MRN: [id number] DOB: 04/25/1946 Today's Date: 01/14/2021    History of Present Illness Patient is a 75 y.o. male with Hydrocephalus status post elective Right Frontal VP Shunt insertion.  Post-op with acute delirium/agitation felt to be due to complication of anesthesia plus Hyponatremia requiring Precedex drip.    PT Comments    Patient was initially very lethargic again today with some difficulty arousing. After sternal rub and calling patient name he was able to open his eyes and participate some including mod A with bed mobility and max assist to transfer (sit to stand) today with difficulty with posture- forward head and flexed hips. He was able to respond to pre-gait standing for approx 6 min today yet unable to take any meaningful steps. Treatment limited secondary to ongoing lethargy. He will benefit from continued skilled PT services to focus on continued strengthening and functional mobility training to continue to progress toward goals. No change in recommendation.   Follow Up Recommendations  SNF     Equipment Recommendations       Recommendations for Other Services       Precautions / Restrictions Precautions Precautions: Fall Restrictions Weight Bearing Restrictions: No    Mobility  Bed Mobility Overal bed mobility: Needs Assistance Bed Mobility: Supine to Sit;Sit to Supine     Supine to sit: Mod assist Sit to supine: Max assist   General bed mobility comments: Scoop transfer from sitting to supine. +2 using pad to manuever toward head of bed. Patient Response: Cooperative  Transfers Overall transfer level: Needs assistance Equipment used: Rolling walker (2 wheeled)   Sit to Stand: Max assist         General transfer comment: Patient required Max assist with difficulty extending hips and standing erect. He was able to grip walker today  and eventually stand up straighter with increased time  and verbal cues.  Ambulation/Gait   Gait Distance (Feet):  (Patient unable to lift feet or march but was able to dynamically weight shift today.)         General Gait Details: Patient was able to static stand for approx 6 min- with mod assist to maintain trunk control. He was able to move walker slightly and perform lateral weight shift but unable to lift either leg or take a forward or backward step today.   Stairs             Wheelchair Mobility    Modified Rankin (Stroke Patients Only)       Balance Overall balance assessment: Needs assistance;History of Falls Sitting-balance support: Feet supported;Bilateral upper extremity supported Sitting balance-Leahy Scale: Fair Sitting balance - Comments: Close Min Guard   Standing balance support: Bilateral upper extremity supported Standing balance-Leahy Scale: Poor Standing balance comment: forward trunk  flexed and difficulty extending hips throughout standing                            Cognition Arousal/Alertness: Lethargic Behavior During Therapy: Flat affect Overall Cognitive Status: Impaired/Different from baseline Area of Impairment: Orientation;Following commands;Safety/judgement                 Orientation Level: Disoriented to;Situation     Following Commands: Follows one step commands with increased time Safety/Judgement: Decreased awareness of safety;Decreased awareness of deficits     General Comments: Patient lethargic requiring increased time to rspond - able to open eyes after several attempts to arouse- Able  to follow with eyes and able to nod intermittently and vocalize occasionally but difficulty to understand.      Exercises Other Exercises Other Exercises: Patient unable to follow VC to perform LE Exercises so Performed PROM- Hip rotation, FLex/ext/abd/add, Knee flex/ext BLE x 20 reps each.    General Comments General comments (skin integrity, edema, etc.): HR maintained at  109-128bpm throughout standing. Patient was unsuccessful in taking any steps but was able to static stand for approx 6 min.      Pertinent Vitals/Pain Pain Assessment: No/denies pain    Home Living                      Prior Function            PT Goals (current goals can now be found in the care plan section) Acute Rehab PT Goals Patient Stated Goal: none stated PT Goal Formulation: With patient Time For Goal Achievement: 01/25/21 Potential to Achieve Goals: Good Progress towards PT goals: Progressing toward goals    Frequency    7X/week      PT Plan Current plan remains appropriate    Co-evaluation              AM-PAC PT "6 Clicks" Mobility   Outcome Measure  Help needed turning from your back to your side while in a flat bed without using bedrails?: A Little Help needed moving from lying on your back to sitting on the side of a flat bed without using bedrails?: A Lot Help needed moving to and from a bed to a chair (including a wheelchair)?: A Lot Help needed standing up from a chair using your arms (e.g., wheelchair or bedside chair)?: A Lot Help needed to walk in hospital room?: A Lot Help needed climbing 3-5 steps with a railing? : Total 6 Click Score: 12    End of Session Equipment Utilized During Treatment: Gait belt Activity Tolerance: Patient limited by fatigue;Patient limited by lethargy Patient left: with call bell/phone within reach;with bed alarm set;with nursing/sitter in room Nurse Communication: Mobility status PT Visit Diagnosis: Unsteadiness on feet (R26.81);Muscle weakness (generalized) (M62.81);Other abnormalities of gait and mobility (R26.89)     Time: 1342-1420 PT Time Calculation (min) (ACUTE ONLY): 38 min  Charges:  $Therapeutic Activity: 38-52 mins                        Lewis Moccasin, PT 01/14/2021, 3:05 PM

## 2021-01-14 NOTE — Progress Notes (Addendum)
PROGRESS NOTE  Consult-progress note  Eric [name]  [id number] DOB: [birth year] DOA: 01/08/2021 PCP: Ileana Roup, MD   Brief Narrative: Taken from consult note. Eric [name] is an 75 y.o. male with PMH of hypertension, hyperlipidemia, diabetes mellitus, GERD, depression, anxiety, BPH, hydrocephalus, who is admitted by neurosurgery for VP shunt placement for hydrocephalus. We are asked to to consult for management of chronic medical issues and altered mental status/delirium.   Pt is POD #6 from right frontal VP shunt placement for hydrocephalus. After the surgery, pt developed confusion, agitation, suspecting posterior surgery delirium.  PCCM was consulted.  Patient was initially treated with Precedex.  He has been off Precedex for more than 24 hours.  Patient is currently on tube feeding.  Per his wife at the bedside, at his normal baseline, patient is alert and orientated x3.  Currently patient knows his own name, but is not orientated to the place and time. He moves all extremities.  Subjective: Patient was seen and examined today.  Appears very lethargic, having quite a bit of upper respiratory secretions with a weaker cough.  Feeding tube in place.  Awake but not following much commands. Able to tell his name only in a very muffled voice.  Assessment & Plan:   Principal Problem:   Hydrocephalus ([place]) Active Problems:   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 symptoms   Diabetes mellitus without complication (HCC)   Hypertension   Acute metabolic encephalopathy   Leukocytosis   GERD (gastroesophageal reflux disease)   Nasogastric tube present   S/P VP shunt  Hydrocephalus ([place]): s/p of VP shunt placement. -Management per neurosurgery   Acute metabolic encephalopathy: Likely due to postsurgical delirium.  PCCM was consulted initially, treated with Precedex, patient has been off Precedex for more than 24 hours.  Still has intermittent agitation.  Feeding tube is placed,  but the patient has started taking dysphagia diet today.  Ammonia level was normal. Remained encephalopathic. -Monitor with frequent neurochecks. -As needed Haldol -If persist for another day with no improvement-low threshold to repeat CT head.  Upper respiratory secretions.  Patient seems to have quite a bit of upper respiratory secretions with some gurgling sounds, on tube feed.  Chest appears clear.  Little weaker cough. -Chest PT -Suction secretions as needed -Aspiration precautions  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 symptoms -Proscar and Flomax   Diabetes mellitus without complication [facility]): Recent A1c 6.8, well controlled.  Patient is not taking medications currently -Sliding scale insulin   Hypertension -Clonidine patch, lisinopril, HCTZ -IV labetalol as needed   Leukocytosis: WBC 12.3>>10.8  No source of infection identified.  No fever.  Likely reactive, -Monitor for any aspiration. -Follow-up with CBC   GERD (gastroesophageal reflux disease) -Protonix  Objective: Vitals:   01/14/21 0530 01/14/21 0600 01/14/21 0809 01/14/21 1000  BP:  137/74 (!) 134/100 (!) 133/109  Pulse: 90 93  (!) 126  Resp: '20 19  16  '$ Temp:    98.8 F (37.1 C)  TempSrc:    Axillary  SpO2: 96% 97%  99%    Intake/Output Summary (Last 24 hours) at 01/14/2021 1402 Last data filed at 01/14/2021 1000 Gross per 24 hour  Intake 482.49 ml  Output 400 ml  Net 82.49 ml   There were no vitals filed for this visit.  Examination:  General exam: Chronically ill-appearing gentleman, appears comfortable Respiratory system: Clear to auscultation. Respiratory effort normal, upper respiratory gurgling sounds. Cardiovascular system: Sinus tachycardia Gastrointestinal system: Soft, nontender, nondistended, bowel sounds positive. Central  nervous system: Alert, oriented to self only, moves all extremities Extremities: No edema, no cyanosis, pulses intact and symmetrical. Psychiatry: Judgement and  insight appear impaired  Level of care: Stepdown  All the records are reviewed and case discussed with Care Management/Social Worker. Management plans discussed with the patient, nursing and they are in agreement.   Procedures:  Antimicrobials:   Data Reviewed: I have personally reviewed following labs and imaging studies  CBC: Recent Labs  Lab 01/10/21 0441 01/11/21 0535 01/12/21 0858 01/13/21 0515 01/14/21 0501  WBC 8.8 8.6 7.5 12.3* 10.8*  NEUTROABS 7.0 6.7 5.7 10.2*  --   HGB 13.8 13.9 14.6 14.9 14.3  HCT 37.9* 39.3 39.3 41.4 39.9  MCV 91.5 92.9 91.0 92.0 94.3  PLT 243 233 245 345 [id number]   Basic Metabolic Panel: Recent Labs  Lab 01/09/21 0320 01/09/21 0730 01/10/21 0441 01/11/21 0535 01/12/21 0858 01/13/21 0515 01/14/21 0501  NA 136   < > 137 136 137 140 143  K 3.9  --  3.3* 3.7 4.1 3.5 3.1*  CL 105  --  104 102 105 105 112*  CO2 26  --  '28 25 27 26 26  '$ GLUCOSE 192*  --  113* 109* 129* 127* 142*  BUN 15  --  '18 18 23 20 21  '$ CREATININE 0.62  --  0.60* 0.67 0.62 0.83 0.85  CALCIUM 8.5*  --  8.3* 8.2* 8.2* 8.6* 8.3*  MG 2.2  --  2.1 2.2  --  2.3 2.5*  PHOS 3.0  --  1.9* 2.9  --  2.6 2.8   < > = values in this interval not displayed.   GFR: Estimated Creatinine Clearance: 79.6 mL/min (by C-G formula based on SCr of 0.85 mg/dL). Liver Function Tests: Recent Labs  Lab 01/12/21 0858  AST 16  ALT 23  ALKPHOS 45  BILITOT 1.2  PROT 5.8*  ALBUMIN 2.9*   No results for input(s): LIPASE, AMYLASE in the last 168 hours. Recent Labs  Lab 01/10/21 0441 01/12/21 0858  AMMONIA 15 18   Coagulation Profile: No results for input(s): INR, PROTIME in the last 168 hours. Cardiac Enzymes: No results for input(s): CKTOTAL, CKMB, CKMBINDEX, TROPONINI in the last 168 hours. BNP (last 3 results) No results for input(s): PROBNP in the last 8760 hours. HbA1C: No results for input(s): HGBA1C in the last 72 hours. CBG: Recent Labs  Lab 01/13/21 2041 01/14/21 0018  01/14/21 0358 01/14/21 0741 01/14/21 1118  GLUCAP 162* 151* 129* 135* 137*   Lipid Profile: No results for input(s): CHOL, HDL, LDLCALC, TRIG, CHOLHDL, LDLDIRECT in the last 72 hours. Thyroid Function Tests: No results for input(s): TSH, T4TOTAL, FREET4, T3FREE, THYROIDAB in the last 72 hours. Anemia Panel: No results for input(s): VITAMINB12, FOLATE, FERRITIN, TIBC, IRON, RETICCTPCT in the last 72 hours. Sepsis Labs: No results for input(s): PROCALCITON, LATICACIDVEN in the last 168 hours.  Recent Results (from the past 240 hour(s))  MRSA Next Gen by PCR, Nasal     Status: None   Collection Time: 01/08/21  2:10 PM   Specimen: Nasal Mucosa; Nasal Swab  Result Value Ref Range Status   MRSA by PCR Next Gen NOT DETECTED NOT DETECTED Final    Comment: (NOTE) The GeneXpert MRSA Assay (FDA approved for NASAL specimens only), is one component of a comprehensive MRSA colonization surveillance program. It is not intended to diagnose MRSA infection nor to guide or monitor treatment for MRSA infections. Test performance is not FDA approved in patients  less than 2 years old. Performed at [facility], [address] [place], [place] [address]      Radiology Studies: DG Abd 1 View  Result Date: 01/13/2021 CLINICAL DATA:  75 year old male enteric tube placement. EXAM: ABDOMEN - 1 VIEW COMPARISON:  01/12/2021 and earlier. FINDINGS: Portable AP semi upright view at 0[address]12 hours. Enteric feeding tube position not significantly changed from yesterday, tube tip at the gastric antrum versus duodenal bulb. Superimposed cholelithiasis. Bowel-gas pattern remains within normal limits. Negative lung bases. Stable visualized osseous structures. IMPRESSION: Stable feeding tube positions since yesterday, tip at the gastric antrum versus duodenal bulb. Advance 5-6 cm to allow for mid duodenal placement. Electronically Signed   By: Genevie Ann M.D.   On: 01/13/2021 04:19   DG Abd 1 View  Result Date:  01/12/2021 CLINICAL DATA:  Nasogastric tube replacement. EXAM: ABDOMEN - 1 VIEW COMPARISON:  Earlier today at 2:20 p.m. FINDINGS: 5:20 p.m. Feeding tube terminates in the distal stomach or may be entering the proximal duodenum. Nonobstructive bowel gas pattern. Calcified gallstone again identified. IMPRESSION: Feeding tube either in the distal stomach or entering the proximal duodenum. Cholelithiasis. Electronically Signed   By: Abigail Miyamoto M.D.   On: 01/12/2021 17:51   DG Abd 1 View  Result Date: 01/12/2021 CLINICAL DATA:  Nasogastric tube placement. EXAM: ABDOMEN - 1 VIEW COMPARISON:  01/12/2021 at 12:14 p.m. FINDINGS: The tip of a feeding tube has now migrated into the distal aspect of the stomach. No dilated loops of bowel are seen in the included portion of the abdomen to indicate obstruction. A calcified gallstone is again noted. IMPRESSION: Feeding tube tip in the distal stomach. Electronically Signed   By: Logan Bores M.D.   On: 01/12/2021 15:00   US Abdomen Limited RUQ (LIVER/GB)  Result Date: 01/14/2021 CLINICAL DATA:  Cholelithiasis. EXAM: ULTRASOUND ABDOMEN LIMITED RIGHT UPPER QUADRANT COMPARISON:  None. FINDINGS: Gallbladder: Cholelithiasis identified. No wall thickening, pericholecystic fluid, or Murphy's sign. Common bile duct: Diameter: 5 mm Liver: Diffuse increased echogenicity in the liver. No focal mass. Portal vein is patent on color Doppler imaging with normal direction of blood flow towards the liver. Other: None. IMPRESSION: 1. Cholelithiasis.  The gallbladder is otherwise normal. 2. Diffuse increased echogenicity in the liver without focal mass is nonspecific but often due to hepatic steatosis. Electronically Signed   By: Dorise Bullion III M.D   On: 01/14/2021 09:28    Scheduled Meds:  bethanechol  10 mg Per Tube TID   Chlorhexidine Gluconate Cloth  6 each Topical Daily   [START ON 01/17/2021] cloNIDine  0.2 mg Transdermal Weekly   cyanocobalamin  1,000 mcg Intramuscular Daily    Followed by   Derrill Memo ON 01/16/2021] vitamin B-12  1,000 mcg Oral Daily   feeding supplement (NEPRO CARB STEADY)  237 mL Oral TID BM   finasteride  5 mg Oral Nightly   heparin injection (subcutaneous)  5,000 Units Subcutaneous Q8H   lisinopril  20 mg Oral Daily   And   hydrochlorothiazide  25 mg Oral Daily   insulin aspart  0-9 Units Subcutaneous Q4H   mouth rinse  15 mL Mouth Rinse BID   melatonin  10 mg Oral Nightly   multivitamin with minerals  1 tablet Oral Daily   pantoprazole (PROTONIX) IV  40 mg Intravenous Daily   QUEtiapine  50 mg Per Tube QHS   senna  1 tablet Oral BID   tamsulosin  0.4 mg Oral QPC supper   thiamine  injection  100 mg Intravenous Daily   Continuous Infusions:  sodium chloride Stopped (01/14/21 0922)   valproate sodium 55 mL/hr at 01/14/21 1000     LOS: 6 days   Time spent: 40 minutes. More than 50% of the time was spent in counseling/coordination of care  Lorella Nimrod, MD [facility]  If 7PM-7AM, please contact night-coverage [email or web address]  01/14/2021, 2:02 PM   This record has been created using Systems analyst. Errors have been sought and corrected,but may not always be located. Such creation errors do not reflect on the standard of care.

## 2021-01-15 ENCOUNTER — Inpatient Hospital Stay: Payer: Medicare Other

## 2021-01-15 DIAGNOSIS — K219 Gastro-esophageal reflux disease without esophagitis: Secondary | ICD-10-CM | POA: Diagnosis not present

## 2021-01-15 DIAGNOSIS — G919 Hydrocephalus, unspecified: Secondary | ICD-10-CM

## 2021-01-15 DIAGNOSIS — E119 Type 2 diabetes mellitus without complications: Secondary | ICD-10-CM | POA: Diagnosis not present

## 2021-01-15 DIAGNOSIS — D72829 Elevated white blood cell count, unspecified: Secondary | ICD-10-CM | POA: Diagnosis not present

## 2021-01-15 DIAGNOSIS — N400 Enlarged prostate without lower urinary tract symptoms: Secondary | ICD-10-CM | POA: Diagnosis not present

## 2021-01-15 DIAGNOSIS — Z7189 Other specified counseling: Secondary | ICD-10-CM | POA: Diagnosis not present

## 2021-01-15 LAB — GLUCOSE, CAPILLARY
Glucose-Capillary: 144 mg/dL — ABNORMAL HIGH (ref 70–99)
Glucose-Capillary: 151 mg/dL — ABNORMAL HIGH (ref 70–99)
Glucose-Capillary: 162 mg/dL — ABNORMAL HIGH (ref 70–99)
Glucose-Capillary: 219 mg/dL — ABNORMAL HIGH (ref 70–99)
Glucose-Capillary: 221 mg/dL — ABNORMAL HIGH (ref 70–99)
Glucose-Capillary: 232 mg/dL — ABNORMAL HIGH (ref 70–99)

## 2021-01-15 LAB — URINALYSIS, ROUTINE W REFLEX MICROSCOPIC
Bilirubin Urine: NEGATIVE
Glucose, UA: NEGATIVE mg/dL
Ketones, ur: 20 mg/dL — AB
Leukocytes,Ua: NEGATIVE
Nitrite: NEGATIVE
Protein, ur: NEGATIVE mg/dL
Specific Gravity, Urine: 1.02 (ref 1.005–1.030)
pH: 6 (ref 5.0–8.0)

## 2021-01-15 LAB — BASIC METABOLIC PANEL
Anion gap: 9 (ref 5–15)
BUN: 20 mg/dL (ref 8–23)
CO2: 26 mmol/L (ref 22–32)
Calcium: 8.8 mg/dL — ABNORMAL LOW (ref 8.9–10.3)
Chloride: 108 mmol/L (ref 98–111)
Creatinine, Ser: 0.76 mg/dL (ref 0.61–1.24)
GFR, Estimated: >60 mL/min (ref >60)
Glucose, Bld: 165 mg/dL — ABNORMAL HIGH (ref 70–99)
Potassium: 3.5 mmol/L (ref 3.5–5.1)
Sodium: 143 mmol/L (ref 135–145)

## 2021-01-15 LAB — CBC
HCT: 40.6 % (ref 39.0–52.0)
Hemoglobin: 14.2 g/dL (ref 13.0–17.0)
MCH: 33.6 pg (ref 26.0–34.0)
MCHC: 35 g/dL (ref 30.0–36.0)
MCV: 96.2 fL (ref 80.0–100.0)
Platelets: 288 10*3/uL (ref 150–400)
RBC: 4.22 MIL/uL (ref 4.22–5.81)
RDW: 12.5 % (ref 11.5–15.5)
WBC: 14.1 10*3/uL — ABNORMAL HIGH (ref 4.0–10.5)
nRBC: 0 % (ref 0.0–0.2)

## 2021-01-15 LAB — MAGNESIUM: Magnesium: 2.3 mg/dL (ref 1.7–2.4)

## 2021-01-15 LAB — PROCALCITONIN: Procalcitonin: <0.1 ng/mL

## 2021-01-15 LAB — PHOSPHORUS: Phosphorus: 2.8 mg/dL (ref 2.5–4.6)

## 2021-01-15 MED ORDER — FREE WATER
100.0000 mL | Status: DC
  Administered 2021-01-15 – 2021-01-19 (×23): 100 mL

## 2021-01-15 MED ORDER — LISINOPRIL 20 MG PO TABS
20.0000 mg | ORAL_TABLET | Freq: Every day | ORAL | Status: DC
  Administered 2021-01-16 – 2021-01-31 (×14): 20 mg
  Filled 2021-01-15 (×14): qty 1

## 2021-01-15 MED ORDER — SENNA 8.6 MG PO TABS
1.0000 | ORAL_TABLET | Freq: Two times a day (BID) | ORAL | Status: DC
  Administered 2021-01-15 – 2021-01-31 (×32): 8.6 mg
  Filled 2021-01-15 (×33): qty 1

## 2021-01-15 MED ORDER — PROSOURCE TF PO LIQD
45.0000 mL | Freq: Every day | ORAL | Status: DC
  Administered 2021-01-16 – 2021-01-19 (×4): 45 mL
  Filled 2021-01-15 (×6): qty 45

## 2021-01-15 MED ORDER — MELATONIN 5 MG PO TABS
10.0000 mg | ORAL_TABLET | Freq: Every day | ORAL | Status: DC
  Administered 2021-01-15 – 2021-01-30 (×16): 10 mg
  Filled 2021-01-15 (×16): qty 2

## 2021-01-15 MED ORDER — OSMOLITE 1.5 CAL PO LIQD
1000.0000 mL | ORAL | Status: DC
  Administered 2021-01-15 – 2021-01-17 (×3): 1000 mL

## 2021-01-15 MED ORDER — VITAMIN B-12 1000 MCG PO TABS
1000.0000 ug | ORAL_TABLET | Freq: Every day | ORAL | Status: DC
  Administered 2021-01-16 – 2021-01-31 (×16): 1000 ug
  Filled 2021-01-15 (×16): qty 1

## 2021-01-15 MED ORDER — ADULT MULTIVITAMIN W/MINERALS CH: 1.0000 | ORAL_TABLET | Freq: Every day | ORAL | Status: DC

## 2021-01-15 MED ORDER — HYDROCHLOROTHIAZIDE 25 MG PO TABS
25.0000 mg | ORAL_TABLET | Freq: Every day | ORAL | Status: DC
  Administered 2021-01-16 – 2021-01-31 (×14): 25 mg
  Filled 2021-01-15 (×14): qty 1

## 2021-01-15 NOTE — Progress Notes (Signed)
Patient has made no attempts to pull on NG tube or PIV's. 1:1 sitter order expired and no immediate need for 1:1 safety sitter at this time. Will continue to monitor and assess.

## 2021-01-15 NOTE — Progress Notes (Signed)
Daily Progress Note   Patient Name: Eric [name]       Date: 01/15/2021 DOB: 12-Nov-1945  Age: 75 y.o. MRN#: [id number] Attending Physician: Meade Maw, MD Primary Care Physician: Ileana Roup, MD Admit Date: 01/08/2021  Reason for Consultation/Follow-up: Establishing goals of care  Subjective: Patient is resting in bed with eyes closed- DHT in place. Wife is at bedside. She discusses concerns that yesterday he was very sleepy and lethargic. Discussed his coughing with eating and drinking, and his current NPO status for safety. Discussed hopes for his mental status to improve. She discusses patient was driving prior to this hospitalization and had very mild forgetfulness at times, but a good and independent QOL. Will continue to follow.   If patient should become agitated and combative again, I do not recommend any further Seroquel use, and Haldol only if needed for symptoms not treated with oral medications. Would recommend Risperdal or Zyprexa use for this gentleman (dosing recommendations are available in previous consult note from 7/29).     Length of Stay: 7  Current Medications: Scheduled Meds:   bethanechol  10 mg Per Tube TID   Chlorhexidine Gluconate Cloth  6 each Topical Daily   [START ON 01/17/2021] cloNIDine  0.2 mg Transdermal Weekly   feeding supplement (NEPRO CARB STEADY)  237 mL Oral TID BM   finasteride  5 mg Oral Nightly   heparin injection (subcutaneous)  5,000 Units Subcutaneous Q8H   lisinopril  20 mg Oral Daily   And   hydrochlorothiazide  25 mg Oral Daily   insulin aspart  0-9 Units Subcutaneous Q4H   mouth rinse  15 mL Mouth Rinse BID   melatonin  10 mg Oral Nightly   multivitamin with minerals  1 tablet Oral Daily   pantoprazole (PROTONIX) IV  40 mg  Intravenous Daily   senna  1 tablet Oral BID   tamsulosin  0.4 mg Oral QPC supper   thiamine injection  100 mg Intravenous Daily   [START ON 01/16/2021] vitamin B-12  1,000 mcg Oral Daily    Continuous Infusions:  sodium chloride 5 mL/hr at 01/15/21 0600   valproate sodium 500 mg (01/15/21 0802)    PRN Meds: sodium chloride, acetaminophen **OR** acetaminophen, bisacodyl, bismuth subsalicylate, haloperidol lactate, hydrALAZINE, labetalol, ondansetron **OR** ondansetron (ZOFRAN) IV  Physical Exam Constitutional:  Comments: Eyes closed. DHT in place.   Pulmonary:     Effort: Pulmonary effort is normal.            Vital Signs: BP (!) 152/80   Pulse (!) 112   Temp 98.2 F (36.8 C) (Oral)   Resp (!) 25   SpO2 96%  SpO2: SpO2: 96 % O2 Device: O2 Device: Room Air O2 Flow Rate: O2 Flow Rate (L/min): 2 L/min  Intake/output summary:  Intake/Output Summary (Last 24 hours) at 01/15/2021 1030 Last data filed at 01/15/2021 0600 Gross per 24 hour  Intake 415.32 ml  Output 950 ml  Net -534.68 ml   LBM: Last BM Date: 01/14/21 Baseline Weight:   Most recent weight:           Patient Active Problem List   Diagnosis Date Noted   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 symptoms    Diabetes mellitus without complication (HCC)    Hypertension    Acute metabolic encephalopathy    Leukocytosis    GERD (gastroesophageal reflux disease)    Nasogastric tube present    S/P VP shunt    Hydrocephalus (HCC) 01/08/2021    Palliative Care Assessment & Plan    Recommendations/Plan: Full code, full scope.  If patient should become agitated and combative again, I do not recommend any further Seroquel use, and Haldol only if needed for symptoms not treated with oral medications. Would recommend Risperdal or Zyprexa use for this gentleman (dosing recommendations are available in previous consult note from 7/29).       Code Status:    Code Status Orders  (From admission, onward)            Start     Ordered   01/08/21 1350  Full code  Continuous        01/08/21 1349           Code Status History     This patient has a current code status but no historical code status.      Care plan was discussed with RN  Thank you for allowing the Palliative Medicine Team to assist in the care of this patient.       Total Time 35 min Prolonged Time Billed  no       Greater than 50%  of this time was spent counseling and coordinating care related to the above assessment and plan.  Asencion Gowda, NP  Please contact Palliative Medicine Team phone at [phone] for questions and concerns.

## 2021-01-15 NOTE — Progress Notes (Signed)
Physical Therapy Treatment Patient Details Name: Eric [name] MRN: [id number] DOB: 1945-09-18 Today's Date: 01/15/2021    History of Present Illness Patient is a 75 y.o. male with Hydrocephalus status post elective Right Frontal VP Shunt insertion.  Post-op with acute delirium/agitation felt to be due to complication of anesthesia plus Hyponatremia requiring Precedex drip.    PT Comments    Patient remains lethargic but is arousal and able to maintain alertness for participation with PT with stimulation.  Patient continues to require assistance for bed mobility with poor sitting balance. Min A initially required with sitting that progressed to close stand by assistance with increased sitting time. Patient sat up for approximately 5 minutes and became fatigued, unable to progress transfers this session. No significant change with vitals noted with activity. Recommend to continue PT to maximize independence and decrease caregiver burden.    Follow Up Recommendations  SNF     Equipment Recommendations  Rolling walker with 5" wheels    Recommendations for Other Services       Precautions / Restrictions Precautions Precautions: Fall Restrictions Weight Bearing Restrictions: No    Mobility  Bed Mobility Overal bed mobility: Needs Assistance Bed Mobility: Supine to Sit;Sit to Supine     Supine to sit: Mod assist Sit to supine: Mod assist   General bed mobility comments: assistance for LE and trunk support. verbal cues for sequencing and technique. patient has eyes opened during mobility efforts.    Transfers                 General transfer comment: unable to progress transfers this session due to lethargy and fatigue with minimal activity. no significant change in vitals with seated level activity  Ambulation/Gait                 Stairs             Wheelchair Mobility    Modified Rankin (Stroke Patients Only)       Balance Overall balance  assessment: Needs assistance Sitting-balance support: Feet supported Sitting balance-Leahy Scale: Poor Sitting balance - Comments: Min A required for seated level activit. faciliation for midline posture as patient with posterior loss of balance                                    Cognition Arousal/Alertness: Lethargic Behavior During Therapy: Flat affect Overall Cognitive Status: Impaired/Different from baseline                                 General Comments: patient is lethargic throughout session (spouse thinks due to previous medications). patient with increased alertness with mobility efforts. able to follow single step commands with extra time. patient oriented to person and place.      Exercises      General Comments        Pertinent Vitals/Pain Pain Assessment: No/denies pain    Home Living                      Prior Function            PT Goals (current goals can now be found in the care plan section) Acute Rehab PT Goals Patient Stated Goal: none stated PT Goal Formulation: With patient Time For Goal Achievement: 01/25/21 Potential to Achieve Goals: Good Progress towards PT  goals: Progressing toward goals    Frequency    7X/week      PT Plan Current plan remains appropriate    Co-evaluation              AM-PAC PT "6 Clicks" Mobility   Outcome Measure  Help needed turning from your back to your side while in a flat bed without using bedrails?: A Little Help needed moving from lying on your back to sitting on the side of a flat bed without using bedrails?: A Lot Help needed moving to and from a bed to a chair (including a wheelchair)?: A Lot Help needed standing up from a chair using your arms (e.g., wheelchair or bedside chair)?: A Lot Help needed to walk in hospital room?: A Lot Help needed climbing 3-5 steps with a railing? : Total 6 Click Score: 12    End of Session   Activity Tolerance: Patient  limited by fatigue Patient left: in bed;with call bell/phone within reach;with family/visitor present Nurse Communication: Mobility status PT Visit Diagnosis: Unsteadiness on feet (R26.81);Muscle weakness (generalized) (M62.81);Other abnormalities of gait and mobility (R26.89)     Time: [id number] PT Time Calculation (min) (ACUTE ONLY): 24 min  Charges:  $Therapeutic Activity: 23-37 mins                     Minna Merritts, PT, MPT    Percell Locus 01/15/2021, 12:52 PM

## 2021-01-15 NOTE — Progress Notes (Signed)
  Speech Language Pathology Treatment: Dysphagia  Patient Details Name: Eric [name] MRN: [id number] DOB: Sep 17, 1945 Today's Date: 01/15/2021 Time: 0837-0900 SLP Time Calculation (min) (ACUTE ONLY): 23 min  Assessment / Plan / Recommendation Clinical Impression  [name] alerted SLP of pt's increased congestion and inability to maintain alertness for safe PO intake. Pt's wife was at bedside. Pt continued with lethargy and aroused for brief moments with unintelligible speech. At rest, pt's RR was 25 which is increased over previous session on Saturday, currently has fever as well as a wet cough that appears progressively worse than observed during previous sessions. Education provided to pt's wife on new recommendation of NPO, concern for malnutrition and dehydration. Asked Palliative Care to revisit with pt's wife. All questions answered to this writer's ability. MD made aware of current concerns and NPO status.   ST will follow for PO readiness.     HPI HPI: Eric [name] is a 75 y.o. 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 HE is status post elective Right Frontal VP Shunt insertion.  Post-op with acute delirium/agitation felt to be due to complication of anesthesia requiring Precedex drip. 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      SLP Plan  Continue with current plan of care       Recommendations  Diet recommendations: NPO Medication Administration: Via alternative means                Oral Care Recommendations: Oral care QID Follow up Recommendations:  (TBD, Palliative Care following) SLP Visit Diagnosis: Dysphagia, unspecified (R13.10) Plan: Continue with  current plan of care       GO               Eric [name] B. Rutherford Nail M.S., CCC-SLP, [place] Office [phone]  Eric [name] 01/15/2021, 2:18 PM

## 2021-01-15 NOTE — Progress Notes (Signed)
Initial Nutrition Assessment  DOCUMENTATION CODES:   Not applicable  INTERVENTION:   Osmolite 1.5 '@60ml'$ /hr + Pro-Source TF 84m daily via tube   Free water flushes 1070mq4 hours   Regimen provides 2200kcal/day, 101g/day protein and 169767may free water   Pt at high refeed risk; recommend monitor potassium, magnesium and phosphorus labs daily until stable  NUTRITION DIAGNOSIS:   Inadequate oral intake related to acute illness as evidenced by NPO status.  GOAL:   Patient will meet greater than or equal to 90% of their needs  MONITOR:   Diet advancement, Labs, Weight trends, Skin, TF tolerance, I & O's  REASON FOR ASSESSMENT:   Consult Enteral/tube feeding initiation and management  ASSESSMENT:   75 36o. male with PMH of hypertension, hyperlipidemia, diabetes mellitus, GERD, depression, anxiety, BPH and hydrocephalus who is admitted by neurosurgery for VP shunt placement [id number] by ongoing delerium  Pt seen by SLP on 7/30 and initiated on a dysphagia 1/nectar thick diet. Pt ate 90% of his breakfast and lunch yesterday but kept spitting out his diner. Pt with concerns for aspiration today; pt coughing with food and drink. SLP re-evaluated pt today and made pt NPO. NGT in place for medications; will plan to initiate tube feeds today. Pt is likely at refeed risk. No new weight this admission; will request daily weights.   Medications reviewed and include: heparin, insulin, melatonin, protonix, senokot, thiamine   Labs reviewed: K 3.5 wnl, P 2.8 wnl, Mg 2.3 wnl Wbc- 14.1(H) Cbgs- 151, 144, 162 x 24 hrs AIC 6.8(H)- 7/26  NUTRITION - FOCUSED PHYSICAL EXAM: Unable to perform at this time- pt unavailable at time of RD visit  Diet Order:   Diet Order             Diet NPO time specified  Diet effective now                  EDUCATION NEEDS:   No education needs have been identified at this time  Skin:  Skin Assessment: Reviewed RN Assessment (incision  head, abdomen and neck)  Last BM:  7/31- type 6  Height:   Ht Readings from Last 1 Encounters:  01/01/21 '5\' 8"'$  (1.727 m)    Weight:   Wt Readings from Last 1 Encounters:  01/01/21 87.7 kg    Ideal Body Weight:  70 kg  Estimated Nutritional Needs:   Kcal:  1900-2200kcal/day  Protein:  95-110g/day  Fluid:  1.8-2.1L/day  CasKoleen Distance, RD, LDN Please [facility] to [facility] and/or RD on-call/weekend/after hours pager

## 2021-01-15 NOTE — TOC Progression Note (Signed)
Transition of Care [facility]) - Progression Note    Patient Details  Name: Eric [name] MRN: [id number] Date of Birth: [birth year]  Transition of Care [facility] CM/SW [place], RN Phone Number: 01/15/2021, 1:53 PM  Clinical Narrative:   Bonne Dolores calling patients' wife, [name] to discuss PT/OT recommendations no answer left voice message to return call.         Expected Discharge Plan and Services                                                 Social Determinants of Health (SDOH) Interventions    Readmission Risk Interventions No flowsheet data found.

## 2021-01-15 NOTE — Progress Notes (Signed)
PROGRESS NOTE  Consult-progress note  Eric [name]  [id number] DOB: [birth year] DOA: 01/08/2021 PCP: Ileana Roup, MD   Brief Narrative: Taken from consult note. Eric [name] is an 75 y.o. male with PMH of hypertension, hyperlipidemia, diabetes mellitus, GERD, depression, anxiety, BPH, hydrocephalus, who is admitted by neurosurgery for VP shunt placement for hydrocephalus. We are asked to to consult for management of chronic medical issues and altered mental status/delirium.   Pt is POD #6 from right frontal VP shunt placement for hydrocephalus. After the surgery, pt developed confusion, agitation, suspecting posterior surgery delirium.  PCCM was consulted.  Patient was initially treated with Precedex.  He has been off Precedex for more than 24 hours.  Patient is currently on tube feeding.  Per his wife at the bedside, at his normal baseline, patient is alert and orientated x3.  Currently patient knows his own name, but is not orientated to the place and time. He moves all extremities. 8/1: Little more alert but remains lethargic.  Subjective: Patient was seen and examined today.  Remained lethargic and little somnolent although little better than yesterday.  Wife at bedside.  He only received melatonin last night.  No other sedating medications were given.  Assessment & Plan:   Principal Problem:   Hydrocephalus ([place]) Active Problems:   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 symptoms   Diabetes mellitus without complication (HCC)   Hypertension   Acute metabolic encephalopathy   Leukocytosis   GERD (gastroesophageal reflux disease)   Nasogastric tube present   S/P VP shunt  Hydrocephalus ([place]): s/p of VP shunt placement. -Management per neurosurgery   Acute metabolic encephalopathy: Likely due to postsurgical delirium.  PCCM was consulted initially, treated with Precedex, patient has been off Precedex for more than 24 hours.  Still has intermittent agitation.  Feeding  tube is placed, currently on tube feed. Ammonia level was normal. Remained encephalopathic. -Monitor with frequent neurochecks. -As needed Haldol -If persist for another day with no improvement-low threshold to repeat CT head.  Upper respiratory secretions.  Seems improving -Continue chest PT -Suction secretions as needed -Aspiration precautions  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 symptoms -Proscar and Flomax   Diabetes mellitus without complication ([place]): Recent A1c 6.8, well controlled.  Patient is not taking medications currently -Sliding scale insulin   Hypertension -Clonidine patch, lisinopril, HCTZ -IV labetalol as needed   Leukocytosis: WBC 12.3>>10.8  No source of infection identified.  No fever.  Likely reactive, procalcitonin negative.  UA is not very impressive. -Follow-up blood and urine cultures. -Monitor for any aspiration. -Follow-up with CBC   GERD (gastroesophageal reflux disease) -Protonix  Objective: Vitals:   01/15/21 0800 01/15/21 0801 01/15/21 1000 01/15/21 1200  BP: (!) 141/92 (!) 141/92 (!) 152/80 129/77  Pulse: (!) 108  (!) 112 (!) 103  Resp: (!) 21  (!) 25 (!) 21  Temp: 98.2 F (36.8 C)   98.6 F (37 C)  TempSrc: Oral   Oral  SpO2: 97%  96% 99%    Intake/Output Summary (Last 24 hours) at 01/15/2021 1530 Last data filed at 01/15/2021 1200 Gross per 24 hour  Intake 398.31 ml  Output 950 ml  Net -551.69 ml    There were no vitals filed for this visit.  Examination:  General.  Chronically ill-appearing gentleman, in no acute distress. Pulmonary.  Lungs clear bilaterally, normal respiratory effort. CV.  Regular rate and rhythm, no JVD, rub or murmur. Abdomen.  Soft, nontender, nondistended, BS positive. CNS.  Somnolent, oriented to self  only, following simple commands. Extremities.  No edema, no cyanosis, pulses intact and symmetrical. Psychiatry.  Judgment and insight appears impaired.  Level of care: Stepdown  All the  records are reviewed and case discussed with Care Management/Social Worker. Management plans discussed with the patient, nursing and they are in agreement.   Procedures:  Antimicrobials:   Data Reviewed: I have personally reviewed following labs and imaging studies  CBC: Recent Labs  Lab 01/10/21 0441 01/11/21 0535 01/12/21 0858 01/13/21 0515 01/14/21 0501 01/15/21 0441  WBC 8.8 8.6 7.5 12.3* 10.8* 14.1*  NEUTROABS 7.0 6.7 5.7 10.2*  --   --   HGB 13.8 13.9 14.6 14.9 14.3 14.2  HCT 37.9* 39.3 39.3 41.4 39.9 40.6  MCV 91.5 92.9 91.0 92.0 94.3 96.2  PLT 243 233 245 345 290 [id number]    Basic Metabolic Panel: Recent Labs  Lab 01/10/21 0441 01/11/21 0535 01/12/21 0858 01/13/21 0515 01/14/21 0501 01/15/21 0441  NA 137 136 137 140 143 143  K 3.3* 3.7 4.1 3.5 3.1* 3.5  CL 104 102 105 105 112* 108  CO2 '28 25 27 26 26 26  '$ GLUCOSE 113* 109* 129* 127* 142* 165*  BUN '18 18 23 20 21 20  '$ CREATININE 0.60* 0.67 0.62 0.83 0.85 0.76  CALCIUM 8.3* 8.2* 8.2* 8.6* 8.3* 8.8*  MG 2.1 2.2  --  2.3 2.5* 2.3  PHOS 1.9* 2.9  --  2.6 2.8 2.8    GFR: CrCl cannot be calculated (Unknown ideal weight.). Liver Function Tests: Recent Labs  Lab 01/12/21 0858  AST 16  ALT 23  ALKPHOS 45  BILITOT 1.2  PROT 5.8*  ALBUMIN 2.9*    No results for input(s): LIPASE, AMYLASE in the last 168 hours. Recent Labs  Lab 01/10/21 0441 01/12/21 0858  AMMONIA 15 18    Coagulation Profile: No results for input(s): INR, PROTIME in the last 168 hours. Cardiac Enzymes: No results for input(s): CKTOTAL, CKMB, CKMBINDEX, TROPONINI in the last 168 hours. BNP (last 3 results) No results for input(s): PROBNP in the last 8760 hours. HbA1C: No results for input(s): HGBA1C in the last 72 hours. CBG: Recent Labs  Lab 01/14/21 2114 01/14/21 2328 01/15/21 0351 01/15/21 0722 01/15/21 1148  GLUCAP 159* 134* 151* 144* 162*    Lipid Profile: No results for input(s): CHOL, HDL, LDLCALC, TRIG, CHOLHDL,  LDLDIRECT in the last 72 hours. Thyroid Function Tests: No results for input(s): TSH, T4TOTAL, FREET4, T3FREE, THYROIDAB in the last 72 hours. Anemia Panel: No results for input(s): VITAMINB12, FOLATE, FERRITIN, TIBC, IRON, RETICCTPCT in the last 72 hours. Sepsis Labs: Recent Labs  Lab 01/15/21 0441  PROCALCITON <0.10    Recent Results (from the past 240 hour(s))  MRSA Next Gen by PCR, Nasal     Status: None   Collection Time: 01/08/21  2:10 PM   Specimen: Nasal Mucosa; Nasal Swab  Result Value Ref Range Status   MRSA by PCR Next Gen NOT DETECTED NOT DETECTED Final    Comment: (NOTE) The GeneXpert MRSA Assay (FDA approved for NASAL specimens only), is one component of a comprehensive MRSA colonization surveillance program. It is not intended to diagnose MRSA infection nor to guide or monitor treatment for MRSA infections. Test performance is not FDA approved in patients less than 22 years old. Performed at [facility], [address] [place], [place] [address]       Radiology Studies: [facility] Chest [place] 1 View  Result Date: 01/15/2021 CLINICAL DATA:  Fever EXAM: PORTABLE  CHEST 1 VIEW COMPARISON:  01/08/2021 FINDINGS: Heart and mediastinal contours are within normal limits. No focal opacities or effusions. No acute bony abnormality. IMPRESSION: No active disease. Electronically Signed   By: Rolm Baptise M.D.   On: 01/15/2021 08:38   US Abdomen Limited RUQ (LIVER/GB)  Result Date: 01/14/2021 CLINICAL DATA:  Cholelithiasis. EXAM: ULTRASOUND ABDOMEN LIMITED RIGHT UPPER QUADRANT COMPARISON:  None. FINDINGS: Gallbladder: Cholelithiasis identified. No wall thickening, pericholecystic fluid, or Murphy's sign. Common bile duct: Diameter: 5 mm Liver: Diffuse increased echogenicity in the liver. No focal mass. Portal vein is patent on color Doppler imaging with normal direction of blood flow towards the liver. Other: None. IMPRESSION: 1. Cholelithiasis.  The gallbladder is otherwise  normal. 2. Diffuse increased echogenicity in the liver without focal mass is nonspecific but often due to hepatic steatosis. Electronically Signed   By: Dorise Bullion III M.D   On: 01/14/2021 09:28    Scheduled Meds:  bethanechol  10 mg Per Tube TID   Chlorhexidine Gluconate Cloth  6 each Topical Daily   [START ON 01/17/2021] cloNIDine  0.2 mg Transdermal Weekly   [START ON 01/16/2021] feeding supplement (PROSource TF)  45 mL Per Tube Daily   finasteride  5 mg Oral Nightly   free water  100 mL Per Tube Q4H   heparin injection (subcutaneous)  5,000 Units Subcutaneous Q8H   [START ON 01/16/2021] hydrochlorothiazide  25 mg Per Tube Daily   And   [START ON 01/16/2021] lisinopril  20 mg Per Tube Daily   insulin aspart  0-9 Units Subcutaneous Q4H   mouth rinse  15 mL Mouth Rinse BID   melatonin  10 mg Per Tube QHS   pantoprazole (PROTONIX) IV  40 mg Intravenous Daily   senna  1 tablet Per Tube BID   tamsulosin  0.4 mg Oral QPC supper   thiamine injection  100 mg Intravenous Daily   [START ON 01/16/2021] vitamin B-12  1,000 mcg Per Tube Daily   Continuous Infusions:  sodium chloride 5 mL/hr at 01/15/21 1200   feeding supplement (OSMOLITE 1.5 CAL) 1,000 mL (01/15/21 1251)   valproate sodium Stopped (01/15/21 0902)     LOS: 7 days   Time spent: 40 minutes. More than 50% of the time was spent in counseling/coordination of care  Lorella Nimrod, MD [facility]  If 7PM-7AM, please contact night-coverage [email or web address]  01/15/2021, 3:30 PM   This record has been created using Systems analyst. Errors have been sought and corrected,but may not always be located. Such creation errors do not reflect on the standard of care.

## 2021-01-15 NOTE — Progress Notes (Signed)
   Progress Note  History: Eric [name] is POD#7 from right frontal VP shunt placement for hydrocephalus. He has dementia at baseline which was exacerbated by anesthesia from his recent surgery.  Has been off of sedation for > 24hours but drowsy overnight. He has a Dobhoff and has start tube feeding.    Physical Exam: Vitals:   01/15/21 0400 01/15/21 0600  BP: (!) 156/75 114/63  Pulse: (!) 117 99  Resp: (!) 24 20  Temp:    SpO2: 98% 98%    Drowsy but arousable. Oriented to person and year Following simple one step commands.  PERRL Incisions c/d/I   Data:  Recent Labs  Lab 01/13/21 0515 01/14/21 0501 01/15/21 0441  NA 140 143 143  K 3.5 3.1* 3.5  CL 105 112* 108  CO2 '26 26 26  '$ BUN '20 21 20  '$ CREATININE 0.83 0.85 0.76  GLUCOSE 127* 142* 165*  CALCIUM 8.6* 8.3* 8.8*    Recent Labs  Lab 01/12/21 0858  AST 16  ALT 23  ALKPHOS 45      Recent Labs  Lab 01/13/21 0515 01/14/21 0501 01/15/21 0441  WBC 12.3* 10.8* 14.1*  HGB 14.9 14.3 14.2  HCT 41.4 39.9 40.6  PLT 345 290 288    No results for input(s): APTT, INR in the last 168 hours.       Other tests/results: Stunt series completed post-op without noted complication.   CT head 01/10/21 showed good placement of catheter without any hemorrhage  Assessment/Plan:  Eric [name]  is a 75 y.o male s/p right frontal VP shunt. He is improving from post-operative delirium.  - DVT prophylaxis (On Heparin 5,000 q8) - PTOT  - Will order chest xray, blood and urine cultures given low grade fever, leukocytolysis, and tachycardia.   - Appreciate medicine and neurology assistance.  - Likely transfer out of unit - will request medical assistance - start tube feeding today per ICU  Cooper Render PA-C Neurosurgery

## 2021-01-16 ENCOUNTER — Inpatient Hospital Stay: Payer: Medicare Other

## 2021-01-16 DIAGNOSIS — K219 Gastro-esophageal reflux disease without esophagitis: Secondary | ICD-10-CM | POA: Diagnosis not present

## 2021-01-16 DIAGNOSIS — Z7189 Other specified counseling: Secondary | ICD-10-CM | POA: Diagnosis not present

## 2021-01-16 DIAGNOSIS — D72829 Elevated white blood cell count, unspecified: Secondary | ICD-10-CM | POA: Diagnosis not present

## 2021-01-16 DIAGNOSIS — E119 Type 2 diabetes mellitus without complications: Secondary | ICD-10-CM | POA: Diagnosis not present

## 2021-01-16 DIAGNOSIS — N400 Enlarged prostate without lower urinary tract symptoms: Secondary | ICD-10-CM | POA: Diagnosis not present

## 2021-01-16 DIAGNOSIS — G919 Hydrocephalus, unspecified: Secondary | ICD-10-CM | POA: Diagnosis not present

## 2021-01-16 LAB — BASIC METABOLIC PANEL
Anion gap: 7 (ref 5–15)
BUN: 26 mg/dL — ABNORMAL HIGH (ref 8–23)
CO2: 29 mmol/L (ref 22–32)
Calcium: 8.7 mg/dL — ABNORMAL LOW (ref 8.9–10.3)
Chloride: 105 mmol/L (ref 98–111)
Creatinine, Ser: 0.76 mg/dL (ref 0.61–1.24)
GFR, Estimated: >60 mL/min (ref >60)
Glucose, Bld: 229 mg/dL — ABNORMAL HIGH (ref 70–99)
Potassium: 3.2 mmol/L — ABNORMAL LOW (ref 3.5–5.1)
Sodium: 141 mmol/L (ref 135–145)

## 2021-01-16 LAB — GLUCOSE, CAPILLARY
Glucose-Capillary: 233 mg/dL — ABNORMAL HIGH (ref 70–99)
Glucose-Capillary: 230 mg/dL — ABNORMAL HIGH (ref 70–99)
Glucose-Capillary: 245 mg/dL — ABNORMAL HIGH (ref 70–99)
Glucose-Capillary: 256 mg/dL — ABNORMAL HIGH (ref 70–99)
Glucose-Capillary: 214 mg/dL — ABNORMAL HIGH (ref 70–99)

## 2021-01-16 LAB — PHOSPHORUS: Phosphorus: 2.3 mg/dL — ABNORMAL LOW (ref 2.5–4.6)

## 2021-01-16 LAB — PROCALCITONIN: Procalcitonin: <0.1 ng/mL

## 2021-01-16 LAB — MAGNESIUM: Magnesium: 2.3 mg/dL (ref 1.7–2.4)

## 2021-01-16 MED ORDER — POTASSIUM CHLORIDE 20 MEQ PO PACK
40.0000 meq | PACK | Freq: Once | ORAL | Status: AC
  Administered 2021-01-16: 40 meq
  Filled 2021-01-16: qty 2

## 2021-01-16 NOTE — Progress Notes (Signed)
  Speech Language Pathology Treatment: Dysphagia  Patient Details Name: Eric [name] MRN: [id number] DOB: Apr 12, 1946 Today's Date: 01/16/2021 Time: [id number] SLP Time Calculation (min) (ACUTE ONLY): 22 min  Assessment / Plan / Recommendation Clinical Impression  Pt seen for ongoing dysphagia management. Pt was fusing and irritated d/t mittens, and being directed to stay in bed. Pt largely alert but not answering any questions. Ice chip placed at pt's lips with oral response and verbal response observed. When consuming ice chips, pt appeared to have a delayed swallow response with immediate and prolonged coughing. This could be in part to the ice chips thinning out any pharyngeal secretions. Given the severity of pt's coughing, recommend NPO at this time.      HPI HPI: Eric [name] is a 75 y.o. 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 HE is status post elective Right Frontal VP Shunt insertion.  Post-op with acute delirium/agitation felt to be due to complication of anesthesia requiring Precedex drip. 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      SLP Plan  Continue with current plan of care       Recommendations  Diet recommendations: Nectar-thick liquid;Dysphagia 1 (puree) Liquids provided via: Teaspoon Medication Administration: Via alternative means Supervision: Full supervision/cueing for compensatory strategies Compensations: Minimize environmental distractions;Slow rate;Small sips/bites Postural Changes and/or Swallow Maneuvers: Seated upright 90 degrees;Upright 30-60 min after meal                Oral Care Recommendations: Oral care QID Follow up  Recommendations: Skilled Nursing facility SLP Visit Diagnosis: Dysphagia, oropharyngeal phase (R13.12) Plan: Continue with current plan of care       GO               Keygan Dumond B. Rutherford Nail M.S., CCC-SLP, [place] Office [phone]  Stormy Fabian 01/16/2021, 1:52 PM

## 2021-01-16 NOTE — Progress Notes (Signed)
   Progress Note  History: Eric [name] is POD#8 from right frontal VP shunt placement for hydrocephalus. He has dementia at baseline which was exacerbated by anesthesia from his recent surgery.    He continued to have some drowsiness yesterday.  Infectious work-up did not reveal any obvious etiology for an infection.  He does continue on the tube feeds.  Medicine team continues to follow.   Physical Exam: Vitals:   01/16/21 0756 01/16/21 0800  BP: 115/71 (!) 142/69  Pulse:  90  Resp:  (!) 21  Temp:  98.9 F (37.2 C)  SpO2:  96%    Drowsy but arousable. Oriented to person and year Following commands in all extremities Strength appears full in upper and lower extremities Right frontal incision is clean and dry Abdomen soft  Data:  Recent Labs  Lab 01/14/21 0501 01/15/21 0441 01/16/21 0356  NA 143 143 141  K 3.1* 3.5 3.2*  CL 112* 108 105  CO2 '26 26 29  '$ BUN 21 20 26*  CREATININE 0.85 0.76 0.76  GLUCOSE 142* 165* 229*  CALCIUM 8.3* 8.8* 8.7*   Recent Labs  Lab 01/12/21 0858  AST 16  ALT 23  ALKPHOS 45     Recent Labs  Lab 01/13/21 0515 01/14/21 0501 01/15/21 0441  WBC 12.3* 10.8* 14.1*  HGB 14.9 14.3 14.2  HCT 41.4 39.9 40.6  PLT 345 290 288   No results for input(s): APTT, INR in the last 168 hours.       Other tests/results: Stunt series completed post-op without noted complication.   CT head 01/10/21 showed good placement of catheter without any hemorrhage  Assessment/Plan:  Eric [name]  is a 75 y.o male s/p right frontal VP shunt. He is improving from post-operative delirium.  - DVT prophylaxis (On Heparin 5,000 q8) - PTOT when able - Appreciate medicine and neurology assistance.  - Likely transfer out of unit - will request medical assistance -Continue tube feeds, will need to determine feeding plan going forward with speech therapy -Patient will likely need placement on discharge from the hospital  Deetta Perla, MD Neurosurgery

## 2021-01-16 NOTE — Progress Notes (Signed)
PROGRESS NOTE  Consult-progress note  Eric [name]  [id number] DOB: [birth year] DOA: 01/08/2021 PCP: Ileana Roup, MD   Brief Narrative: Taken from consult note. Eric [name] is an 75 y.o. male with PMH of hypertension, hyperlipidemia, diabetes mellitus, GERD, depression, anxiety, BPH, hydrocephalus, who is admitted by neurosurgery for VP shunt placement for hydrocephalus. We are asked to to consult for management of chronic medical issues and altered mental status/delirium.   Pt is POD #6 from right frontal VP shunt placement for hydrocephalus. After the surgery, pt developed confusion, agitation, suspecting posterior surgery delirium.  PCCM was consulted.  Patient was initially treated with Precedex.  He has been off Precedex for more than 24 hours.  Patient is currently on tube feeding.  Per his wife at the bedside, at his normal baseline, patient is alert and orientated x3.  Currently patient knows his own name, but is not orientated to the place and time. He moves all extremities. 8/1: Little more alert but remains lethargic. 8/2: Initial swallow evaluation with increased risk of aspiration, he was able to take some applesauce and nectar thick with swallow team today.  Remained agitated, a lot of behavioral issues. Repeat CT head was without any new abnormality.  Subjective: Patient was seen and examined today.  Appears little agitated.  He was refusing to answer questions deliberately.  Per wife he is frustrated and wants to go home.  More alert today.  Assessment & Plan:   Principal Problem:   Hydrocephalus ([place]) Active Problems:   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 symptoms   Diabetes mellitus without complication (HCC)   Hypertension   Acute metabolic encephalopathy   Leukocytosis   GERD (gastroesophageal reflux disease)   Nasogastric tube present   S/P VP shunt  Hydrocephalus ([place]): s/p of VP shunt placement. -Management per neurosurgery -Repeat CT head  without any new abnormality.  Acute metabolic encephalopathy: Likely due to postsurgical delirium.  PCCM was consulted initially, treated with Precedex, patient has been off Precedex for more than 24 hours.  Still has intermittent agitation.  Feeding tube is placed, currently on tube feed. Ammonia level was normal. Remained encephalopathic-little more alert and appears agitated. -Monitor with frequent neurochecks. -No new abnormality noted on repeat CT head today. -Able to swallow some with swallow team.  Upper respiratory secretions.  Seems improving -Continue chest PT -Suction secretions as needed -Aspiration precautions  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 symptoms -Proscar and Flomax   Diabetes mellitus without complication ([place]): Recent A1c 6.8, well controlled.  Patient is not taking medications currently -Sliding scale insulin   Hypertension -Clonidine patch, lisinopril, HCTZ -IV labetalol as needed   Leukocytosis: WBC 12.3>>10.8  No source of infection identified.  No fever.  Likely reactive, procalcitonin negative.  UA is not very impressive.  Blood cultures remain negative. -Monitor for any aspiration. -Follow-up with CBC   GERD (gastroesophageal reflux disease) -Protonix  Electrolyte abnormalities.  Found to have mild hypophosphatemia and hypokalemia. -Replete electrolytes as needed and monitor.  Objective: Vitals:   01/16/21 1200 01/16/21 1300 01/16/21 1400 01/16/21 1500  BP: (!) 159/135  (!) 142/66   Pulse: 100 (!) 104 (!) 109 89  Resp: 19 (!) 21 12 (!) 24  Temp: 99.1 F (37.3 C)     TempSrc: Axillary     SpO2: 94% 95% 93% 95%  Weight:      Height:        Intake/Output Summary (Last 24 hours) at 01/16/2021 1529 Last data filed at 01/16/2021 1454  Gross per 24 hour  Intake 2092.81 ml  Output 1325 ml  Net 767.81 ml    Filed Weights   01/16/21 0428 01/16/21 0800  Weight: 79.8 kg 79.8 kg    Examination: General.  Chronically ill-appearing,  little agitated elderly man in no acute distress. Pulmonary.  Lungs clear bilaterally, normal respiratory effort. CV.  Regular rate and rhythm, no JVD, rub or murmur. Abdomen.  Soft, nontender, nondistended, BS positive. CNS.  Alert but not answering any questions, no apparent focal deficit Extremities.  No edema, no cyanosis, pulses intact and symmetrical. Psychiatry.  Judgment and insight appears impaired.  Level of care: Progressive Cardiac  All the records are reviewed and case discussed with Care Management/Social Worker. Management plans discussed with the patient, nursing and they are in agreement.   Procedures:  Antimicrobials:   Data Reviewed: I have personally reviewed following labs and imaging studies  CBC: Recent Labs  Lab 01/10/21 0441 01/11/21 0535 01/12/21 0858 01/13/21 0515 01/14/21 0501 01/15/21 0441  WBC 8.8 8.6 7.5 12.3* 10.8* 14.1*  NEUTROABS 7.0 6.7 5.7 10.2*  --   --   HGB 13.8 13.9 14.6 14.9 14.3 14.2  HCT 37.9* 39.3 39.3 41.4 39.9 40.6  MCV 91.5 92.9 91.0 92.0 94.3 96.2  PLT 243 233 245 345 290 [id number]    Basic Metabolic Panel: Recent Labs  Lab 01/11/21 0535 01/12/21 0858 01/13/21 0515 01/14/21 0501 01/15/21 0441 01/16/21 0356  NA 136 137 140 143 143 141  K 3.7 4.1 3.5 3.1* 3.5 3.2*  CL 102 105 105 112* 108 105  CO2 '25 27 26 26 26 29  '$ GLUCOSE 109* 129* 127* 142* 165* 229*  BUN '18 23 20 21 20 '$ 26*  CREATININE 0.67 0.62 0.83 0.85 0.76 0.76  CALCIUM 8.2* 8.2* 8.6* 8.3* 8.8* 8.7*  MG 2.2  --  2.3 2.5* 2.3 2.3  PHOS 2.9  --  2.6 2.8 2.8 2.3*    GFR: Estimated Creatinine Clearance: 76 mL/min (by C-G formula based on SCr of 0.76 mg/dL). Liver Function Tests: Recent Labs  Lab 01/12/21 0858  AST 16  ALT 23  ALKPHOS 45  BILITOT 1.2  PROT 5.8*  ALBUMIN 2.9*    No results for input(s): LIPASE, AMYLASE in the last 168 hours. Recent Labs  Lab 01/10/21 0441 01/12/21 0858  AMMONIA 15 18    Coagulation Profile: No results for  input(s): INR, PROTIME in the last 168 hours. Cardiac Enzymes: No results for input(s): CKTOTAL, CKMB, CKMBINDEX, TROPONINI in the last 168 hours. BNP (last 3 results) No results for input(s): PROBNP in the last 8760 hours. HbA1C: No results for input(s): HGBA1C in the last 72 hours. CBG: Recent Labs  Lab 01/15/21 2007 01/15/21 2338 01/16/21 0359 01/16/21 0724 01/16/21 1124  GLUCAP 221* 219* 233* 230* 245*    Lipid Profile: No results for input(s): CHOL, HDL, LDLCALC, TRIG, CHOLHDL, LDLDIRECT in the last 72 hours. Thyroid Function Tests: No results for input(s): TSH, T4TOTAL, FREET4, T3FREE, THYROIDAB in the last 72 hours. Anemia Panel: No results for input(s): VITAMINB12, FOLATE, FERRITIN, TIBC, IRON, RETICCTPCT in the last 72 hours. Sepsis Labs: Recent Labs  Lab 01/15/21 0441 01/16/21 0356  PROCALCITON <0.10 <0.10     Recent Results (from the past 240 hour(s))  MRSA Next Gen by PCR, Nasal     Status: None   Collection Time: 01/08/21  2:10 PM   Specimen: Nasal Mucosa; Nasal Swab  Result Value Ref Range Status   MRSA by PCR Next Gen  NOT DETECTED NOT DETECTED Final    Comment: (NOTE) The GeneXpert MRSA Assay (FDA approved for NASAL specimens only), is one component of a comprehensive MRSA colonization surveillance program. It is not intended to diagnose MRSA infection nor to guide or monitor treatment for MRSA infections. Test performance is not FDA approved in patients less than 73 years old. Performed at [facility], [address] [place], [place] [address]   CULTURE, BLOOD (ROUTINE X 2) w Reflex to ID Panel     Status: None (Preliminary result)   Collection Time: 01/15/21  8:37 AM   Specimen: BLOOD LEFT HAND  Result Value Ref Range Status   Specimen Description BLOOD LEFT HAND  Final   Special Requests   Final    BOTTLES DRAWN AEROBIC AND ANAEROBIC Blood Culture adequate volume   Culture   Final    NO GROWTH < 24 HOURS Performed at [facility], [address] [place], [place] [address]    Report Status PENDING  Incomplete  CULTURE, BLOOD (ROUTINE X 2) w Reflex to ID Panel     Status: None (Preliminary result)   Collection Time: 01/15/21  8:38 AM   Specimen: BLOOD LEFT FOREARM  Result Value Ref Range Status   Specimen Description BLOOD LEFT FOREARM  Final   Special Requests   Final    BOTTLES DRAWN AEROBIC AND ANAEROBIC Blood Culture adequate volume   Culture   Final    NO GROWTH < 24 HOURS Performed at [facility], [address], [place], [place] [address]    Report Status PENDING  Incomplete      Radiology Studies: CT HEAD WO CONTRAST (5MM)  Result Date: 01/16/2021 CLINICAL DATA:  Mental status change. EXAM: CT HEAD WITHOUT CONTRAST TECHNIQUE: Contiguous axial images were obtained from the base of the skull through the vertex without intravenous contrast. COMPARISON:  CT head dated January 10, 2021 FINDINGS: Brain: Ventricles are unchanged in size. Right frontal approach shunt catheter in place. No evidence of acute infarction, hemorrhage, extra-axial collection or mass lesion/mass effect. Redemonstrated midline tectal plate lipoma. Vascular: No hyperdense vessel or unexpected calcification. Skull: Normal. Negative for fracture or focal lesion. Sinuses/Orbits: No acute finding. Other: Decreased soft tissue gas of the right scalp compared to prior exam. IMPRESSION: Right frontal approach shunt catheter in place. Ventricles are unchanged in size. No intracranial hemorrhage. Electronically Signed   By: Yetta Glassman MD   On: 01/16/2021 13:05   DG Chest Port 1 View  Result Date: 01/15/2021 CLINICAL DATA:  Fever EXAM: PORTABLE CHEST 1 VIEW COMPARISON:  01/08/2021 FINDINGS: Heart and mediastinal contours are within normal limits. No focal opacities or effusions. No acute bony abnormality. IMPRESSION: No active disease. Electronically Signed   By: Rolm Baptise M.D.   On: 01/15/2021 08:38    Scheduled Meds:   bethanechol  10 mg Per Tube TID   Chlorhexidine Gluconate Cloth  6 each Topical Daily   [START ON 01/17/2021] cloNIDine  0.2 mg Transdermal Weekly   feeding supplement (PROSource TF)  45 mL Per Tube Daily   finasteride  5 mg Oral Nightly   free water  100 mL Per Tube Q4H   heparin injection (subcutaneous)  5,000 Units Subcutaneous Q8H   hydrochlorothiazide  25 mg Per Tube Daily   And   lisinopril  20 mg Per Tube Daily   insulin aspart  0-9 Units Subcutaneous Q4H   mouth rinse  15 mL Mouth Rinse BID   melatonin  10 mg Per Tube QHS   pantoprazole (PROTONIX) IV  40 mg Intravenous Daily   senna  1 tablet Per Tube BID   tamsulosin  0.4 mg Oral QPC supper   thiamine injection  100 mg Intravenous Daily   vitamin B-12  1,000 mcg Per Tube Daily   Continuous Infusions:  sodium chloride 5 mL/hr at 01/16/21 1454   feeding supplement (OSMOLITE 1.5 CAL) 1,000 mL (01/16/21 1313)   valproate sodium 500 mg (01/16/21 1513)     LOS: 8 days   Time spent: 35 minutes. More than 50% of the time was spent in counseling/coordination of care  Lorella Nimrod, MD [facility]  If 7PM-7AM, please contact night-coverage [email or web address]  01/16/2021, 3:29 PM   This record has been created using Systems analyst. Errors have been sought and corrected,but may not always be located. Such creation errors do not reflect on the standard of care.

## 2021-01-16 NOTE — Progress Notes (Signed)
Daily Progress Note   Patient Name: Eric [name]       Date: 01/16/2021 DOB: 06-06-1946  Age: 75 y.o. MRN#: [id number] Attending Physician: Meade Maw, MD Primary Care Physician: Ileana Roup, MD Admit Date: 01/08/2021  Reason for Consultation/Follow-up: Establishing goals of care  Subjective: Patient is sitting in bed with eyes closed. He does not respond to voice. Wife is at bedside. Discussed his status and need for SLP eval. DHT remains in place. SLP in to evaluate him during my visit, which was welcomed. He did not open his eyes, but did say a few words, and seemed to do rather well with nectar thick liquids and apple sauce; no coughing or wet voice noted. SLP to leave a note with recommendations.   Wife is really wanting to take him home despite any recommendation otherwise. Discussed her health and safety, and her ability to provide his care depending on the level needed. Discussed multiple scenarios and concerns. Discussed potential nutrition and hydration concerns based on the diet he may be discharged with. Requested PT to see patient when wife is at bedside so she can see his status and needs first hand. Will follow.   Length of Stay: 8  Current Medications: Scheduled Meds:   bethanechol  10 mg Per Tube TID   Chlorhexidine Gluconate Cloth  6 each Topical Daily   [START ON 01/17/2021] cloNIDine  0.2 mg Transdermal Weekly   feeding supplement (PROSource TF)  45 mL Per Tube Daily   finasteride  5 mg Oral Nightly   free water  100 mL Per Tube Q4H   heparin injection (subcutaneous)  5,000 Units Subcutaneous Q8H   hydrochlorothiazide  25 mg Per Tube Daily   And   lisinopril  20 mg Per Tube Daily   insulin aspart  0-9 Units Subcutaneous Q4H   mouth rinse  15 mL Mouth Rinse BID    melatonin  10 mg Per Tube QHS   pantoprazole (PROTONIX) IV  40 mg Intravenous Daily   senna  1 tablet Per Tube BID   tamsulosin  0.4 mg Oral QPC supper   thiamine injection  100 mg Intravenous Daily   vitamin B-12  1,000 mcg Per Tube Daily    Continuous Infusions:  sodium chloride 5 mL/hr at 01/16/21 0600   feeding supplement (OSMOLITE 1.5  CAL) 1,000 mL (01/15/21 1251)   valproate sodium 500 mg (01/16/21 0809)    PRN Meds: sodium chloride, acetaminophen **OR** acetaminophen, bisacodyl, bismuth subsalicylate, haloperidol lactate, hydrALAZINE, labetalol, ondansetron **OR** ondansetron (ZOFRAN) IV  Physical Exam Constitutional:      Comments: Resting with eyes closed.             Vital Signs: BP (!) 142/69 (BP Location: Left Arm)   Pulse 90   Temp 98.9 F (37.2 C) (Axillary)   Resp (!) 21   Ht '5\' 8"'$  (1.727 m)   Wt 79.8 kg   SpO2 96%   BMI 26.75 kg/m  SpO2: SpO2: 96 % O2 Device: O2 Device: Room Air O2 Flow Rate: O2 Flow Rate (L/min): 2 L/min  Intake/output summary:  Intake/Output Summary (Last 24 hours) at 01/16/2021 1004 Last data filed at 01/16/2021 0600 Gross per 24 hour  Intake 515.98 ml  Output 1125 ml  Net -609.02 ml   LBM: Last BM Date: 01/15/21 Baseline Weight: Weight: 79.8 kg Most recent weight: Weight: 79.8 kg    Patient Active Problem List   Diagnosis Date Noted   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 symptoms    Diabetes mellitus without complication (HCC)    Hypertension    Acute metabolic encephalopathy    Leukocytosis    GERD (gastroesophageal reflux disease)    Nasogastric tube present    S/P VP shunt    Hydrocephalus (HCC) 01/08/2021    Palliative Care Assessment & Plan    Recommendations/Plan: Time for outcomes. Will continue to follow.     Code Status:    Code Status Orders  (From admission, onward)           Start     Ordered   01/08/21 1350  Full code  Continuous        01/08/21 1349           Code Status  History     This patient has a current code status but no historical code status.       Prognosis:  Unable to determine    Care plan was discussed with SLP and PT.   Thank you for allowing the Palliative Medicine Team to assist in the care of this patient.   Time In: 9:10 Time Out: 10:00 Total Time 50 min Prolonged Time Billed  no       Greater than 50%  of this time was spent counseling and coordinating care related to the above assessment and plan.  Asencion Gowda, NP  Please contact Palliative Medicine Team phone at [phone] for questions and concerns.

## 2021-01-16 NOTE — Progress Notes (Signed)
  Speech Language Pathology Treatment: Dysphagia  Patient Details Name: Eric [name] MRN: [id number] DOB: 1945-07-17 Today's Date: 01/16/2021 Time: [id number] SLP Time Calculation (min) (ACUTE ONLY): 17 min  Assessment / Plan / Recommendation Clinical Impression  Palliative Care speaking with pt's wife, plan made for ST to try puree and possibly nectar thick liquids. Pt continues to be alert but with eyes closed and not responsive to verbal questions. However, when spoon placed at his mouth, he eagerly accepted all boluses of nectar thick liquids via spoon and applesauce. Pt was free of overt s/s of aspiration when consuming. His voice remained clear and his vitals were stable. Out of an abundance of caution, recommend pt have floor stock applesauce and nectar thick liquids as he is awake and alert. Extensive education provided to pt's wife on the purpose of allowing only floorstock items. The last time, pt consumed an entire tray of puree and nectar thick liquids, he had a decline. His wife voiced understanding. Will follow tomorrow for further trials.   At approximately 1349 today pt's wife requested that I call her. I spoke with her. She asked about getting a full tray for pt as he consumed 1 container of applesauce around lunchtime. Education was provided of the information previous provided. I contacted the kitchen and requested a case of nectar thick water be sent to ICU. I also spoke with pt's nurse to make her aware of my request to kitchen.   No change in current POC at this time. ST will follow tomorrow as planned.     HPI HPI: Eric [name] is a 75 y.o. 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 HE is status post elective Right Frontal VP Shunt insertion.  Post-op with acute  delirium/agitation felt to be due to complication of anesthesia requiring Precedex drip. 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      SLP Plan  Continue with current plan of care       Recommendations  Diet recommendations: Nectar-thick liquid (floor stock puree) Liquids provided via: Teaspoon Medication Administration: Via alternative means Supervision: Full supervision/cueing for compensatory strategies Compensations: Minimize environmental distractions;Slow rate;Small sips/bites Postural Changes and/or Swallow Maneuvers: Seated upright 90 degrees;Upright 30-60 min after meal                Oral Care Recommendations: Oral care QID Follow up Recommendations: Skilled Nursing facility SLP Visit Diagnosis: Dysphagia, oropharyngeal phase (R13.12) Plan: Continue with current plan of care       GO               Liban Guedes B. Rutherford Nail M.S., CCC-SLP, [place] [facility] [phone]  Stormy Fabian 01/16/2021, 2:58 PM

## 2021-01-16 NOTE — Progress Notes (Signed)
Occupational Therapy Treatment Patient Details Name: JONAHTAN HIRA MRN: [id number] DOB: 02-02-1946 Today's Date: 01/16/2021    History of present illness Patient is a 75 y.o. male with Hydrocephalus status post elective Right Frontal VP Shunt insertion.  Post-op with acute delirium/agitation felt to be due to complication of anesthesia plus Hyponatremia requiring Precedex drip.   OT comments  Pt seen for OT/PT co-treatment on this date. Upon arrival to room, pt asleep in bed with mittens donned and wife at bedside. Pt very lethargic this date and only able to maintain alertness for seconds at a time, even following tactile/verbal cues, change in position, application of cold washcloth to face, and application of lemon oral swab in mouth. Pt talking at times, but eyes closed for majority of session. This date, pt was able to tolerate sitting EOB for ~10 mins and was able to bring LUE to face to touch nose (NG tube in place), however was unable to engage UE in grooming and hygiene tasks while seated EOB d/t decreased alertness. Additionally, pt engaged in bed mobility and x1 sit>stand attempt, however required MAX A+2. At end of session, OT/PT reiterated discharge recommendations and POC with wife. Pt continues to benefit from skilled OT services to maximize return to PLOF and minimize risk of future falls, injury, caregiver burden, and readmission. Will continue to follow POC. Discharge recommendation remains appropriate.     Follow Up Recommendations  SNF    Equipment Recommendations  3 in 1 bedside commode;Tub/shower seat;Other (comment) (2ww)       Precautions / Restrictions Precautions Precautions: Fall Precaution Comments: intermittent agitation. hand mitts Restrictions Weight Bearing Restrictions: No       Mobility Bed Mobility Overal bed mobility: Needs Assistance Bed Mobility: Supine to Sit;Sit to Supine     Supine to sit: Max assist;+2 for physical assistance Sit to supine: Max  assist;+2 for physical assistance   General bed mobility comments: verbal and tactile cues for task initition, sequencing. patient required increased assistance today with bed mobility compared to yesterday. eyes closed for most of session, however patient does initiate movement    Transfers Overall transfer level: Needs assistance Equipment used: None Transfers: Sit to/from Stand Sit to Stand: Max assist;+2 physical assistance         General transfer comment: patient initiates standing to command. significant assistance of 2 person is required for standing with cues for upright posture. patient flexed forward with standing efforts, however does accept weight in BLE with standing tolerance of less than 20 seconds.    Balance Overall balance assessment: Needs assistance Sitting-balance support: Feet supported;Bilateral upper extremity supported Sitting balance-Leahy Scale: Poor Sitting balance - Comments: Mod A required initially with posterior lean, progressing to close stand by assistance with cues for midline and postural awareness   Standing balance support: Bilateral upper extremity supported;During functional activity Standing balance-Leahy Scale: Zero Standing balance comment: unable to come to full upright standing position. +2 person assistance is required to maintain standing                           ADL either performed or assessed with clinical judgement   ADL Overall ADL's : Needs assistance/impaired     Grooming: Wash/dry face;Maximal assistance;Sitting Grooming Details (indicate cue type and reason): pt able to bring LUE to face, however unable to engage UE in grooming and hygiene tasks while seated EOB d/t decreased alertness  Functional mobility during ADLs: Maximal assistance;+2 for safety/equipment        Cognition Arousal/Alertness: Lethargic Behavior During Therapy: Flat affect Overall Cognitive  Status: Impaired/Different from baseline Area of Impairment: Orientation;Following commands;Safety/judgement;Attention;Awareness                 Orientation Level: Disoriented to;Place;Time;Situation     Following Commands: Follows one step commands inconsistently Safety/Judgement: Decreased awareness of safety;Decreased awareness of deficits     General Comments: Pt very lethargic this date; pt only able to maintain alertness for seconds at a time, even following tactile/verbal cues, change in position, application of cold washcloth to face, and application of lemon oral swab in mouth. Pt talking at times, but eyes remaining closed for majority of session                   Pertinent Vitals/ Pain       Pain Assessment:  (patient is unable to state. no signs of pain noted with mobility efforts)         Frequency  Min 2X/week        Progress Toward Goals  OT Goals(current goals can now be found in the care plan section)  Progress towards OT goals: Progressing toward goals  Acute Rehab OT Goals Patient Stated Goal: none stated/patient unable to participate in goal setting OT Goal Formulation: With patient Time For Goal Achievement: 01/25/21 Potential to Achieve Goals: Good  Plan Discharge plan remains appropriate;Frequency remains appropriate    Co-evaluation    PT/OT/SLP Co-Evaluation/Treatment: Yes Reason for Co-Treatment: Complexity of the patient's impairments (multi-system involvement);Necessary to address cognition/behavior during functional activity PT goals addressed during session: Mobility/safety with mobility OT goals addressed during session: ADL's and self-care      AM-PAC OT "6 Clicks" Daily Activity     Outcome Measure   Help from another person eating meals?: A Lot Help from another person taking care of personal grooming?: A Lot Help from another person toileting, which includes using toliet, bedpan, or urinal?: Total Help from another  person bathing (including washing, rinsing, drying)?: A Lot Help from another person to put on and taking off regular upper body clothing?: A Lot Help from another person to put on and taking off regular lower body clothing?: A Lot 6 Click Score: 11    End of Session Equipment Utilized During Treatment: Gait belt;Oxygen  OT Visit Diagnosis: Unsteadiness on feet (R26.81);Muscle weakness (generalized) (M62.81);Other symptoms and signs involving cognitive function   Activity Tolerance Patient limited by lethargy   Patient Left in bed;with call bell/phone within reach;with bed alarm set;Other (comment) (with mitts donned)   Nurse Communication Mobility status        Time: [id number] OT Time Calculation (min): 30 min  Charges: OT General Charges $OT Visit: 1 Visit OT Treatments $Self Care/Home Management : 8-22 mins  Fredirick Maudlin, OTR/L [place]

## 2021-01-16 NOTE — Plan of Care (Signed)

## 2021-01-16 NOTE — Progress Notes (Signed)
Physical Therapy Treatment Patient Details Name: Eric [name] MRN: [id number] DOB: 10-16-1945 Today's Date: 01/16/2021    History of Present Illness Patient is a 75 y.o. male with Hydrocephalus status post elective Right Frontal VP Shunt insertion.  Post-op with acute delirium/agitation felt to be due to complication of anesthesia plus Hyponatremia requiring Precedex drip.    PT Comments    Patient is more lethargic this session than previous session. Even with maximal stimulation, patient is only able to maintain eye opening for seconds at a time. He does participate with mobility efforts intermittently with maximal stimulation for alertness, cues for technique, sequencing, task initiation. Patient was able to sit up on edge of bed for ~ 10 minutes with Mod A initially progressing to close stand by assistance. Max A +2 person required today for bed mobility and for standing x 1 bout. Difficulty coming to full standing position with cues and facilitation for upright posture. Patient does try to touch his nose multiple times with LUE (NG tube in place) and required mitts to be maintained during mobility efforts for safety.  Had a discussion with the spouse who was present during the session. Highly recommend SNF placement at discharge for ongoing PT efforts to maximize independence and decrease caregiver burden. PT will continue to follow as patient able to participate. Also discussed plan with Palliative Care.    Follow Up Recommendations  SNF     Equipment Recommendations  Rolling walker with 5" wheels    Recommendations for Other Services       Precautions / Restrictions Precautions Precautions: Fall Precaution Comments: intermittent agitation. hand mitts Restrictions Weight Bearing Restrictions: No    Mobility  Bed Mobility Overal bed mobility: Needs Assistance Bed Mobility: Supine to Sit;Sit to Supine     Supine to sit: Max assist;+2 for physical assistance Sit to supine: Max  assist;+2 for physical assistance   General bed mobility comments: verbal and tactile cues for task initition, sequencing. patient required increased assistance today with bed mobility compared to yesterday. eyes closed for most of session, however patient does initiate movement    Transfers Overall transfer level: Needs assistance   Transfers: Sit to/from Stand Sit to Stand: Max assist;+2 physical assistance         General transfer comment: patient initiates standing to command. significant assistance of 2 person is required for standing with cues for upright posture. patient flexed forward with standing efforts, however does accept weight in BLE with standing tolerance of less than 20 seconds.  Ambulation/Gait             General Gait Details: unable to progress due to poor standing tolerance, lethargy and fatigue with minimal activity   Stairs             Wheelchair Mobility    Modified Rankin (Stroke Patients Only)       Balance Overall balance assessment: Needs assistance Sitting-balance support: Feet supported Sitting balance-Leahy Scale: Poor Sitting balance - Comments: Mod A required initially with posterior lean, progressing to close stand by assistance with cues for midline and postural awareness   Standing balance support: Bilateral upper extremity supported Standing balance-Leahy Scale: Zero Standing balance comment: flexed trunk, difficulty with hip extension. unable to come to full upright standing position. +2 person assistance is required                            Cognition Arousal/Alertness: Lethargic Behavior During Therapy: Flat  affect (occasionally trying to touch nose with LUE (mitts in place for safety)) Overall Cognitive Status: Impaired/Different from baseline                                 General Comments: patient very lethargic today compared to yesterday. maximal cues and stimulation provided to increse  alertness. applied wet/cool washcloth to face, turned lights on in room, voice, and tactile stimulation provided. patient sustained eye opened for seconds at at time and needs constant cueing to maintain alertness today      Exercises      General Comments        Pertinent Vitals/Pain Pain Assessment:  (patient is unable to state. no signs of pain noted with mobility efforts)    Home Living Family/patient expects to be discharged to:: Inpatient rehab Living Arrangements: Spouse/significant other                  Prior Function            PT Goals (current goals can now be found in the care plan section) Acute Rehab PT Goals Patient Stated Goal: none stated/patient unable to participate in goal setting PT Goal Formulation: With family Time For Goal Achievement: 01/25/21 Potential to Achieve Goals: Fair Progress towards PT goals: Progressing toward goals    Frequency    7X/week      PT Plan Current plan remains appropriate    Co-evaluation PT/OT/SLP Co-Evaluation/Treatment: Yes Reason for Co-Treatment: Complexity of the patient's impairments (multi-system involvement);Necessary to address cognition/behavior during functional activity PT goals addressed during session: Mobility/safety with mobility OT goals addressed during session: ADL's and self-care      AM-PAC PT "6 Clicks" Mobility   Outcome Measure  Help needed turning from your back to your side while in a flat bed without using bedrails?: A Lot Help needed moving from lying on your back to sitting on the side of a flat bed without using bedrails?: Total Help needed moving to and from a bed to a chair (including a wheelchair)?: Total Help needed standing up from a chair using your arms (e.g., wheelchair or bedside chair)?: Total Help needed to walk in hospital room?: Total Help needed climbing 3-5 steps with a railing? : Total 6 Click Score: 7    End of Session   Activity Tolerance: Patient  limited by lethargy;Patient limited by fatigue Patient left: in bed;with call bell/phone within reach;with family/visitor present Nurse Communication: Mobility status PT Visit Diagnosis: Unsteadiness on feet (R26.81);Muscle weakness (generalized) (M62.81);Other abnormalities of gait and mobility (R26.89)     Time: [id number] PT Time Calculation (min) (ACUTE ONLY): 27 min  Charges:  $Therapeutic Activity: 8-22 mins                     Minna Merritts, PT, MPT    Percell Locus 01/16/2021, 11:30 AM

## 2021-01-17 DIAGNOSIS — E119 Type 2 diabetes mellitus without complications: Secondary | ICD-10-CM | POA: Diagnosis not present

## 2021-01-17 DIAGNOSIS — N400 Enlarged prostate without lower urinary tract symptoms: Secondary | ICD-10-CM | POA: Diagnosis not present

## 2021-01-17 DIAGNOSIS — G919 Hydrocephalus, unspecified: Secondary | ICD-10-CM | POA: Diagnosis not present

## 2021-01-17 DIAGNOSIS — Z7189 Other specified counseling: Secondary | ICD-10-CM | POA: Diagnosis not present

## 2021-01-17 LAB — GLUCOSE, CAPILLARY
Glucose-Capillary: 236 mg/dL — ABNORMAL HIGH (ref 70–99)
Glucose-Capillary: 184 mg/dL — ABNORMAL HIGH (ref 70–99)
Glucose-Capillary: 260 mg/dL — ABNORMAL HIGH (ref 70–99)
Glucose-Capillary: 204 mg/dL — ABNORMAL HIGH (ref 70–99)
Glucose-Capillary: 266 mg/dL — ABNORMAL HIGH (ref 70–99)

## 2021-01-17 LAB — BASIC METABOLIC PANEL
Anion gap: 7 (ref 5–15)
BUN: 22 mg/dL (ref 8–23)
CO2: 28 mmol/L (ref 22–32)
Calcium: 8.8 mg/dL — ABNORMAL LOW (ref 8.9–10.3)
Chloride: 102 mmol/L (ref 98–111)
Creatinine, Ser: 0.78 mg/dL (ref 0.61–1.24)
GFR, Estimated: >60 mL/min (ref >60)
Glucose, Bld: 228 mg/dL — ABNORMAL HIGH (ref 70–99)
Potassium: 4.2 mmol/L (ref 3.5–5.1)
Sodium: 137 mmol/L (ref 135–145)

## 2021-01-17 LAB — PHOSPHORUS: Phosphorus: 2.4 mg/dL — ABNORMAL LOW (ref 2.5–4.6)

## 2021-01-17 LAB — MAGNESIUM: Magnesium: 2.2 mg/dL (ref 1.7–2.4)

## 2021-01-17 LAB — PROCALCITONIN: Procalcitonin: <0.1 ng/mL

## 2021-01-17 MED ORDER — INSULIN ASPART 100 UNIT/ML IJ SOLN
3.0000 [IU] | INTRAMUSCULAR | Status: DC
  Administered 2021-01-17 – 2021-01-19 (×11): 3 [IU] via SUBCUTANEOUS
  Filled 2021-01-17 (×10): qty 1

## 2021-01-17 MED ORDER — VALPROATE SODIUM 100 MG/ML IV SOLN
250.0000 mg | Freq: Four times a day (QID) | INTRAVENOUS | Status: DC
  Filled 2021-01-17 (×2): qty 2.5

## 2021-01-17 MED ORDER — VALPROIC ACID 250 MG/5ML PO SOLN
250.0000 mg | Freq: Every day | ORAL | Status: AC
  Administered 2021-01-20 – 2021-01-21 (×2): 250 mg
  Filled 2021-01-17 (×2): qty 5

## 2021-01-17 MED ORDER — VALPROIC ACID 250 MG/5ML PO SOLN
250.0000 mg | Freq: Two times a day (BID) | ORAL | Status: AC
  Administered 2021-01-17 – 2021-01-19 (×4): 250 mg
  Filled 2021-01-17 (×4): qty 5

## 2021-01-17 MED ORDER — VALPROIC ACID 250 MG/5ML PO SOLN
250.0000 mg | Freq: Four times a day (QID) | ORAL | Status: DC
  Administered 2021-01-17: 250 mg
  Filled 2021-01-17 (×3): qty 5

## 2021-01-17 MED ORDER — INSULIN ASPART 100 UNIT/ML IJ SOLN: 3.0000 [IU] | INTRAMUSCULAR | Status: DC

## 2021-01-17 NOTE — Progress Notes (Addendum)
  Speech Language Pathology Treatment: Dysphagia  Patient Details Name: Eric [name] MRN: [id number] DOB: 21-Jul-1945 Today's Date: 01/17/2021 Time: [id number] SLP Time Calculation (min) (ACUTE ONLY): 25 min  Assessment / Plan / Recommendation Clinical Impression  Pt appeared alert but didn't respond to any verbal cues, grunted when repositioned in bed and started swinging his arm at SLP when tactile cues were provided to increase arousal. After pt made physical movements, SLP presented a spoonful of nectar thick to pt's lips. Pt with no response to spoon or wetness at his lips. It is my understanding that pt's wife provided bites and sips of floorstock puree (applesauce) and nectar thick liquids via spoon earlier today. I was not present to assess for any overt s/s of aspiration.   At this time, pt's aspiration with any PO intake cannot be fully assessed at bedside. Pt is not a candidate for an instrumental study d/t lethargy, irritability/behaviors. At bedside, he has not demonstrated the cognitive ability to sustain attention to consuming any POs, follow any directions, vocalize during PO trials so unable to assess quality of pt's voice. At this time, I am not comfortable with pt consuming more than bites and sips of puree (floor stock) and nectar thick liquids via spoon. I am not comfortable with putting in an order for a diet. If pt presents with concerning s/s of aspiration at bedside he should be made NPO at bedside. If this situation occurs, pt should remain NPO for remainder of hospitalization. ST consult is not needed.   ST to be re-consulted only when pt can follow basic directions, can verbalize a response (any response) to interactions/questions throughout his day.    I have provided this education to pt's wife on several occasions. Please refer any of wife's questions regarding oral diet to Palliative Care to help her with decision making.     HPI HPI: Eric [name] is a 75 y.o. 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 HE is status post elective Right Frontal VP Shunt insertion.  Post-op with acute delirium/agitation felt to be due to complication of anesthesia requiring Precedex drip. 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      SLP Plan  Discharge SLP treatment due to (comment) (lack of progress towards goals)       Recommendations  Diet recommendations: NPO (bites of floor stock puree and sips of nectar thick via spoon) Liquids provided via: Teaspoon Medication Administration: Via alternative means Supervision: Full supervision/cueing for compensatory strategies;Staff to assist with self feeding;Trained caregiver to feed patient Compensations: Minimize environmental distractions;Slow rate;Small sips/bites Postural Changes and/or Swallow Maneuvers: Seated upright 90 degrees;Upright 30-60 min after meal                Oral Care Recommendations: Oral care QID Follow up Recommendations: Skilled Nursing facility SLP Visit Diagnosis: Dysphagia, oropharyngeal phase (R13.12);Cognitive communication deficit (R41.841) Plan: Discharge SLP treatment due to (comment) (lack of progress towards goals)       GO               Noriah Osgood B. Rutherford Nail M.S., CCC-SLP, [place] Office [phone]  Stormy Fabian 01/17/2021, 4:12 PM

## 2021-01-17 NOTE — Progress Notes (Signed)
   Progress Note  History: [name] is POD#9 from right frontal VP shunt placement for hydrocephalus. He has dementia at baseline which was exacerbated by anesthesia from his recent surgery.   He continued to have some drowsiness however seems to be more alert this morning.  Infectious work-up did not reveal any obvious etiology for an infection.  Medicine team continues to follow.   Physical Exam: Vitals:   01/17/21 0600 01/17/21 0815  BP: (!) 160/84 (!) 144/67  Pulse: (!) 101   Resp: (!) 22   Temp:    SpO2: 98%     Drowsy but arousable. Oriented to person, year, and place Following commands in all extremities All 3 incisions are c/d/i Abdomen soft  Data:  Recent Labs  Lab 01/15/21 0441 01/16/21 0356 01/17/21 0441  NA 143 141 137  K 3.5 3.2* 4.2  CL 108 105 102  CO2 '26 29 28  '$ BUN 20 26* 22  CREATININE 0.76 0.76 0.78  GLUCOSE 165* 229* 228*  CALCIUM 8.8* 8.7* 8.8*    Recent Labs  Lab 01/12/21 0858  AST 16  ALT 23  ALKPHOS 45      Recent Labs  Lab 01/13/21 0515 01/14/21 0501 01/15/21 0441  WBC 12.3* 10.8* 14.1*  HGB 14.9 14.3 14.2  HCT 41.4 39.9 40.6  PLT 345 290 288    No results for input(s): APTT, INR in the last 168 hours.       Other tests/results: Stunt series completed post-op without noted complication.   CT head 01/10/21 showed good placement of catheter without any hemorrhage  Assessment/Plan:  Eric [name]  is a 75 y.o male s/p right frontal VP shunt. He is improving from post-operative delirium.  - DVT prophylaxis (On Heparin 5,000 q8) - PTOT  - Appreciate medicine and neurology assistance.  - Prioritize transfer out of the unit to help facilitate delirium improvement - will request medical assistance - Continue to sit up during the day, leave lights on in the room, and increase patient engagement when possible.  -Continue tube feeds, will need to determine feeding plan going forward with speech therapy -Patient will likely  need placement on discharge from the hospital vs [facility] resources. Will further discuss with pts wife  Cooper Render [facility] Neurosurgery

## 2021-01-17 NOTE — Progress Notes (Signed)
Occupational Therapy Treatment Patient Details Name: Eric [name] MRN: [id number] DOB: [birth year] Today's Date: 01/17/2021    History of present illness Patient is a 75 y.o. male with Hydrocephalus status post elective Right Frontal VP Shunt insertion.  Post-op with acute delirium/agitation felt to be due to complication of anesthesia plus Hyponatremia requiring Precedex drip.   OT comments  Chart reviewed, pt is greeted in bed with wife present. Co-tx with PT completed due to pt orientation and mobility status. Pt is oriented to self, disoriented to place, situation, time. Pt is greeted in bed, able to be aroused however required cueing throughout to maintain attention. Pt tolerated treatment without complaint, multi modal cueing required throughout tasks. Pt appears to have made progress from previous tx session  however impaired cognition continues to impede performance. Please see details below. Wife provided education on orientation of pt to facilitate improved orientation and cognition. Pt continues to benefit from skilled OT services to maximize return to PLOF and minimize risk of future falls, injury, caregiver burden, and readmission. Will continue to follow POC. Discharge recommendation remains appropriate.     Follow Up Recommendations  SNF    Equipment Recommendations  3 in 1 bedside commode;Tub/shower seat    Recommendations for Other Services      Precautions / Restrictions Precautions Precautions: Fall Precaution Comments: hand mitts Restrictions Weight Bearing Restrictions: No       Mobility Bed Mobility Overal bed mobility: Needs Assistance Bed Mobility: Supine to Sit;Sit to Supine;Rolling Rolling: Max assist   Supine to sit: Max assist;+2 for physical assistance Sit to supine: Max assist;+2 for physical assistance   General bed mobility comments: Pt continues to initiate movement with bed mobility/progressive mobility    Transfers Overall transfer level:  Needs assistance Equipment used: Rolling walker (2 wheeled) Transfers: Sit to/from Stand Sit to Stand: Mod assist;Max assist         General transfer comment: MAX A +2 x2; MOD A +2 x1. Kypthoic posture noted with slight improvements in posture with verbal and tactile cueing.    Balance Overall balance assessment: Needs assistance Sitting-balance support: Feet supported;Bilateral upper extremity supported Sitting balance-Leahy Scale: Poor Sitting balance - Comments: posterior lean Postural control: Posterior lean Standing balance support: Bilateral upper extremity supported Standing balance-Leahy Scale: Poor                             ADL either performed or assessed with clinical judgement   ADL Overall ADL's : Needs assistance/impaired     Grooming: Wash/dry face;Maximal assistance;Sitting Grooming Details (indicate cue type and reason): HOH A for washing face at edge of bed; Pt able to bring hand to face, poor sequencing for ADL completion                     Toileting- Clothing Manipulation and Hygiene: Maximal assistance       Functional mobility during ADLs: Maximal assistance;+2 for safety/equipment                         Cognition Arousal/Alertness: Lethargic Behavior During Therapy: Flat affect Overall Cognitive Status: Impaired/Different from baseline Area of Impairment: Orientation;Attention;Following commands;Safety/judgement;Awareness;Problem solving                 Orientation Level: Disoriented to;Place;Time;Situation     Following Commands: Follows one step commands inconsistently Safety/Judgement: Decreased awareness of safety;Decreased awareness of deficits   Problem Solving:  Slow processing;Difficulty sequencing;Requires verbal cues;Requires tactile cues General Comments: Pt with continued lethargy on this date with eyes closed during mobility, seated at edge of bed, attempted ADL completion. Pt opening eyes at  times with step by step verbal cueing. One step direction following is impaired, however pt does appear with slight improvements in attention as compared to previous tx session.                          Pertinent Vitals/ Pain       Pain Assessment: Faces Faces Pain Scale: Hurts a little bit Pain Location: posterior trunk Pain Descriptors / Indicators: Discomfort                                                          Frequency  Min 2X/week        Progress Toward Goals  OT Goals(current goals can now be found in the care plan section)  Progress towards OT goals: Progressing toward goals  Acute Rehab OT Goals Patient Stated Goal: to go to rehab OT Goal Formulation: With patient/family Time For Goal Achievement: 01/31/21 Potential to Achieve Goals: Good  Plan Discharge plan remains appropriate;Frequency remains appropriate    Co-evaluation    PT/OT/SLP Co-Evaluation/Treatment: Yes     OT goals addressed during session: ADL's and self-care;Other (comment) (progressive mobility in preparation for future ADL participation)      AM-PAC OT "6 Clicks" Daily Activity     Outcome Measure   Help from another person eating meals?: A Lot Help from another person taking care of personal grooming?: A Lot Help from another person toileting, which includes using toliet, bedpan, or urinal?: Total Help from another person bathing (including washing, rinsing, drying)?: Total Help from another person to put on and taking off regular upper body clothing?: A Lot Help from another person to put on and taking off regular lower body clothing?: A Lot 6 Click Score: 10    End of Session Equipment Utilized During Treatment: Rolling walker  OT Visit Diagnosis: Unsteadiness on feet (R26.81);Muscle weakness (generalized) (M62.81);Other symptoms and signs involving cognitive function   Activity Tolerance Patient limited by lethargy   Patient Left in bed;with  call bell/phone within reach;with bed alarm set;with family/visitor present   Nurse Communication          Time: [id number] OT Time Calculation (min): 30 min  Charges: OT General Charges $OT Visit: 1 Visit OT Treatments $Self Care/Home Management : 8-22 mins  Shanon Payor, OTD OTR/L  01/17/21, 12:15 PM

## 2021-01-17 NOTE — Progress Notes (Signed)
Physical Therapy Treatment Patient Details Name: Eric [name] MRN: [id number] DOB: 11/13/1945 Today's Date: 01/17/2021    History of Present Illness Patient is a 75 y.o. male with Hydrocephalus status post elective Right Frontal VP Shunt insertion.  Post-op with acute delirium/agitation felt to be due to complication of anesthesia plus Hyponatremia requiring Precedex drip.    PT Comments    PT/OT co-treat for pt safety and requiring +2 assistance throughout. Pt continues to be severely limited by cognition and alertness. He was able to exit R side of bed with increased time and +2 max assist. Pt struggles to initiate movements however once seated EOB does become more alert and even able to answers simple questions. Pt much less alert than previously seen by author several days earlier. Pt's supportive spouse was at bedside throughout and educated throughout on activity/exercises to improve pt's lethargy while minimizing weakness. Pt did stand several times with +2 assist and even tolerated a few steps to [facility] form FOB. Overall pt severely limited by lethargy/fatigue. Acute PT will continue to treat per POC progressing as able per pt tolerance.    Follow Up Recommendations  SNF     Equipment Recommendations  Other (comment) (defer to next level of care)       Precautions / Restrictions Precautions Precautions: Fall Precaution Comments: hand mitts Restrictions Weight Bearing Restrictions: No    Mobility  Bed Mobility Overal bed mobility: Needs Assistance Bed Mobility: Supine to Sit;Sit to Supine Rolling: Max assist   Supine to sit: Max assist;+2 for physical assistance Sit to supine: Max assist;+2 for physical assistance   General bed mobility comments: Pt continues to initiate movement with bed mobility/progressive mobility    Transfers Overall transfer level: Needs assistance Equipment used: Rolling walker (2 wheeled) Transfers: Sit to/from Stand Sit to Stand: Mod assist;Max  assist         General transfer comment: MAX A +2 x2; MOD A +2 x1. Kypthoic posture noted with slight improvements in posture with verbal and tactile cueing.  Ambulation/Gait Ambulation/Gait assistance: Mod assist;+2 safety/equipment Gait Distance (Feet): 3 Feet Assistive device: Rolling walker (2 wheeled) Gait Pattern/deviations: Step-to pattern;Trunk flexed Gait velocity: decreased   General Gait Details: Pt was able to take ~ 3 very poor steps towards [facility] with assistance with lateral wt shift and max vcs for progression       Balance Overall balance assessment: Needs assistance Sitting-balance support: Feet supported;Bilateral upper extremity supported Sitting balance-Leahy Scale: Poor Sitting balance - Comments: posterior lean Postural control: Posterior lean Standing balance support: Bilateral upper extremity supported Standing balance-Leahy Scale: Poor      Cognition Arousal/Alertness: Lethargic Behavior During Therapy: Flat affect Overall Cognitive Status: Impaired/Different from baseline Area of Impairment: Orientation;Attention;Following commands;Safety/judgement;Awareness;Problem solving    Orientation Level: Disoriented to;Place;Time;Situation Current Attention Level: Alternating   Following Commands: Follows one step commands inconsistently Safety/Judgement: Decreased awareness of safety;Decreased awareness of deficits   Problem Solving: Slow processing;Difficulty sequencing;Requires verbal cues;Requires tactile cues General Comments: Pt is lethargic an dneeds constant cues and encouragament for participation. Sternal rub to wake pt at beginning of session.             Pertinent Vitals/Pain Pain Assessment: No/denies pain (did not endorse pain) Faces Pain Scale: Hurts a little bit Pain Location: posterior trunk Pain Descriptors / Indicators: Discomfort     PT Goals (current goals can now be found in the care plan section) Acute Rehab PT  Goals Patient Stated Goal: none stated Progress towards PT goals:  Progressing toward goals    Frequency    7X/week      PT Plan Current plan remains appropriate    Co-evaluation PT/OT/SLP Co-Evaluation/Treatment: Yes Reason for Co-Treatment: Complexity of the patient's impairments (multi-system involvement);Necessary to address cognition/behavior during functional activity;For patient/therapist safety PT goals addressed during session: Mobility/safety with mobility;Balance;Proper use of DME;Strengthening/ROM OT goals addressed during session: ADL's and self-care;Other (comment) (progressive mobility in preparation for future ADL participation)      AM-PAC PT "6 Clicks" Mobility   Outcome Measure  Help needed turning from your back to your side while in a flat bed without using bedrails?: A Lot Help needed moving from lying on your back to sitting on the side of a flat bed without using bedrails?: Total Help needed moving to and from a bed to a chair (including a wheelchair)?: Total Help needed standing up from a chair using your arms (e.g., wheelchair or bedside chair)?: Total Help needed to walk in hospital room?: Total Help needed climbing 3-5 steps with a railing? : Total 6 Click Score: 7    End of Session Equipment Utilized During Treatment: Gait belt Activity Tolerance: Patient limited by lethargy;Patient limited by fatigue Patient left: in bed;with call bell/phone within reach;with family/visitor present Nurse Communication: Mobility status PT Visit Diagnosis: Unsteadiness on feet (R26.81);Muscle weakness (generalized) (M62.81);Other abnormalities of gait and mobility (R26.89)     Time: [id number] PT Time Calculation (min) (ACUTE ONLY): 32 min  Charges:  $Therapeutic Activity: 8-22 mins                    Julaine Fusi PTA 01/17/21, 4:13 PM

## 2021-01-17 NOTE — Progress Notes (Signed)
PROGRESS NOTE  Consult-progress note  Eric [name]  [id number] DOB: 1946/06/05 DOA: 01/08/2021 PCP: Ileana Roup, MD   Brief Narrative: Taken from consult note. Eric [name] is an 75 y.o. male with PMH of hypertension, hyperlipidemia, diabetes mellitus, GERD, depression, anxiety, BPH, hydrocephalus, who is admitted by neurosurgery for VP shunt placement for hydrocephalus. We are asked to to consult for management of chronic medical issues and altered mental status/delirium.   Pt is POD #6 from right frontal VP shunt placement for hydrocephalus. After the surgery, pt developed confusion, agitation, suspecting posterior surgery delirium.  PCCM was consulted.  Patient was initially treated with Precedex.  He has been off Precedex for more than 24 hours.  Patient is currently on tube feeding.  Per his wife at the bedside, at his normal baseline, patient is alert and orientated x3.  Currently patient knows his own name, but is not orientated to the place and time. He moves all extremities. 8/1: Little more alert but remains lethargic. 8/2: Initial swallow evaluation with increased risk of aspiration, he was able to take some applesauce and nectar thick with swallow team today.  Remained agitated, a lot of behavioral issues. Repeat CT head was without any new abnormality. 8/3: Tapering valproic acid, added insulin coverage for tube feeds  Subjective: Patient was seen and examined today.  Appears little agitated.  Wants his DHT out per his wife and hoping that he can tolerate oral feed.  Tolerated applesauce   Assessment & Plan:   Principal Problem:   Hydrocephalus ([place]) Active Problems:   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 symptoms   Diabetes mellitus without complication (HCC)   Hypertension   Acute metabolic encephalopathy   Leukocytosis   GERD (gastroesophageal reflux disease)   Nasogastric tube present   S/P VP shunt  Hydrocephalus ([place]): s/p of VP shunt  placement. -Management per neurosurgery -Repeat CT head without any new abnormality.  Acute metabolic encephalopathy: Likely due to postsurgical delirium.  PCCM was consulted initially, treated with Precedex, patient has been off Precedex for more than 24 hours.  Feeding tube is placed, currently on tube feed. Ammonia level was normal.Intermittent agitation remains -Tapering VPA from 250 mg 4 times a day to 250 mg twice a day for 2 days followed by 250 mg daily for 2 days and then stop per neuro -Monitor with frequent neurochecks. -No new abnormality noted on repeat CT head today. -Able to swallow some with swallow team..  He was eating applesauce fed by his wife.  Upper respiratory secretions.  Seems improving -Continue chest PT -Suction secretions as needed -Aspiration precautions  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 symptoms -Proscar and Flomax   Diabetes mellitus without complication ([place]): Recent A1c 6.8, well controlled.  Patient is not taking medications currently -Sliding scale insulin.  Adding NovoLog 3 units every 4 hours.  Hold if tube feeds are held/stopped for any reason   Hypertension -Clonidine patch, lisinopril, HCTZ -IV labetalol as needed   Leukocytosis: WBC 12.3>>10.8->14.1  No source of infection identified.  No fever.  Likely reactive, procalcitonin negative.  UA is not very impressive.  Blood cultures remain negative. -Monitor for any aspiration. -Follow-up with CBC   GERD (gastroesophageal reflux disease) -Protonix  Electrolyte abnormalities.  Found to have mild hypophosphatemia and hypokalemia. -Replete electrolytes as needed and monitor.  Objective: Vitals:   01/17/21 1200 01/17/21 1300 01/17/21 1330 01/17/21 1411  BP: (!) 89/48  115/62 122/60  Pulse: 83 93 90 93  Resp: (!) 23  (!)  22 18  Temp: 97.9 F (36.6 C)   98.5 F (36.9 C)  TempSrc: Oral   Oral  SpO2: 90% 95% 96% 94%  Weight:      Height:        Intake/Output Summary (Last 24  hours) at 01/17/2021 1510 Last data filed at 01/17/2021 1000 Gross per 24 hour  Intake 2012.98 ml  Output 800 ml  Net 1212.98 ml   Filed Weights   01/16/21 0428 01/16/21 0800  Weight: 79.8 kg 79.8 kg    Examination: General.  Chronically ill-appearing, little agitated elderly man in no acute distress.  DHT in place Pulmonary.  Lungs clear bilaterally, normal respiratory effort. CV.  Regular rate and rhythm, no JVD, rub or murmur. Abdomen.  Soft, nontender, nondistended, BS positive. CNS.  Alert but not answering any questions, no apparent focal deficit Extremities.  No edema, no cyanosis, pulses intact and symmetrical. Psychiatry.  Judgment and insight appears impaired.  Level of care: Progressive Cardiac  All the records are reviewed and case discussed with Care Management/Social Worker. Management plans discussed with the patient, nursing and they are in agreement.   Procedures:  Antimicrobials:   Data Reviewed: I have personally reviewed following labs and imaging studies  CBC: Recent Labs  Lab 01/11/21 0535 01/12/21 0858 01/13/21 0515 01/14/21 0501 01/15/21 0441  WBC 8.6 7.5 12.3* 10.8* 14.1*  NEUTROABS 6.7 5.7 10.2*  --   --   HGB 13.9 14.6 14.9 14.3 14.2  HCT 39.3 39.3 41.4 39.9 40.6  MCV 92.9 91.0 92.0 94.3 96.2  PLT 233 245 345 290 [id number]   Basic Metabolic Panel: Recent Labs  Lab 01/13/21 0515 01/14/21 0501 01/15/21 0441 01/16/21 0356 01/17/21 0441  NA 140 143 143 141 137  K 3.5 3.1* 3.5 3.2* 4.2  CL 105 112* 108 105 102  CO2 '26 26 26 29 28  '$ GLUCOSE 127* 142* 165* 229* 228*  BUN '20 21 20 '$ 26* 22  CREATININE 0.83 0.85 0.76 0.76 0.78  CALCIUM 8.6* 8.3* 8.8* 8.7* 8.8*  MG 2.3 2.5* 2.3 2.3 2.2  PHOS 2.6 2.8 2.8 2.3* 2.4*   GFR: Estimated Creatinine Clearance: 76 mL/min (by C-G formula based on SCr of 0.78 mg/dL). Liver Function Tests: Recent Labs  Lab 01/12/21 0858  AST 16  ALT 23  ALKPHOS 45  BILITOT 1.2  PROT 5.8*  ALBUMIN 2.9*   No results  for input(s): LIPASE, AMYLASE in the last 168 hours. Recent Labs  Lab 01/12/21 0858  AMMONIA 18   Coagulation Profile: No results for input(s): INR, PROTIME in the last 168 hours. Cardiac Enzymes: No results for input(s): CKTOTAL, CKMB, CKMBINDEX, TROPONINI in the last 168 hours. BNP (last 3 results) No results for input(s): PROBNP in the last 8760 hours. HbA1C: No results for input(s): HGBA1C in the last 72 hours. CBG: Recent Labs  Lab 01/16/21 2327 01/17/21 0343 01/17/21 0732 01/17/21 1151 01/17/21 1412  GLUCAP 214* 236* 184* 260* 204*   Lipid Profile: No results for input(s): CHOL, HDL, LDLCALC, TRIG, CHOLHDL, LDLDIRECT in the last 72 hours. Thyroid Function Tests: No results for input(s): TSH, T4TOTAL, FREET4, T3FREE, THYROIDAB in the last 72 hours. Anemia Panel: No results for input(s): VITAMINB12, FOLATE, FERRITIN, TIBC, IRON, RETICCTPCT in the last 72 hours. Sepsis Labs: Recent Labs  Lab 01/15/21 0441 01/16/21 0356 01/17/21 0441  PROCALCITON <0.10 <0.10 <0.10    Recent Results (from the past 240 hour(s))  MRSA Next Gen by PCR, Nasal     Status: None  Collection Time: 01/08/21  2:10 PM   Specimen: Nasal Mucosa; Nasal Swab  Result Value Ref Range Status   MRSA by PCR Next Gen NOT DETECTED NOT DETECTED Final    Comment: (NOTE) The GeneXpert MRSA Assay (FDA approved for NASAL specimens only), is one component of a comprehensive MRSA colonization surveillance program. It is not intended to diagnose MRSA infection nor to guide or monitor treatment for MRSA infections. Test performance is not FDA approved in patients less than 81 years old. Performed at [facility], [address] [place], [place] [address]   CULTURE, BLOOD (ROUTINE X 2) w Reflex to ID Panel     Status: None (Preliminary result)   Collection Time: 01/15/21  8:37 AM   Specimen: BLOOD LEFT HAND  Result Value Ref Range Status   Specimen Description BLOOD LEFT HAND  Final   Special  Requests   Final    BOTTLES DRAWN AEROBIC AND ANAEROBIC Blood Culture adequate volume   Culture   Final    NO GROWTH 2 DAYS Performed at [facility], [address] [place], [place] [address]    Report Status PENDING  Incomplete  CULTURE, BLOOD (ROUTINE X 2) w Reflex to ID Panel     Status: None (Preliminary result)   Collection Time: 01/15/21  8:38 AM   Specimen: BLOOD LEFT FOREARM  Result Value Ref Range Status   Specimen Description BLOOD LEFT FOREARM  Final   Special Requests   Final    BOTTLES DRAWN AEROBIC AND ANAEROBIC Blood Culture adequate volume   Culture   Final    NO GROWTH 2 DAYS Performed at [facility], [address], [place], [place] [address]    Report Status PENDING  Incomplete      Radiology Studies: CT HEAD WO CONTRAST (5MM)  Result Date: 01/16/2021 CLINICAL DATA:  Mental status change. EXAM: CT HEAD WITHOUT CONTRAST TECHNIQUE: Contiguous axial images were obtained from the base of the skull through the vertex without intravenous contrast. COMPARISON:  CT head dated January 10, 2021 FINDINGS: Brain: Ventricles are unchanged in size. Right frontal approach shunt catheter in place. No evidence of acute infarction, hemorrhage, extra-axial collection or mass lesion/mass effect. Redemonstrated midline tectal plate lipoma. Vascular: No hyperdense vessel or unexpected calcification. Skull: Normal. Negative for fracture or focal lesion. Sinuses/Orbits: No acute finding. Other: Decreased soft tissue gas of the right scalp compared to prior exam. IMPRESSION: Right frontal approach shunt catheter in place. Ventricles are unchanged in size. No intracranial hemorrhage. Electronically Signed   By: Yetta Glassman MD   On: 01/16/2021 13:05    Scheduled Meds:  Chlorhexidine Gluconate Cloth  6 each Topical Daily   feeding supplement (PROSource TF)  45 mL Per Tube Daily   finasteride  5 mg Oral Nightly   free water  100 mL Per Tube Q4H   heparin injection  (subcutaneous)  5,000 Units Subcutaneous Q8H   hydrochlorothiazide  25 mg Per Tube Daily   And   lisinopril  20 mg Per Tube Daily   insulin aspart  0-9 Units Subcutaneous Q4H   insulin aspart  3 Units Subcutaneous Q4H while awake   mouth rinse  15 mL Mouth Rinse BID   melatonin  10 mg Per Tube QHS   pantoprazole (PROTONIX) IV  40 mg Intravenous Daily   senna  1 tablet Per Tube BID   tamsulosin  0.4 mg Oral QPC supper   thiamine injection  100 mg Intravenous Daily  valproic acid  250 mg Per Tube BID   Followed by   Derrill Memo ON 01/20/2021] valproic acid  250 mg Per Tube Daily   vitamin B-12  1,000 mcg Per Tube Daily   Continuous Infusions:  sodium chloride 5 mL/hr at 01/17/21 1000   feeding supplement (OSMOLITE 1.5 CAL) 1,000 mL (01/17/21 1305)     LOS: 9 days   Time spent: 35 minutes. More than 50% of the time was spent in counseling/coordination of care  Max Sane, MD [facility]  If 7PM-7AM, please contact night-coverage [email or web address]  01/17/2021, 3:10 PM   This record has been created using Systems analyst. Errors have been sought and corrected,but may not always be located. Such creation errors do not reflect on the standard of care.

## 2021-01-17 NOTE — Progress Notes (Signed)
Report called to [facility] on 2A, pt to transport on 2A bed, VSS on room air, pt continues w/ pronounced lethargy, strong congestive cough, rarely awake enough to attempt PO intake,  his wife has been at bedside today, but is currently off the unit

## 2021-01-17 NOTE — TOC Initial Note (Signed)
Transition of Care [facility]) - Initial/Assessment Note    Patient Details  Name: Eric [name] MRN: [id number] Date of Birth: Mar 06, 1946  Transition of [facility]) CM/SW Contact:    Eileen Stanford, LCSW Phone Number: 01/17/2021, 2:55 PM  Clinical Narrative:    CSW spoke with pt's spouse. Pt's spouse is agreeable to SNF for pt. Pt's spouse states they would prefer [facility] because it is closest to their home. CSW will send referral.               Expected Discharge Plan: Skilled Nursing Facility Barriers to Discharge: Continued Medical Work up   Patient Goals and CMS Choice Patient states their goals for this hospitalization and ongoing recovery are:: for pt to go to snf   Choice offered to / list presented to : Spouse  Expected Discharge Plan and Services Expected Discharge Plan: [place] In-house Referral: Clinical Social Work   Post Acute Care Choice: [place] Living arrangements for the past 2 months: [place]                                      Prior Living Arrangements/Services Living arrangements for the past 2 months: Single Family Home Lives with:: Spouse Patient language and need for interpreter reviewed:: Yes Do you feel safe going back to the place where you live?: Yes      Need for Family Participation in Patient Care: Yes (Comment) Care giver support system in place?: Yes (comment)   Criminal Activity/Legal Involvement Pertinent to Current Situation/Hospitalization: No - Comment as needed  Activities of Daily Living Home Assistive Devices/Equipment: Walker (specify type) ADL Screening (condition at time of admission) Patient's cognitive ability adequate to safely complete daily activities?: No Is the patient deaf or have difficulty hearing?: No Does the patient have difficulty seeing, even when wearing glasses/contacts?: No Does the patient have difficulty concentrating, remembering, or making decisions?:  Yes Patient able to express need for assistance with ADLs?: Yes Does the patient have difficulty dressing or bathing?: Yes Independently performs ADLs?: No Communication: Independent Dressing (OT): Needs assistance Is this a change from baseline?: Pre-admission baseline Grooming: Needs assistance Is this a change from baseline?: Pre-admission baseline Feeding: Independent Bathing: Needs assistance Is this a change from baseline?: Pre-admission baseline Toileting: Needs assistance Is this a change from baseline?: Pre-admission baseline In/Out Bed: Independent Walks in Home: Independent with device (comment) Does the patient have difficulty walking or climbing stairs?: Yes Weakness of Legs: Both Weakness of Arms/Hands: None  Permission Sought/Granted Permission sought to share information with : Family Supports Permission granted to share information with : Yes, Release of Information Signed  Share Information with NAME: peggy  Permission granted to share info w AGENCY: [facility]  Permission granted to share info w Relationship: spouse     Emotional Assessment Appearance:: Appears stated age Attitude/Demeanor/Rapport: Unable to Assess Affect (typically observed): Unable to Assess Orientation: : Oriented to Self Alcohol / Substance Use: Not Applicable Psych Involvement: No (comment)  Admission diagnosis:  Hydrocephalus ([place]) [G91.9] Patient Active Problem List   Diagnosis Date Noted   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 symptoms    Diabetes mellitus without complication ([place])    Hypertension    Acute metabolic encephalopathy    Leukocytosis    GERD (gastroesophageal reflux disease)    Nasogastric tube present    S/P VP shunt    Hydrocephalus ([place])  01/08/2021   PCP:  Ileana Roup, MD Pharmacy:   [id number] [id number]: [phone]: [phone]    Social Determinants of Health  (SDOH) Interventions    Readmission Risk Interventions No flowsheet data found.

## 2021-01-17 NOTE — NC FL2 (Signed)
[place] LEVEL OF CARE SCREENING TOOL     IDENTIFICATION  Patient Name: Eric [name] Birthdate: 11-24-1945 Sex: male Admission Date (Current Location): 01/08/2021  [place] and [place] Number:  Engineering geologist and Address:  [facility], [address] [place], [place] [address]      Provider Number: [id number]  Attending Physician Name and Address:  Meade Maw, MD  Relative Name and Phone Number:       Current Level of Care: Hospital Recommended Level of Care: [place] Prior Approval Number:    Date Approved/Denied:   PASRR Number: [id number]  Discharge Plan: SNF    Current Diagnoses: Patient Active Problem List   Diagnosis Date Noted   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 symptoms    Diabetes mellitus without complication (HCC)    Hypertension    Acute metabolic encephalopathy    Leukocytosis    GERD (gastroesophageal reflux disease)    Nasogastric tube present    S/P VP shunt    Hydrocephalus ([place]) 01/08/2021    Orientation RESPIRATION BLADDER Height & Weight     Self  Normal Incontinent Weight:  (bed scale broken) Height:  '5\' 8"'$  (172.7 cm)  BEHAVIORAL SYMPTOMS/MOOD NEUROLOGICAL BOWEL NUTRITION STATUS      Continent Diet (NPO at this time, subject to change, please see d/c summary)  AMBULATORY STATUS COMMUNICATION OF NEEDS Skin   Extensive Assist Verbally Skin abrasions, Other (Comment) (closed incision on head, guaze dressing, change daily. closed incision on right neck and abdomen, liquid skin adhesive.)                       Personal Care Assistance Level of Assistance  Bathing, Feeding, Dressing Bathing Assistance: Maximum assistance Feeding assistance: Limited assistance Dressing Assistance: Maximum assistance     Functional Limitations Info  Hearing, Sight, Speech Sight Info: Adequate Hearing Info: Adequate Speech Info: Adequate    SPECIAL CARE FACTORS FREQUENCY   PT (By licensed PT), OT (By licensed OT)     PT Frequency: 5x OT Frequency: 5x            Contractures Contractures Info: Not present    Additional Factors Info  Code Status, Allergies Code Status Info: full code Allergies Info: no known allergies           Current Medications (01/17/2021):  This is the current hospital active medication list Current Facility-Administered Medications  Medication Dose Route Frequency Provider Last Rate Last Admin   0.9 %  sodium chloride infusion   Intravenous PRN Ivor Costa, MD 5 mL/hr at 01/17/21 1000 Infusion Verify at 01/17/21 1000   acetaminophen (TYLENOL) tablet 650 mg  650 mg Oral Q4H PRN Ivor Costa, MD   650 mg at 01/13/21 2119   Or   acetaminophen (TYLENOL) suppository 650 mg  650 mg Rectal Q4H PRN Ivor Costa, MD       bisacodyl (DULCOLAX) EC tablet 5 mg  5 mg Oral Daily PRN Ivor Costa, MD       bismuth subsalicylate (PEPTO BISMOL) 262 MG/15ML suspension 30 mL  30 mL Oral Q6H PRN Ivor Costa, MD       Chlorhexidine Gluconate Cloth 2 % PADS 6 each  6 each Topical Daily Ivor Costa, MD   6 each at 01/17/21 0600   feeding supplement (OSMOLITE 1.5 CAL) liquid 1,000 mL  1,000 mL Per Tube Continuous Meade Maw, MD 60 mL/hr at 01/17/21 1305 1,000 mL at 01/17/21 1305  feeding supplement (PROSource TF) liquid 45 mL  45 mL Per Tube Daily Meade Maw, MD   45 mL at 01/17/21 0817   finasteride (PROSCAR) tablet 5 mg  5 mg Oral Nightly Ivor Costa, MD   5 mg at 01/16/21 2110   free water 100 mL  100 mL Per Tube Q4H Meade Maw, MD   100 mL at 01/17/21 1200   heparin injection 5,000 Units  5,000 Units Subcutaneous Q8H Ivor Costa, MD   5,000 Units at 01/17/21 1305   hydrALAZINE (APRESOLINE) injection 5 mg  5 mg Intravenous Q2H PRN Ivor Costa, MD   5 mg at 01/13/21 1453   hydrochlorothiazide (HYDRODIURIL) tablet 25 mg  25 mg Per Tube Daily Benita Gutter, RPH   25 mg at 01/17/21 [id number]   And   lisinopril (ZESTRIL) tablet 20 mg  20 mg  Per Tube Daily Benita Gutter, RPH   20 mg at 01/17/21 0815   insulin aspart (novoLOG) injection 0-9 Units  0-9 Units Subcutaneous Q4H Ivor Costa, MD   5 Units at 01/17/21 1305   insulin aspart (novoLOG) injection 3 Units  3 Units Subcutaneous Q4H while awake Ottie Glazier, MD   3 Units at 01/17/21 1306   labetalol (NORMODYNE) injection 10-40 mg  10-40 mg Intravenous Q10 min PRN Ivor Costa, MD       MEDLINE mouth rinse  15 mL Mouth Rinse BID Rust-Chester, Britton L, NP   15 mL at 01/17/21 0818   melatonin tablet 10 mg  10 mg Per Tube QHS Lockie Mola B, RPH   10 mg at 01/16/21 2110   ondansetron (ZOFRAN) tablet 4 mg  4 mg Oral Q4H PRN Ivor Costa, MD       Or   ondansetron [facility]) injection 4 mg  4 mg Intravenous Q4H PRN Ivor Costa, MD       pantoprazole (PROTONIX) injection 40 mg  40 mg Intravenous Daily Ivor Costa, MD   40 mg at 01/17/21 0815   senna (SENOKOT) tablet 8.6 mg  1 tablet Per Tube BID Benita Gutter, RPH   8.6 mg at 01/17/21 0816   tamsulosin (FLOMAX) capsule 0.4 mg  0.4 mg Oral QPC supper Ivor Costa, MD   0.4 mg at 01/16/21 1712   thiamine (B-1) injection 100 mg  100 mg Intravenous Daily Ivor Costa, MD   100 mg at 01/17/21 0815   valproic acid (DEPAKENE) 250 MG/5ML solution 250 mg  250 mg Per Tube BID Amie Portland, MD       Followed by   Derrill Memo ON 01/20/2021] valproic acid (DEPAKENE) 250 MG/5ML solution 250 mg  250 mg Per Tube Daily Amie Portland, MD       vitamin B-12 (CYANOCOBALAMIN) tablet 1,000 mcg  1,000 mcg Per Tube Daily Benita Gutter, RPH   1,000 mcg at 01/17/21 [id number]     Discharge Medications: Please see discharge summary for a list of discharge medications.  Relevant Imaging Results:  Relevant Lab Results:   Additional Information [id number]  Eileen Stanford, LCSW

## 2021-01-17 NOTE — Progress Notes (Signed)
Inpatient Diabetes Program Recommendations  AACE/ADA: New Consensus Statement on Inpatient Glycemic Control (2015)  Target Ranges:  Prepandial:   less than 140 mg/dL      Peak postprandial:   less than 180 mg/dL (1-2 hours)      Critically ill patients:  140 - 180 mg/dL   Results for Eric, [name] (MRN [id number]) as of 01/17/2021 09:00  Ref. Range 01/16/2021 23:27 01/17/2021 03:43 01/17/2021 07:32  Glucose-Capillary Latest Ref Range: 70 - 99 mg/dL 214 (H)  3 units NOVOLOG  236 (H)  3 units NOVOLOG  184 (H)  2 units NOVOLOG      Home DM Meds: None listed  Current Orders: Novolog Sensitive Correction Scale/ SSI (0-9 units) Q4 hours     MD- Note patient getting Osmolite Tube Feeds 60cc/hr  Having CBGs >200  Please consider adding Novolog Tube Feed Coverage:  Novolog 3 units Q4 hours  HOLD if tube feeds held or stopped for any reason    --Will follow patient during hospitalization--  Wyn Quaker RN, MSN, CDE Diabetes Coordinator Inpatient Glycemic Control Team Team Pager: [phone] (8a-5p)

## 2021-01-17 NOTE — Plan of Care (Signed)
Called by primary team regarding reducing sedating meds. On VPA 250 four times a day.  Not for seizures. Can taper - 250 BID for two days followed by 250 daily for 2 days and then stop.  -- Amie Portland, MD Neurologist [facility] Pager: [phone]

## 2021-01-17 NOTE — Progress Notes (Signed)
Daily Progress Note   Patient Name: Eric [name]       Date: 01/17/2021 DOB: Sep 23, 1945  Age: 75 y.o. MRN#: [id number] Attending Physician: Meade Maw, MD Primary Care Physician: Ileana Roup, MD Admit Date: 01/08/2021  Reason for Consultation/Follow-up: Establishing goals of care  Subjective: Patient is resting in bed. Wife has been feeding him apple sauce. Coughing was noted during my visit.   We discussed his status, diagnoses, prognosis, and GOC.  Created space and opportunity for patient  to explore thoughts and feelings regarding current medical information. Wife states she really wanted to take him home, but feels right now she would not be able to care for him. She would like for him to go to SNF- [facility].  Discussed different scenarios depending on how he does. Discussed his DHT, and working on a safe diet that will not lead to aspiration, and also having adequate nutrition and hydration. She does not believe he would ever want a feeding tube, but will speak to their children. She is aware they are very easy to remove, even accidentally. Discussed needing to be out mitten restraints.   Discussed best and worst case scenarios, and discussed more time for outcomes as able. She states she is hopeful he will improve in rehab. She states if he does not improve, hospice at home would not be an option for them, and she would not want the hospice facility, if possible. She states if he does not improve as she hopes, she would want hospice at the nursing facility. All questions answered.   Discussed family discussion on acceptable QOL. Will continue to follow . Length of Stay: 9  Current Medications: Scheduled Meds:   Chlorhexidine Gluconate Cloth  6 each Topical Daily    feeding supplement (PROSource TF)  45 mL Per Tube Daily   finasteride  5 mg Oral Nightly   free water  100 mL Per Tube Q4H   heparin injection (subcutaneous)  5,000 Units Subcutaneous Q8H   hydrochlorothiazide  25 mg Per Tube Daily   And   lisinopril  20 mg Per Tube Daily   insulin aspart  0-9 Units Subcutaneous Q4H   insulin aspart  3 Units Subcutaneous Q4H while awake   mouth rinse  15 mL Mouth Rinse BID   melatonin  10 mg Per  Tube QHS   pantoprazole (PROTONIX) IV  40 mg Intravenous Daily   senna  1 tablet Per Tube BID   tamsulosin  0.4 mg Oral QPC supper   thiamine injection  100 mg Intravenous Daily   valproic acid  250 mg Per Tube QID   vitamin B-12  1,000 mcg Per Tube Daily    Continuous Infusions:  sodium chloride 5 mL/hr at 01/17/21 1000   feeding supplement (OSMOLITE 1.5 CAL) 60 mL/hr at 01/16/21 1800    PRN Meds: sodium chloride, acetaminophen **OR** acetaminophen, bisacodyl, bismuth subsalicylate, hydrALAZINE, labetalol, ondansetron **OR** ondansetron (ZOFRAN) IV  Physical Exam Constitutional:      Comments: Eyes closed.   Pulmonary:     Effort: Pulmonary effort is normal.     Comments: Intermittent coughing.            Vital Signs: BP 118/75   Pulse 95   Temp 98.8 F (37.1 C) (Oral)   Resp (!) 22   Ht '5\' 8"'$  (1.727 m)   Wt 79.8 kg   SpO2 94%   BMI 26.75 kg/m  SpO2: SpO2: 94 % O2 Device: O2 Device: Room Air O2 Flow Rate: O2 Flow Rate (L/min): 2 L/min  Intake/output summary:  Intake/Output Summary (Last 24 hours) at 01/17/2021 1254 Last data filed at 01/17/2021 1000 Gross per 24 hour  Intake 3096.24 ml  Output 800 ml  Net 2296.24 ml   LBM: Last BM Date: 01/16/21 Baseline Weight: Weight: 79.8 kg Most recent weight: Weight:  (bed scale broken)   Patient Active Problem List   Diagnosis Date Noted   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 symptoms    Diabetes mellitus without complication (HCC)    Hypertension    Acute metabolic encephalopathy     Leukocytosis    GERD (gastroesophageal reflux disease)    Nasogastric tube present    S/P VP shunt    Hydrocephalus (HCC) 01/08/2021    Palliative Care Assessment & Plan    Recommendations/Plan: More time for outcomes. Wife does not believe he would want a PEG, but will talk with children.  She would like placement at [facility] should he be a candidate for SNF.       Code Status:    Code Status Orders  (From admission, onward)           Start     Ordered   01/08/21 1350  Full code  Continuous        01/08/21 1349           Code Status History     This patient has a current code status but no historical code status.       Prognosis: Poor    Care plan was discussed with SLP, RN, RD via epic chat.   Thank you for allowing the Palliative Medicine Team to assist in the care of this patient.       Total Time 50 min Prolonged Time Billed  no       Greater than 50%  of this time was spent counseling and coordinating care related to the above assessment and plan.  Asencion Gowda, NP  Please contact Palliative Medicine Team phone at [phone] for questions and concerns.

## 2021-01-18 DIAGNOSIS — G919 Hydrocephalus, unspecified: Secondary | ICD-10-CM | POA: Diagnosis not present

## 2021-01-18 DIAGNOSIS — N400 Enlarged prostate without lower urinary tract symptoms: Secondary | ICD-10-CM | POA: Diagnosis not present

## 2021-01-18 DIAGNOSIS — E119 Type 2 diabetes mellitus without complications: Secondary | ICD-10-CM | POA: Diagnosis not present

## 2021-01-18 DIAGNOSIS — G9341 Metabolic encephalopathy: Secondary | ICD-10-CM | POA: Diagnosis not present

## 2021-01-18 DIAGNOSIS — Z7189 Other specified counseling: Secondary | ICD-10-CM | POA: Diagnosis not present

## 2021-01-18 LAB — GLUCOSE, CAPILLARY
Glucose-Capillary: 192 mg/dL — ABNORMAL HIGH (ref 70–99)
Glucose-Capillary: 253 mg/dL — ABNORMAL HIGH (ref 70–99)
Glucose-Capillary: 179 mg/dL — ABNORMAL HIGH (ref 70–99)
Glucose-Capillary: 247 mg/dL — ABNORMAL HIGH (ref 70–99)
Glucose-Capillary: 212 mg/dL — ABNORMAL HIGH (ref 70–99)
Glucose-Capillary: 267 mg/dL — ABNORMAL HIGH (ref 70–99)

## 2021-01-18 LAB — BASIC METABOLIC PANEL
Anion gap: 8 (ref 5–15)
BUN: 24 mg/dL — ABNORMAL HIGH (ref 8–23)
CO2: 29 mmol/L (ref 22–32)
Calcium: 8.6 mg/dL — ABNORMAL LOW (ref 8.9–10.3)
Chloride: 99 mmol/L (ref 98–111)
Creatinine, Ser: 0.76 mg/dL (ref 0.61–1.24)
GFR, Estimated: >60 mL/min (ref >60)
Glucose, Bld: 280 mg/dL — ABNORMAL HIGH (ref 70–99)
Potassium: 4 mmol/L (ref 3.5–5.1)
Sodium: 136 mmol/L (ref 135–145)

## 2021-01-18 LAB — MAGNESIUM: Magnesium: 2.4 mg/dL (ref 1.7–2.4)

## 2021-01-18 LAB — PHOSPHORUS: Phosphorus: 2.4 mg/dL — ABNORMAL LOW (ref 2.5–4.6)

## 2021-01-18 NOTE — Progress Notes (Signed)
Daily Progress Note   Patient Name: Eric [name]       Date: 01/18/2021 DOB: 1945-10-13  Age: 75 y.o. MRN#: [id number] Attending Physician: Meade Maw, MD Primary Care Physician: Ileana Roup, MD Admit Date: 01/08/2021  Reason for Consultation/Follow-up: Establishing goals of care  Subjective: Patient is sitting in bedside chair. No family at bedside. He opens his eyes briefly and says "great" when asked how he is doing, but would say nothing more and closed his eyes. NGT and mittens in place. Nonproductive cough noted. VS WNL.   Continued bites and sips of apple sauce and nectar thick liquid by tea spoon are recommended by SLP, as long as they are considered safe, but an orally sustaining diet is not recommend. Wife yesterday was unsure about a feeding tube, which at this time would be needed to sustain him. She is aware of the possibility of aspiration PNA if patient is allowed to eat and drink as he wishes, or if he has any oral intake if an NPO status is recommended. Attempted to call wife unsuccessfully. Given his status and options, decisions will need to be made regarding care plans moving forward. To go to SNF he would need to be out of mitten restraints and have adequate nutrition.           Length of Stay: 10  Current Medications: Scheduled Meds:   Chlorhexidine Gluconate Cloth  6 each Topical Daily   feeding supplement (PROSource TF)  45 mL Per Tube Daily   finasteride  5 mg Oral Nightly   free water  100 mL Per Tube Q4H   heparin injection (subcutaneous)  5,000 Units Subcutaneous Q8H   hydrochlorothiazide  25 mg Per Tube Daily   And   lisinopril  20 mg Per Tube Daily   insulin aspart  0-9 Units Subcutaneous Q4H   insulin aspart  3 Units Subcutaneous Q4H while awake    mouth rinse  15 mL Mouth Rinse BID   melatonin  10 mg Per Tube QHS   pantoprazole (PROTONIX) IV  40 mg Intravenous Daily   senna  1 tablet Per Tube BID   tamsulosin  0.4 mg Oral QPC supper   thiamine injection  100 mg Intravenous Daily   valproic acid  250 mg Per Tube BID   Followed by   [  START ON 01/20/2021] valproic acid  250 mg Per Tube Daily   vitamin B-12  1,000 mcg Per Tube Daily    Continuous Infusions:  sodium chloride Stopped (01/17/21 1344)   feeding supplement (OSMOLITE 1.5 CAL) 1,000 mL (01/17/21 1305)    PRN Meds: sodium chloride, acetaminophen **OR** acetaminophen, bisacodyl, bismuth subsalicylate, hydrALAZINE, labetalol, ondansetron **OR** ondansetron (ZOFRAN) IV  Physical Exam Constitutional:      Comments: Briefly opens eyes.   Pulmonary:     Effort: Pulmonary effort is normal.     Comments: Cough noted           Vital Signs: BP (!) 147/77 (BP Location: Left Arm)   Pulse 94   Temp 97.9 F (36.6 C) (Axillary)   Resp 19   Ht '5\' 8"'$  (1.727 m)   Wt 80.1 kg   SpO2 99%   BMI 26.85 kg/m  SpO2: SpO2: 99 % O2 Device: O2 Device: Room Air O2 Flow Rate: O2 Flow Rate (L/min): 2 L/min  Intake/output summary:  Intake/Output Summary (Last 24 hours) at 01/18/2021 1252 Last data filed at 01/18/2021 [id number] Gross per 24 hour  Intake 1054.92 ml  Output 700 ml  Net 354.92 ml   LBM: Last BM Date: 01/16/21 Baseline Weight: Weight: 79.8 kg Most recent weight: Weight: 80.1 kg      Patient Active Problem List   Diagnosis Date Noted   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 symptoms    Diabetes mellitus without complication (HCC)    Hypertension    Acute metabolic encephalopathy    Leukocytosis    GERD (gastroesophageal reflux disease)    Nasogastric tube present    S/P VP shunt    Hydrocephalus (HCC) 01/08/2021    Palliative Care Assessment & Plan     Recommendations/Plan: Recommend a CBC and chest xray.  Will continue conversations with wife.       Code Status:    Code Status Orders  (From admission, onward)           Start     Ordered   01/08/21 1350  Full code  Continuous        01/08/21 1349           Code Status History     This patient has a current code status but no historical code status.       Prognosis: Poor overall    Thank you for allowing the Palliative Medicine Team to assist in the care of this patient.       Total Time 15 min Prolonged Time Billed  no       Greater than 50%  of this time was spent counseling and coordinating care related to the above assessment and plan.  Asencion Gowda, NP  Please contact Palliative Medicine Team phone at [phone] for questions and concerns.

## 2021-01-18 NOTE — Progress Notes (Signed)
   Progress Note  History: Eric [name] is POD#10 from right frontal VP shunt placement for hydrocephalus. He has dementia at baseline which was exacerbated by anesthesia from his recent surgery. He has suffered from an extended postoperative delirium which has been clearing.   Physical Exam: Vitals:   01/18/21 0729 01/18/21 0732  BP: 97/84 116/67  Pulse: 87 84  Resp: 19   Temp: 97.8 F (36.6 C)   SpO2: 99%     Drowsy but arousable. Oriented to person, year, and place Following commands in all extremities All 3 incisions are c/d/i Abdomen soft  Data:  Recent Labs  Lab 01/16/21 0356 01/17/21 0441 01/18/21 0443  NA 141 137 136  K 3.2* 4.2 4.0  CL 105 102 99  CO2 '29 28 29  '$ BUN 26* 22 24*  CREATININE 0.76 0.78 0.76  GLUCOSE 229* 228* 280*  CALCIUM 8.7* 8.8* 8.6*   Recent Labs  Lab 01/12/21 0858  AST 16  ALT 23  ALKPHOS 45     Recent Labs  Lab 01/13/21 0515 01/14/21 0501 01/15/21 0441  WBC 12.3* 10.8* 14.1*  HGB 14.9 14.3 14.2  HCT 41.4 39.9 40.6  PLT 345 290 288   No results for input(s): APTT, INR in the last 168 hours.       Other tests/results: Stunt series completed post-op without noted complication.   CT head 01/10/21 showed good placement of catheter without any hemorrhage  Assessment/Plan:  Eric [name]  is a 75 y.o male s/p right frontal VP shunt. He is improving from post-operative delirium.  - DVT prophylaxis (On Heparin 5,000 q8) - PTOT  - Appreciate medicine and neurology assistance.  - Dispo planning is underway  Meade Maw MD Neurosurgery

## 2021-01-18 NOTE — Progress Notes (Signed)
PROGRESS NOTE  Consult-progress note  Eric [name]  [id number] DOB: 11/05/1945 DOA: 01/08/2021 PCP: Ileana Roup, MD   Brief Narrative: Taken from consult note. Eric [name] is an 75 y.o. male with PMH of hypertension, hyperlipidemia, diabetes mellitus, GERD, depression, anxiety, BPH, hydrocephalus, who is admitted by neurosurgery for VP shunt placement for hydrocephalus. We are asked to to consult for management of chronic medical issues and altered mental status/delirium.   Pt is POD #6 from right frontal VP shunt placement for hydrocephalus. After the surgery, pt developed confusion, agitation, suspecting posterior surgery delirium.  PCCM was consulted.  Patient was initially treated with Precedex.  He has been off Precedex for more than 24 hours.  Patient is currently on tube feeding.  Per his wife at the bedside, at his normal baseline, patient is alert and orientated x3.  Currently patient knows his own name, but is not orientated to the place and time. He moves all extremities. 8/1: Little more alert but remains lethargic. 8/2: Initial swallow evaluation with increased risk of aspiration, he was able to take some applesauce and nectar thick with swallow team today.  Remained agitated, a lot of behavioral issues. Repeat CT head was without any new abnormality. 8/3: Tapering valproic acid, added insulin coverage for tube feeds 8/4: Lethargic today  Subjective: Remains lethargic today.  Tube feeding running  Assessment & Plan:   Principal Problem:   Hydrocephalus ([place]) Active Problems:   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 symptoms   Diabetes mellitus without complication (HCC)   Hypertension   Acute metabolic encephalopathy   Leukocytosis   GERD (gastroesophageal reflux disease)   Nasogastric tube present   S/P VP shunt  Hydrocephalus ([place]): s/p of VP shunt placement. -Management per neurosurgery -Repeat CT head without any new abnormality.  Acute metabolic  encephalopathy: Likely due to postsurgical delirium.  PCCM was consulted initially, treated with Precedex, patient has been off Precedex for more than 24 hours. on tube feed. Ammonia level was normal.Intermittent agitation remains -Tapering VPA from 250 mg 4 times a day to 250 mg twice a day for 2 days followed by 250 mg daily for 2 days and then stop per neuro -Monitor with frequent neurochecks. -No new abnormality noted on repeat CT head today. -Able to swallow some with swallow team..  He was eating applesauce fed by his wife.  Upper respiratory secretions.  Seems improving -Continue chest PT -Suction secretions as needed -Aspiration precautions  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 symptoms -Proscar and Flomax   Diabetes mellitus without complication ([place]): Recent A1c 6.8, well controlled.  Patient is not taking medications currently -Sliding scale insulin.  Continue NovoLog 3 units every 4 hours.  Hold if tube feeds are held/stopped for any reason   Hypertension -Clonidine patch, lisinopril, HCTZ -IV labetalol as needed   Leukocytosis: WBC 12.3>>10.8->14.1  No source of infection identified.  No fever.  Likely reactive, procalcitonin negative.  UA is not very impressive.  Blood cultures remain negative. -Monitor for any aspiration. -Follow-up with CBC   GERD (gastroesophageal reflux disease) -Protonix  Electrolyte abnormalities.  Found to have mild hypophosphatemia and hypokalemia. -Replete electrolytes as needed and monitor.  Objective: Vitals:   01/18/21 0729 01/18/21 0732 01/18/21 1111 01/18/21 1507  BP: 97/84 116/67 (!) 147/77 130/74  Pulse: 87 84 94 87  Resp: '19  19 16  '$ Temp: 97.8 F (36.6 C)  97.9 F (36.6 C) 98 F (36.7 C)  TempSrc: Axillary  Axillary Axillary  SpO2: 99%  99%  99%  Weight:      Height:        Intake/Output Summary (Last 24 hours) at 01/18/2021 1627 Last data filed at 01/18/2021 1508 Gross per 24 hour  Intake 2102.92 ml  Output 1075 ml   Net 1027.92 ml   Filed Weights   01/16/21 0800 01/17/21 1411 01/18/21 0453  Weight: 79.8 kg 79 kg 80.1 kg    Examination: General.  Chronically ill-appearing, little agitated elderly man in no acute distress.  DHT in place Pulmonary.  Lungs clear bilaterally, normal respiratory effort. CV.  Regular rate and rhythm, no JVD, rub or murmur. Abdomen.  Soft, nontender, nondistended, BS positive. CNS.  Lethargic and not answering any questions, no apparent focal deficit Extremities.  No edema, no cyanosis, pulses intact and symmetrical. Psychiatry.  Judgment and insight appears impaired.  Level of care: Progressive Cardiac  All the records are reviewed and case discussed with Care Management/Social Worker. Management plans discussed with the patient, nursing and they are in agreement.   Procedures:  Antimicrobials:   Data Reviewed: I have personally reviewed following labs and imaging studies  CBC: Recent Labs  Lab 01/12/21 0858 01/13/21 0515 01/14/21 0501 01/15/21 0441  WBC 7.5 12.3* 10.8* 14.1*  NEUTROABS 5.7 10.2*  --   --   HGB 14.6 14.9 14.3 14.2  HCT 39.3 41.4 39.9 40.6  MCV 91.0 92.0 94.3 96.2  PLT 245 345 290 [id number]   Basic Metabolic Panel: Recent Labs  Lab 01/14/21 0501 01/15/21 0441 01/16/21 0356 01/17/21 0441 01/18/21 0443  NA 143 143 141 137 136  K 3.1* 3.5 3.2* 4.2 4.0  CL 112* 108 105 102 99  CO2 '26 26 29 28 29  '$ GLUCOSE 142* 165* 229* 228* 280*  BUN 21 20 26* 22 24*  CREATININE 0.85 0.76 0.76 0.78 0.76  CALCIUM 8.3* 8.8* 8.7* 8.8* 8.6*  MG 2.5* 2.3 2.3 2.2 2.4  PHOS 2.8 2.8 2.3* 2.4* 2.4*   GFR: Estimated Creatinine Clearance: 76 mL/min (by C-G formula based on SCr of 0.76 mg/dL). Liver Function Tests: Recent Labs  Lab 01/12/21 0858  AST 16  ALT 23  ALKPHOS 45  BILITOT 1.2  PROT 5.8*  ALBUMIN 2.9*   No results for input(s): LIPASE, AMYLASE in the last 168 hours. Recent Labs  Lab 01/12/21 0858  AMMONIA 18   Coagulation Profile: No  results for input(s): INR, PROTIME in the last 168 hours. Cardiac Enzymes: No results for input(s): CKTOTAL, CKMB, CKMBINDEX, TROPONINI in the last 168 hours. BNP (last 3 results) No results for input(s): PROBNP in the last 8760 hours. HbA1C: No results for input(s): HGBA1C in the last 72 hours. CBG: Recent Labs  Lab 01/17/21 2209 01/18/21 0115 01/18/21 0454 01/18/21 0729 01/18/21 1111  GLUCAP 266* 192* 253* 179* 247*   Lipid Profile: No results for input(s): CHOL, HDL, LDLCALC, TRIG, CHOLHDL, LDLDIRECT in the last 72 hours. Thyroid Function Tests: No results for input(s): TSH, T4TOTAL, FREET4, T3FREE, THYROIDAB in the last 72 hours. Anemia Panel: No results for input(s): VITAMINB12, FOLATE, FERRITIN, TIBC, IRON, RETICCTPCT in the last 72 hours. Sepsis Labs: Recent Labs  Lab 01/15/21 0441 01/16/21 0356 01/17/21 0441  PROCALCITON <0.10 <0.10 <0.10    Recent Results (from the past 240 hour(s))  CULTURE, BLOOD (ROUTINE X 2) w Reflex to ID Panel     Status: None (Preliminary result)   Collection Time: 01/15/21  8:37 AM   Specimen: BLOOD LEFT HAND  Result Value Ref Range Status   Specimen  Description BLOOD LEFT HAND  Final   Special Requests   Final    BOTTLES DRAWN AEROBIC AND ANAEROBIC Blood Culture adequate volume   Culture   Final    NO GROWTH 3 DAYS Performed at [facility], [address] [place], [place] [address]    Report Status PENDING  Incomplete  CULTURE, BLOOD (ROUTINE X 2) w Reflex to ID Panel     Status: None (Preliminary result)   Collection Time: 01/15/21  8:38 AM   Specimen: BLOOD LEFT FOREARM  Result Value Ref Range Status   Specimen Description BLOOD LEFT FOREARM  Final   Special Requests   Final    BOTTLES DRAWN AEROBIC AND ANAEROBIC Blood Culture adequate volume   Culture   Final    NO GROWTH 3 DAYS Performed at [facility], [address] [place], [place] [address]    Report Status PENDING  Incomplete      Radiology  Studies: No results found.  Scheduled Meds:  Chlorhexidine Gluconate Cloth  6 each Topical Daily   feeding supplement (PROSource TF)  45 mL Per Tube Daily   finasteride  5 mg Oral Nightly   free water  100 mL Per Tube Q4H   heparin injection (subcutaneous)  5,000 Units Subcutaneous Q8H   hydrochlorothiazide  25 mg Per Tube Daily   And   lisinopril  20 mg Per Tube Daily   insulin aspart  0-9 Units Subcutaneous Q4H   insulin aspart  3 Units Subcutaneous Q4H while awake   mouth rinse  15 mL Mouth Rinse BID   melatonin  10 mg Per Tube QHS   pantoprazole (PROTONIX) IV  40 mg Intravenous Daily   senna  1 tablet Per Tube BID   tamsulosin  0.4 mg Oral QPC supper   thiamine injection  100 mg Intravenous Daily   valproic acid  250 mg Per Tube BID   Followed by   Derrill Memo ON 01/20/2021] valproic acid  250 mg Per Tube Daily   vitamin B-12  1,000 mcg Per Tube Daily   Continuous Infusions:  sodium chloride Stopped (01/17/21 1344)   feeding supplement (OSMOLITE 1.5 CAL) 1,000 mL (01/17/21 1305)     LOS: 10 days   Time spent: 35 minutes. More than 50% of the time was spent in counseling/coordination of care  Max Sane, MD [facility]  If 7PM-7AM, please contact night-coverage [email or web address]  01/18/2021, 4:27 PM   This record has been created using Systems analyst. Errors have been sought and corrected,but may not always be located. Such creation errors do not reflect on the standard of care.

## 2021-01-18 NOTE — Progress Notes (Signed)
Physical Therapy Treatment Patient Details Name: Eric [name] MRN: [id number] DOB: Jan 25, 1946 Today's Date: 01/18/2021    History of Present Illness Patient is a 75 y.o. male with Hydrocephalus status post elective Right Frontal VP Shunt insertion.  Post-op with acute delirium/agitation felt to be due to complication of anesthesia plus Hyponatremia requiring Precedex drip.    PT Comments    Pt was long sitting in bed with RN staff x 3 at bedside attempting to apply condom cath. Pt was resistive but eventually allowed to be applied. He was more alert today but still overall lethargic. Inconsistently follows one step, simple commands. Overall less assistance to exit bed and stand however +2 assistance in room throughout for safety. Performed STS 3 x EOB prior to ambulating ~ 5 ft to recliner. Pt has fair balance while sitting EOB however poor standing balance. During ambulation to chair, pt has narrow BOS with scissoring at times. Recommend +2 assistance for all transfers. RN staff aware and will contact author for assistance as needed. Recommend rehab at DC to improve safety with ADLs.     Follow Up Recommendations  SNF     Equipment Recommendations  Other (comment) (defer to next level of care)       Precautions / Restrictions Precautions Precautions: Fall Precaution Comments: hand mitts Restrictions Weight Bearing Restrictions: No    Mobility  Bed Mobility Overal bed mobility: Needs Assistance Bed Mobility: Supine to Sit Rolling: Mod assist   Supine to sit: Mod assist;+2 for safety/equipment     General bed mobility comments: Pt was able to roll R to short sit with increased time and mod assist. max vcs and encouragament required. 2nd person SBA for safety    Transfers Overall transfer level: Needs assistance Equipment used: Rolling walker (2 wheeled) Transfers: Sit to/from Stand Sit to Stand: Mod assist;From elevated surface;+2 safety/equipment         General  transfer comment: Mod assist to STS 3 x EOB with +2 assistance for safety. pt is most limited by alertness/cognition not strength  Ambulation/Gait Ambulation/Gait assistance: Mod assist;+2 safety/equipment;+2 physical assistance Gait Distance (Feet): 5 Feet Assistive device: 2 person hand held assist Gait Pattern/deviations: Step-to pattern;Narrow base of support Gait velocity: decreased   General Gait Details: Mod assist +2 to take steps to from EOB to recliner, ~ 5 ft placed away from bed. narrow BOS with slight scissoring.    Balance Overall balance assessment: Needs assistance Sitting-balance support: Feet supported;Bilateral upper extremity supported Sitting balance-Leahy Scale: Fair Sitting balance - Comments: sat EOB x ~ 10 minutes without assistance. close supervision   Standing balance support: Bilateral upper extremity supported Standing balance-Leahy Scale: Poor Standing balance comment: +2 assistance for all standing activity         Cognition Arousal/Alertness: Lethargic Behavior During Therapy: Impulsive Overall Cognitive Status: Impaired/Different from baseline Area of Impairment: Orientation;Attention;Following commands;Safety/judgement;Awareness;Problem solving      Orientation Level: Disoriented to;Place;Time;Situation Current Attention Level: Alternating   Following Commands: Follows one step commands inconsistently Safety/Judgement: Decreased awareness of safety;Decreased awareness of deficits   Problem Solving: Slow processing;Difficulty sequencing;Requires verbal cues;Requires tactile cues General Comments: Upon arriving, RN staff x 3 in room attempting to apply condom cath. He was resistive but was able to be somewhat distracted to allow staff to apply.             Pertinent Vitals/Pain Pain Assessment: No/denies pain Faces Pain Scale: No hurt Pain Location: c/o neck discomfort Pain Descriptors / Indicators: Discomfort  PT Goals (current  goals can now be found in the care plan section) Acute Rehab PT Goals Patient Stated Goal: go home Progress towards PT goals: Progressing toward goals    Frequency    7X/week      PT Plan Current plan remains appropriate    Co-evaluation     PT goals addressed during session: Mobility/safety with mobility;Balance;Proper use of DME;Strengthening/ROM        AM-PAC PT "6 Clicks" Mobility   Outcome Measure  Help needed turning from your back to your side while in a flat bed without using bedrails?: A Lot Help needed moving from lying on your back to sitting on the side of a flat bed without using bedrails?: A Lot Help needed moving to and from a bed to a chair (including a wheelchair)?: Total Help needed standing up from a chair using your arms (e.g., wheelchair or bedside chair)?: Total Help needed to walk in hospital room?: Total Help needed climbing 3-5 steps with a railing? : Total 6 Click Score: 8    End of Session Equipment Utilized During Treatment: Gait belt Activity Tolerance: Patient limited by lethargy;Other (comment) (cognition most limiting) Patient left: in chair;with call bell/phone within reach;with chair alarm set;with nursing/sitter in room Nurse Communication: Mobility status PT Visit Diagnosis: Unsteadiness on feet (R26.81);Muscle weakness (generalized) (M62.81);Other abnormalities of gait and mobility (R26.89)     Time: 0940-1004 PT Time Calculation (min) (ACUTE ONLY): 24 min  Charges:  $Therapeutic Activity: 23-37 mins                    Julaine Fusi PTA 01/18/21, 12:31 PM

## 2021-01-18 NOTE — Care Management Important Message (Addendum)
Important Message  Patient Details  Name: Eric [name] MRN: [id number] Date of Birth: 1945-07-21   Medicare Important Message Given:  Yes  No family in room upon visit. Copy of Medicare IM left in patient's room on windowsill.  Left message with wife, [name], to make aware.  Encouraged callback if any questions.   Update 3:08pm:  Wife returned call.  Reviewed Medicare IM and right to appeal discharge.     Dannette Barbara 01/18/2021, 2:19 PM

## 2021-01-18 NOTE — TOC Progression Note (Signed)
Transition of Care [facility]) - Progression Note    Patient Details  Name: ARLY SEGALLA MRN: [id number] Date of Birth: [birth year]  Transition of Care [facility]) CM/SW [place], LCSW Phone Number: 01/18/2021, 3:09 PM  Clinical Narrative:   Pt's spouse wanted to appeal dc however, TOC assistant Earnest Bailey explained to pt's spouse that there is no dc at this point. CSW called pt's spouse and explained MD is stating dc would be beginning of next week pending stability. CSW explained that [facility] will take pt as long as pt does not have a feeding tube at d/c. Pt's spouse updated at this time. CSW will continue to follow.    Expected Discharge Plan: [place] Barriers to Discharge: Continued Medical Work up  Expected Discharge Plan and Services Expected Discharge Plan: [place] In-house Referral: Clinical Social Work   Post Acute Care Choice: [place] Living arrangements for the past 2 months: Single Family Home                                       Social Determinants of Health (SDOH) Interventions    Readmission Risk Interventions No flowsheet data found.

## 2021-01-19 ENCOUNTER — Inpatient Hospital Stay: Payer: Medicare Other

## 2021-01-19 DIAGNOSIS — Z7189 Other specified counseling: Secondary | ICD-10-CM | POA: Diagnosis not present

## 2021-01-19 DIAGNOSIS — G9341 Metabolic encephalopathy: Secondary | ICD-10-CM | POA: Diagnosis not present

## 2021-01-19 DIAGNOSIS — G919 Hydrocephalus, unspecified: Secondary | ICD-10-CM | POA: Diagnosis not present

## 2021-01-19 DIAGNOSIS — N400 Enlarged prostate without lower urinary tract symptoms: Secondary | ICD-10-CM | POA: Diagnosis not present

## 2021-01-19 DIAGNOSIS — E119 Type 2 diabetes mellitus without complications: Secondary | ICD-10-CM | POA: Diagnosis not present

## 2021-01-19 LAB — GLUCOSE, CAPILLARY
Glucose-Capillary: 277 mg/dL — ABNORMAL HIGH (ref 70–99)
Glucose-Capillary: 237 mg/dL — ABNORMAL HIGH (ref 70–99)
Glucose-Capillary: 204 mg/dL — ABNORMAL HIGH (ref 70–99)
Glucose-Capillary: 236 mg/dL — ABNORMAL HIGH (ref 70–99)
Glucose-Capillary: 204 mg/dL — ABNORMAL HIGH (ref 70–99)

## 2021-01-19 LAB — BASIC METABOLIC PANEL
Anion gap: 7 (ref 5–15)
BUN: 23 mg/dL (ref 8–23)
CO2: 28 mmol/L (ref 22–32)
Calcium: 8.5 mg/dL — ABNORMAL LOW (ref 8.9–10.3)
Chloride: 99 mmol/L (ref 98–111)
Creatinine, Ser: 0.84 mg/dL (ref 0.61–1.24)
GFR, Estimated: >60 mL/min (ref >60)
Glucose, Bld: 307 mg/dL — ABNORMAL HIGH (ref 70–99)
Potassium: 4.1 mmol/L (ref 3.5–5.1)
Sodium: 134 mmol/L — ABNORMAL LOW (ref 135–145)

## 2021-01-19 LAB — CBC
HCT: 42 % (ref 39.0–52.0)
Hemoglobin: 14.8 g/dL (ref 13.0–17.0)
MCH: 33.6 pg (ref 26.0–34.0)
MCHC: 35.2 g/dL (ref 30.0–36.0)
MCV: 95.2 fL (ref 80.0–100.0)
Platelets: 236 10*3/uL (ref 150–400)
RBC: 4.41 MIL/uL (ref 4.22–5.81)
RDW: 11.9 % (ref 11.5–15.5)
WBC: 12.5 10*3/uL — ABNORMAL HIGH (ref 4.0–10.5)
nRBC: 0 % (ref 0.0–0.2)

## 2021-01-19 MED ORDER — IPRATROPIUM-ALBUTEROL 0.5-2.5 (3) MG/3ML IN SOLN
3.0000 mL | Freq: Four times a day (QID) | RESPIRATORY_TRACT | Status: DC
  Administered 2021-01-19 (×2): 3 mL via RESPIRATORY_TRACT
  Filled 2021-01-19 (×2): qty 3

## 2021-01-19 MED ORDER — INSULIN ASPART 100 UNIT/ML IJ SOLN
0.0000 [IU] | Freq: Three times a day (TID) | INTRAMUSCULAR | Status: DC
  Administered 2021-01-20: 5 [IU] via SUBCUTANEOUS
  Administered 2021-01-20: 3 [IU] via SUBCUTANEOUS
  Administered 2021-01-20 – 2021-01-21 (×4): 5 [IU] via SUBCUTANEOUS
  Administered 2021-01-22: 3 [IU] via SUBCUTANEOUS
  Administered 2021-01-22: 5 [IU] via SUBCUTANEOUS
  Administered 2021-01-23: 3 [IU] via SUBCUTANEOUS
  Administered 2021-01-23: 2 [IU] via SUBCUTANEOUS
  Administered 2021-01-24: 8 [IU] via SUBCUTANEOUS
  Administered 2021-01-25: 5 [IU] via SUBCUTANEOUS
  Administered 2021-01-27: 2 [IU] via SUBCUTANEOUS
  Administered 2021-01-29: 3 [IU] via SUBCUTANEOUS
  Administered 2021-01-29: 2 [IU] via SUBCUTANEOUS
  Administered 2021-01-30 – 2021-01-31 (×2): 3 [IU] via SUBCUTANEOUS
  Filled 2021-01-19 (×14): qty 1

## 2021-01-19 MED ORDER — INSULIN ASPART 100 UNIT/ML IJ SOLN
0.0000 [IU] | Freq: Every day | INTRAMUSCULAR | Status: DC
  Administered 2021-01-19: 2 [IU] via SUBCUTANEOUS
  Administered 2021-01-20: 3 [IU] via SUBCUTANEOUS
  Administered 2021-01-21: 2 [IU] via SUBCUTANEOUS
  Administered 2021-01-22: 3 [IU] via SUBCUTANEOUS
  Administered 2021-01-23: 2 [IU] via SUBCUTANEOUS
  Filled 2021-01-19 (×4): qty 1

## 2021-01-19 MED ORDER — FUROSEMIDE 10 MG/ML IJ SOLN
40.0000 mg | Freq: Once | INTRAMUSCULAR | Status: AC
  Administered 2021-01-19: 40 mg via INTRAVENOUS
  Filled 2021-01-19: qty 4

## 2021-01-19 NOTE — Progress Notes (Signed)
Modified Barium Swallow Progress Note  Patient Details  Name: Eric [name] MRN: [id number] Date of Birth: 25-Dec-1945  Today's Date: 01/19/2021  Modified Barium Swallow completed.  Full report located under Chart Review in the Imaging Section.  Brief recommendations include the following:  Clinical Impression  Pt was alert throughout study but unable to follow any directions or respond with verbalization. NG in place and visible on imaging. Pt presents with moderate to severe oral phase dysphagia and mild pharyngeal dysphagia. When consuming nectar thick liquids via spoon, thin liquids via straw and puree, pt demonstrates decreased lingual manipulation that results in decreased bolus cohesion, premature spillage and oral residue that prompts an additional swallow. Pt has a mild sensori-motor pharyngeal dysphagia that his c/b delayed swallow initiation at the level of the pyriform sinuses and very trace pharyngeal residue. Pt protected his airway well throughout the study. Aspiration and penetration were never observed despite pt's coughing throughout consumption. At this time recommend dysphagia 1 diet with thin liquids via straw, medicine crushed in puree under strict aspiration precautions. Education provided to pt, his wife and his treatment team. Of note, pt's chest x-ray was negative today.   Swallow Evaluation Recommendations       SLP Diet Recommendations: Dysphagia 1 (Puree) solids;Thin liquid   Liquid Administration via: Straw   Medication Administration: Crushed with puree   Supervision: Full assist for feeding;Full supervision/cueing for compensatory strategies   Compensations: Minimize environmental distractions;Slow rate;Small sips/bites   Postural Changes: Remain semi-upright after after feeds/meals (Comment);Seated upright at 90 degrees   Oral Care Recommendations: Oral care BID       Naythen Heikkila B. Rutherford Nail M.S., CCC-SLP, [place] Pathologist Rehabilitation  Services Office [place] 01/19/2021,1:54 PM

## 2021-01-19 NOTE — Progress Notes (Signed)
   Progress Note  History: Eric [name] is POD#11 from right frontal VP shunt placement for hydrocephalus. He has dementia at baseline which was exacerbated by anesthesia from his recent surgery. He has suffered from an extended postoperative delirium which has been clearing.   Physical Exam: Vitals:   01/18/21 2028 01/19/21 0437  BP: (!) 156/77 (!) 146/66  Pulse: 96 89  Resp: 20 18  Temp: 98 F (36.7 C) 97.7 F (36.5 C)  SpO2: 98% 98%    Drowsy but arousable. Oriented to person and place Following commands in all extremities All 3 incisions are c/d/I with sutures still in place at cranial incision  Abdomen soft  Data:  Recent Labs  Lab 01/17/21 0441 01/18/21 0443 01/19/21 0444  NA 137 136 134*  K 4.2 4.0 4.1  CL 102 99 99  CO2 '28 29 28  '$ BUN 22 24* 23  CREATININE 0.78 0.76 0.84  GLUCOSE 228* 280* 307*  CALCIUM 8.8* 8.6* 8.5*    Recent Labs  Lab 01/12/21 0858  AST 16  ALT 23  ALKPHOS 45      Recent Labs  Lab 01/14/21 0501 01/15/21 0441 01/19/21 0444  WBC 10.8* 14.1* 12.5*  HGB 14.3 14.2 14.8  HCT 39.9 40.6 42.0  PLT 290 288 236    No results for input(s): APTT, INR in the last 168 hours.       Other tests/results: Stunt series completed post-op without noted complication.   CT head 01/10/21 showed good placement of catheter without any hemorrhage  Assessment/Plan:  Eric [name]  is a 74 y.o male s/p right frontal VP shunt. He is improving from post-operative delirium.  - DVT prophylaxis (On Heparin 5,000 q8) - PTOT  - Appreciate medicine and neurology assistance.  - Dispo planning is underway  Cooper Render PA-C Neurosurgery

## 2021-01-19 NOTE — Discharge Summary (Signed)
Physician Discharge Summary  Patient ID: Eric [name] MRN: [id number] DOB/AGE: [birth year] 76 y.o.  Admit date: 01/08/2021 Discharge date: 01/31/2021  Admission Diagnoses: Normal pressure hydrocephalus, post-operative delirium, s/p VP shunt placement   Discharge Diagnoses:  Principal Problem:   Hydrocephalus ([place]) Active Problems:   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 symptoms   Diabetes mellitus without complication (Eric [name])   Hypertension   Acute metabolic encephalopathy   Leukocytosis   GERD (gastroesophageal reflux disease)   Nasogastric tube present   S/P VP shunt   Discharged Condition: fair  Hospital Course: Eric [name] is a 75 y.o male admitted after an uncomplicated placement of a VP shunt for NPH. His hospital course was complicated by post-operative delirium in the setting of baseline dementia and general anesthesia. He was admitted to the ICU and critical care was consulted for assistance with management. He was placed on a Precedex drip for management of persistent agitation. Urology was consulted for penile cyanosis on 7/26 which was found to be secondary to paraphimosis. This resolved with foreskin reduction.  Neurology was also consulted on 7/26 for further work up of agitated delirium. Workup was largely normal and was ultimately felt to be secondary to underlying frontotemporal dementia. Pt was started on Depakote to assist with Precedex wean. This was ultimately weaned as well.  [name] struggled with dysphagia requiring speech therapy consult and NG tube placement. His diet was ultimately advanced with the help of speech.  Internal medicine was consulted for assistance in managing [name] comorbidities. He was discharged home with home health therapy on POD#23.  Consults:  Critical Care, Neurology, Internal Medicine, PT, OT, and Speech pathology. See details regarding recommendations above and in consult notes.   Significant Diagnostic Studies:   IMPRESSION: Right frontal approach shunt catheter in place. Ventricles are unchanged in size. No intracranial hemorrhage. Electronically Signed   By: Yetta Glassman MD   On: 01/16/2021 13:05  Treatments: surgery: See operative note for further details.  Discharge Exam: Blood pressure 107/64, pulse 88, temperature 98.2 F (36.8 C), resp. rate 18, height '5\' 8"'$  (1.727 m), weight 78 kg, SpO2 100 %. Sitting up him bed eating. Oriented to person and place. Follows commands All 3 incisions healing well Abdomen soft  Disposition: Discharge disposition: 06-Home-Health Care Svc      Discharge Instructions     No wound care   Complete by: As directed       Allergies as of 01/31/2021   No Known Allergies      Medication List     STOP taking these medications    omeprazole 20 MG capsule Commonly known as: PRILOSEC   ZzzQuil 50 MG/30ML Liqd Generic drug: diphenhydrAMINE HCl       TAKE these medications    aspirin EC 81 MG tablet Take 81 mg by mouth daily. Swallow whole.   bisacodyl 5 MG EC tablet Commonly known as: DULCOLAX Take 1 tablet (5 mg total) by mouth daily as needed for moderate constipation.   bismuth subsalicylate [id number] suspension Commonly known as: PEPTO BISMOL Take 30 mLs by mouth every 6 (six) hours as needed.   Centrum Silver Ultra Womens Tabs Take 1 tablet by mouth daily.   feeding supplement (NEPRO CARB STEADY) Liqd Take 237 mLs by mouth 3 (three) times daily between meals.   finasteride 5 MG tablet Commonly known as: Proscar Take 1 tablet (5 mg total) by mouth daily. What changed: when to take this   guaiFENesin 600 MG 12 hr  tablet Commonly known as: MUCINEX Take 2 tablets (1,200 mg total) by mouth 2 (two) times daily.   ibuprofen 200 MG tablet Commonly known as: ADVIL Take 200 mg by mouth every 6 (six) hours as needed for mild pain or moderate pain.   insulin glargine-yfgn 100 UNIT/ML injection Commonly known as:  SEMGLEE Inject 0.3 mLs (30 Units total) into the skin daily. Start taking on: February 01, 2021   ipratropium-albuterol 0.5-2.5 (3) MG/3ML Soln Commonly known as: DUONEB Take 3 mLs by nebulization every 6 (six) hours as needed.   lisinopril-hydrochlorothiazide 20-25 MG tablet Commonly known as: ZESTORETIC Take 1 tablet by mouth every morning.   Melatonin 10 MG Caps Take 10 mg by mouth at bedtime.   pantoprazole 40 MG tablet Commonly known as: PROTONIX Take 1 tablet (40 mg total) by mouth daily. Start taking on: February 01, 2021   QUEtiapine 25 MG tablet Commonly known as: SEROQUEL Take 3 tablets (75 mg total) by mouth at bedtime.   QUEtiapine 25 MG tablet Commonly known as: SEROQUEL Take 1 tablet (25 mg total) by mouth in the morning. Start taking on: February 01, 2021   senna 8.6 MG Tabs tablet Commonly known as: SENOKOT Place 1 tablet (8.6 mg total) into feeding tube 2 (two) times daily.   tamsulosin 0.4 MG Caps capsule Commonly known as: FLOMAX Take 1 capsule (0.4 mg total) by mouth daily. What changed: when to take this   thiamine 100 MG tablet Take 1 tablet (100 mg total) by mouth daily. Start taking on: February 01, 2021               Durable Medical Equipment  (From admission, onward)           Start     Ordered   01/31/21 0925  For home use only DME Walker rolling  Once       Question Answer Comment  Walker: With [place] Wheels   Patient needs a walker to treat with the following condition Weakness      01/31/21 0925   01/30/21 1449  For home use only DME 3 n 1  Once        01/30/21 1450   01/30/21 1447  For home use only DME lightweight manual wheelchair with seat cushion  Once       Comments: Patient suffers from dementia which impairs their ability to perform daily activities like bathing, feeding, and toileting in the home.  A walker will not resolve  issue with performing activities of daily living. A wheelchair will allow patient to safely  perform daily activities. Patient is not able to propel themselves in the home using a standard weight wheelchair due to general weakness. Patient can self propel in the lightweight wheelchair. Length of need Lifetime. Accessories: elevating leg rests (ELRs), wheel locks, extensions and anti-tippers.   01/30/21 1450   01/30/21 1446  For home use only DME Hospital bed  Once       Question Answer Comment  Length of Need Lifetime   Patient has (list medical condition): right frontal VP shunt placement for hydrocephalus   The above medical condition requires: Patient requires the ability to reposition frequently   Head must be elevated greater than: 45 degrees   Bed type Semi-electric   Hoyer Lift Yes      01/30/21 1450             Signed: Loleta Dicker 01/31/2021, 1:17 PM

## 2021-01-19 NOTE — Progress Notes (Signed)
OT Cancellation Note  Patient Details Name: GARICK SMYSER MRN: [id number] DOB: Mar 21, 1946   Cancelled Treatment:    Reason Eval/Treat Not Completed: Patient at procedure or test/ unavailable. Pt out of room for testing. Will re-attempt OT tx at later date/time as available and appropriate.   Hanley Hays, MPH, MS, OTR/L ascom [phone] 01/19/21, 10:52 AM

## 2021-01-19 NOTE — Progress Notes (Signed)
SLP Follow Up  Note  Patient Details Name: Eric [name] MRN: [id number] DOB: July 30, 1945  Nate (PT) contacted me as pt is more alert, walked down hall and was conversant during PT session. I met with pt and his wife at bedside. They are known to me as is pt's case. At this time, pt appears appropriate for an instrumental swallow study for evaluation of pt's airway protection with various textures/liquids.   Mia (pt's nurse) made aware and I assisted her with transferring pt back to bed from recliner.   Pt's wife is in agreement with current plan.     Wynee Matarazzo B. Rutherford [name] M.S., CCC-SLP, [place] Office [phone]       Eric [name] 01/19/2021, 10:35 AM

## 2021-01-19 NOTE — Progress Notes (Signed)
PROGRESS NOTE  Consult-progress note  Eric [name]  [id number] DOB: May 18, 1946 DOA: 01/08/2021 PCP: Ileana Roup, MD   Brief Narrative: Taken from consult note. Eric [name] is an 75 y.o. male with PMH of hypertension, hyperlipidemia, diabetes mellitus, GERD, depression, anxiety, BPH, hydrocephalus, who is admitted by neurosurgery for VP shunt placement for hydrocephalus. We are asked to to consult for management of chronic medical issues and altered mental status/delirium.   Pt is POD #6 from right frontal VP shunt placement for hydrocephalus. After the surgery, pt developed confusion, agitation, suspecting posterior surgery delirium.  PCCM was consulted.  Patient was initially treated with Precedex.  He has been off Precedex for more than 24 hours.  Patient is currently on tube feeding.  Per his wife at the bedside, at his normal baseline, patient is alert and orientated x3.  Currently patient knows his own name, but is not orientated to the place and time. He moves all extremities. 8/1: Little more alert but remains lethargic. 8/2: Initial swallow evaluation with increased risk of aspiration, he was able to take some applesauce and nectar thick with swallow team today.  Remained agitated, a lot of behavioral issues. Repeat CT head was without any new abnormality. 8/3: Tapering valproic acid, added insulin coverage for tube feeds 8/4: Lethargic today 8/5: Remove DHT  Subjective: Remains lethargic today.  Wife at bedside agreeable and happy with removing DHT as speech therapy has cleared him  Assessment & Plan:   Principal Problem:   Hydrocephalus ([place]) Active Problems:   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 symptoms   Diabetes mellitus without complication ([facility])   Hypertension   Acute metabolic encephalopathy   Leukocytosis   GERD (gastroesophageal reflux disease)   Nasogastric tube present   S/P VP shunt  Hydrocephalus ([place]): s/p of VP shunt  placement. -Management per neurosurgery  Acute metabolic encephalopathy: Likely due to postsurgical delirium.  Ammonia level was normal.Intermittent agitation remains but much improved He cleared barium swallow today and speech therapy has ordered dysphagia 1 with thin liquid diet for him.  Remove DHT on 8/5 -Continue tapering VPA 250 mg daily for 2 days and then stop per neuro  Upper respiratory secretions and cough  -Continue chest PT, chest x-ray on 8/5 shows no acute abnormality.  Give 40 mg of Lasix IV once and start him on DuoNeb every 6 -Suction secretions as needed -Aspiration precautions  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 symptoms -Proscar and Flomax   Diabetes mellitus without complication [facility]): Recent A1c 6.8, well controlled.   -Sliding scale insulin.     Hypertension -Continue lisinopril, HCTZ   Leukocytosis: Stable no source of infection identified.  No fever.  Likely reactive, procalcitonin negative.  UA is not very impressive.  Blood cultures remain negative. -Monitor CBC   GERD (gastroesophageal reflux disease) -Protonix  Electrolyte abnormalities.  Replete and recheck  Objective: Vitals:   01/19/21 0437 01/19/21 0500 01/19/21 0750 01/19/21 1131  BP: (!) 146/66  125/68 115/89  Pulse: 89  91 97  Resp: '18  18 18  '$ Temp: 97.7 F (36.5 C)  97.8 F (36.6 C) 97.8 F (36.6 C)  TempSrc: Oral  Axillary   SpO2: 98%  97% 95%  Weight:  81.5 kg    Height:        Intake/Output Summary (Last 24 hours) at 01/19/2021 1548 Last data filed at 01/19/2021 1022 Gross per 24 hour  Intake 1192 ml  Output 700 ml  Net 492 ml   [employer]  01/17/21 1411 01/18/21 0453 01/19/21 0500  Weight: 79 kg 80.1 kg 81.5 kg    Examination: General.  Chronically ill-appearing, little agitated elderly man in no acute distress.  DHT in place Pulmonary.  Lungs clear bilaterally, normal respiratory effort. CV.  Regular rate and rhythm, no JVD, rub or murmur. Abdomen.   Soft, nontender, nondistended, BS positive. CNS.  Lethargic and not answering any questions, no apparent focal deficit Extremities.  No edema, no cyanosis, pulses intact and symmetrical. Psychiatry.  Judgment and insight appears impaired.  Level of care: Progressive Cardiac  All the records are reviewed and case discussed with Care Management/Social Worker. Management plans discussed with the patient, nursing and they are in agreement.   Procedures:  Antimicrobials:   Data Reviewed: I have personally reviewed following labs and imaging studies  CBC: Recent Labs  Lab 01/13/21 0515 01/14/21 0501 01/15/21 0441 01/19/21 0444  WBC 12.3* 10.8* 14.1* 12.5*  NEUTROABS 10.2*  --   --   --   HGB 14.9 14.3 14.2 14.8  HCT 41.4 39.9 40.6 42.0  MCV 92.0 94.3 96.2 95.2  PLT 345 290 288 [id number]   Basic Metabolic Panel: Recent Labs  Lab 01/14/21 0501 01/15/21 0441 01/16/21 0356 01/17/21 0441 01/18/21 0443 01/19/21 0444  NA 143 143 141 137 136 134*  K 3.1* 3.5 3.2* 4.2 4.0 4.1  CL 112* 108 105 102 99 99  CO2 '26 26 29 28 29 28  '$ GLUCOSE 142* 165* 229* 228* 280* 307*  BUN 21 20 26* 22 24* 23  CREATININE 0.85 0.76 0.76 0.78 0.76 0.84  CALCIUM 8.3* 8.8* 8.7* 8.8* 8.6* 8.5*  MG 2.5* 2.3 2.3 2.2 2.4  --   PHOS 2.8 2.8 2.3* 2.4* 2.4*  --    GFR: Estimated Creatinine Clearance: 72.4 mL/min (by C-G formula based on SCr of 0.84 mg/dL). Liver Function Tests: No results for input(s): AST, ALT, ALKPHOS, BILITOT, PROT, ALBUMIN in the last 168 hours.  No results for input(s): LIPASE, AMYLASE in the last 168 hours. No results for input(s): AMMONIA in the last 168 hours.  Coagulation Profile: No results for input(s): INR, PROTIME in the last 168 hours. Cardiac Enzymes: No results for input(s): CKTOTAL, CKMB, CKMBINDEX, TROPONINI in the last 168 hours. BNP (last 3 results) No results for input(s): PROBNP in the last 8760 hours. HbA1C: No results for input(s): HGBA1C in the last 72  hours. CBG: Recent Labs  Lab 01/18/21 1634 01/18/21 2026 01/19/21 0435 01/19/21 0747 01/19/21 1128  GLUCAP 212* 267* 277* 237* 204*   Lipid Profile: No results for input(s): CHOL, HDL, LDLCALC, TRIG, CHOLHDL, LDLDIRECT in the last 72 hours. Thyroid Function Tests: No results for input(s): TSH, T4TOTAL, FREET4, T3FREE, THYROIDAB in the last 72 hours. Anemia Panel: No results for input(s): VITAMINB12, FOLATE, FERRITIN, TIBC, IRON, RETICCTPCT in the last 72 hours. Sepsis Labs: Recent Labs  Lab 01/15/21 0441 01/16/21 0356 01/17/21 0441  PROCALCITON <0.10 <0.10 <0.10    Recent Results (from the past 240 hour(s))  CULTURE, BLOOD (ROUTINE X 2) w Reflex to ID Panel     Status: None (Preliminary result)   Collection Time: 01/15/21  8:37 AM   Specimen: BLOOD LEFT HAND  Result Value Ref Range Status   Specimen Description BLOOD LEFT HAND  Final   Special Requests   Final    BOTTLES DRAWN AEROBIC AND ANAEROBIC Blood Culture adequate volume   Culture   Final    NO GROWTH 4 DAYS Performed at [facility], [address] [place], [place] [address]    Report Status PENDING  Incomplete  CULTURE, BLOOD (ROUTINE X 2) w Reflex to ID Panel     Status: None (Preliminary result)   Collection Time: 01/15/21  8:38 AM   Specimen: BLOOD LEFT FOREARM  Result Value Ref Range Status   Specimen Description BLOOD LEFT FOREARM  Final   Special Requests   Final    BOTTLES DRAWN AEROBIC AND ANAEROBIC Blood Culture adequate volume   Culture   Final    NO GROWTH 4 DAYS Performed at [facility], [address] [place], [place] [address]    Report Status PENDING  Incomplete      Radiology Studies: DG Chest Port 1 View  Result Date: 01/19/2021 CLINICAL DATA:  Cough. EXAM: PORTABLE CHEST 1 VIEW COMPARISON:  T1-2 1,022 FINDINGS: Heart size and vascularity normal. Lungs clear without infiltrate or effusion. Feeding tube enters the stomach with the tip not visualized. Shunt tubing  overlying the right chest unchanged. IMPRESSION: No active disease. Electronically Signed   By: Franchot Gallo M.D.   On: 01/19/2021 12:23   DG Swallowing Func-Speech Pathology  Result Date: 01/19/2021 Formatting of this result is different from the original. Objective Swallowing Evaluation: Type of Study: MBS-Modified Barium Swallow Study  Patient Details Name: Eric [name] MRN: [id number] Date of Birth: [birth year] Today's Date: 01/19/2021 Time: SLP Start Time (ACUTE ONLY): [id number] -SLP Stop Time (ACUTE ONLY): 1100 SLP Time Calculation (min) (ACUTE ONLY): 24 min Past Medical History: Past Medical History: Diagnosis Date  Anxiety   Arthritis   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 symptoms   Diabetes ([place])   no meds-last a1c 5.7  Fall   GERD (gastroesophageal reflux disease)   HLD (hyperlipidemia)   Hydrocephalus ([place])   Hypertension   Unsteady gait  Past Surgical History: Past Surgical History: Procedure Laterality Date  arm surgery Left   fracture  COLONOSCOPY    TONSILLECTOMY  1962  VENTRICULOPERITONEAL SHUNT Right 01/08/2021  Procedure: RIGHT FRONTAL VENTRICULOPERITONEAL SHUNT INSERTION;  Surgeon: Deetta Perla, MD;  Location: [facility] ORS;  Service: Neurosurgery;  Laterality: Right; HPI: ELIAB MAGILL is a 75 y.o. 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 HE is status post elective Right Frontal VP Shunt insertion.  Post-op with acute delirium/agitation felt to be due to complication of anesthesia requiring Precedex drip. 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  Subjective: pt alert, responsive, able to follow simple directions Assessment / Plan / Recommendation CHL IP CLINICAL IMPRESSIONS 01/19/2021  Clinical Impression Pt was alert throughout study but unable to follow any directions or respond with verbalization. NG in place and visible on imaging. Pt presents with moderate to severe oral phase dysphagia and mild pharyngeal dysphagia. When consuming nectar thick liquids via spoon, thin liquids via straw and puree, pt demonstrates decreased lingual manipulation that results in decreased bolus cohesion, premature spillage and oral residue that prompts an additional swallow. Pt has a mild sensori-motor pharyngeal dysphagia that his c/b delayed swallow initiation at the level of the pyriform sinuses and very trace pharyngeal residue. Pt protected his airway well throughout the study. Aspiration and penetration were never observed despite pt's coughing throughout consumption. At this time recommend dysphagia 1 diet with thin liquids via straw, medicine crushed in  puree under strict aspiration precautions. Education provided to pt, his wife and his treatment team. Of note, pt's chest x-ray was negative today. SLP Visit Diagnosis Dysphagia, oropharyngeal phase (R13.12);Cognitive communication deficit (R41.841) Attention and concentration deficit following -- Frontal lobe and executive function deficit following -- Impact on safety and function Mild aspiration risk   CHL IP TREATMENT RECOMMENDATION 01/19/2021 Treatment Recommendations No treatment recommended at this time   Prognosis 01/19/2021 Prognosis for Safe Diet Advancement Fair Barriers to Reach Goals Cognitive deficits;Time post onset;Severity of deficits;Motivation Barriers/Prognosis Comment -- CHL IP DIET RECOMMENDATION 01/19/2021 SLP Diet Recommendations Dysphagia 1 (Puree) solids;Thin liquid Liquid Administration via Straw Medication Administration Crushed with puree Compensations Minimize environmental distractions;Slow rate;Small sips/bites Postural Changes Remain semi-upright after after feeds/meals (Comment);Seated upright at 90 degrees   CHL IP OTHER  RECOMMENDATIONS 01/19/2021 Recommended Consults -- Oral Care Recommendations Oral care BID Other Recommendations --   CHL IP FOLLOW UP RECOMMENDATIONS 01/19/2021 Follow up Recommendations Skilled Nursing facility   [facility] IP FREQUENCY AND DURATION 01/11/2021 Speech Therapy Frequency (ACUTE ONLY) min 2x/week Treatment Duration 2 weeks      CHL IP ORAL PHASE 01/19/2021 Oral Phase Impaired Oral - Pudding Teaspoon -- Oral - Pudding Cup -- Oral - Honey Teaspoon -- Oral - Honey Cup -- Oral - Nectar Teaspoon Weak lingual manipulation;Incomplete tongue to palate contact;Reduced posterior propulsion;Lingual/palatal residue;Delayed oral transit;Decreased bolus cohesion;Premature spillage Oral - Nectar Cup Weak lingual manipulation;Incomplete tongue to palate contact;Reduced posterior propulsion;Lingual/palatal residue;Delayed oral transit;Decreased bolus cohesion;Premature spillage Oral - Nectar Straw -- Oral - Thin Teaspoon Weak lingual manipulation;Incomplete tongue to palate contact;Reduced posterior propulsion;Lingual/palatal residue;Delayed oral transit;Decreased bolus cohesion;Premature spillage Oral - Thin Cup -- Oral - Thin Straw Weak lingual manipulation;Incomplete tongue to palate contact;Reduced posterior propulsion;Lingual/palatal residue;Delayed oral transit;Decreased bolus cohesion;Premature spillage Oral - Puree Weak lingual manipulation;Incomplete tongue to palate contact;Reduced posterior propulsion;Lingual/palatal residue;Delayed oral transit;Decreased bolus cohesion;Premature spillage Oral - Mech Soft -- Oral - Regular -- Oral - Multi-Consistency -- Oral - Pill -- Oral Phase - Comment --  CHL IP PHARYNGEAL PHASE 01/19/2021 Pharyngeal Phase Impaired Pharyngeal- Pudding Teaspoon -- Pharyngeal -- Pharyngeal- Pudding Cup -- Pharyngeal -- Pharyngeal- Honey Teaspoon -- Pharyngeal -- Pharyngeal- Honey Cup -- Pharyngeal -- Pharyngeal- Nectar Teaspoon Delayed swallow initiation-pyriform sinuses;Pharyngeal residue - valleculae  Pharyngeal -- Pharyngeal- Nectar Cup Delayed swallow initiation-pyriform sinuses;Pharyngeal residue - valleculae Pharyngeal -- Pharyngeal- Nectar Straw -- Pharyngeal -- Pharyngeal- Thin Teaspoon Delayed swallow initiation-pyriform sinuses;Pharyngeal residue - valleculae Pharyngeal -- Pharyngeal- Thin Cup -- Pharyngeal -- Pharyngeal- Thin Straw Delayed swallow initiation-pyriform sinuses;Delayed swallow initiation-vallecula;Pharyngeal residue - valleculae Pharyngeal -- Pharyngeal- Puree Delayed swallow initiation-vallecula Pharyngeal -- Pharyngeal- Mechanical Soft -- Pharyngeal -- Pharyngeal- Regular -- Pharyngeal -- Pharyngeal- Multi-consistency -- Pharyngeal -- Pharyngeal- Pill -- Pharyngeal -- Pharyngeal Comment --  CHL IP CERVICAL ESOPHAGEAL PHASE 01/19/2021 Cervical Esophageal Phase WFL Pudding Teaspoon -- Pudding Cup -- Honey Teaspoon -- Honey Cup -- Nectar Teaspoon -- Nectar Cup -- Nectar Straw -- Thin Teaspoon -- Thin Cup -- Thin Straw -- Puree -- Mechanical Soft -- Regular -- Multi-consistency -- Pill -- Cervical Esophageal Comment -- Happi B. Rutherford Nail, M.S., CCC-SLP, [place] [facility]ologist [facility] Office [phone] Stormy Fabian 01/19/2021, 1:56 PM               Scheduled Meds:  Chlorhexidine Gluconate Cloth  6 each Topical Daily   feeding supplement ([employer]OSource TF)  45 mL Per Tube Daily   finasteride  5 mg Oral Nightly   heparin injection (subcutaneous)  5,000 Units Subcutaneous Q8H  hydrochlorothiazide  25 mg Per Tube Daily   And   lisinopril  20 mg Per Tube Daily   insulin aspart  0-9 Units Subcutaneous Q4H   insulin aspart  3 Units Subcutaneous Q4H while awake   ipratropium-albuterol  3 mL Nebulization Q6H   mouth rinse  15 mL Mouth Rinse BID   melatonin  10 mg Per Tube QHS   pantoprazole (PROTONIX) IV  40 mg Intravenous Daily   senna  1 tablet Per Tube BID   tamsulosin  0.4 mg Oral QPC supper   thiamine injection  100 mg Intravenous Daily   [START ON 01/20/2021]  valproic acid  250 mg Per Tube Daily   vitamin B-12  1,000 mcg Per Tube Daily   Continuous Infusions:  sodium chloride Stopped (01/17/21 1344)   feeding supplement (OSMOLITE 1.5 CAL) 1,000 mL (01/17/21 1305)     LOS: 11 days   Time spent: 35 minutes. More than 50% of the time was spent in counseling/coordination of care  Max Sane, MD [facility]  If 7PM-7AM, please contact night-coverage [email or web address]  01/19/2021, 3:48 PM   This record has been created using Systems analyst. Errors have been sought and corrected,but may not always be located. Such creation errors do not reflect on the standard of care.

## 2021-01-19 NOTE — Progress Notes (Signed)
Inpatient Diabetes Program Recommendations  AACE/ADA: New Consensus Statement on Inpatient Glycemic Control (2015)  Target Ranges:  Prepandial:   less than 140 mg/dL      Peak postprandial:   less than 180 mg/dL (1-2 hours)      Critically ill patients:  140 - 180 mg/dL   Lab Results  Component Value Date   GLUCAP 277 (H) 01/19/2021   HGBA1C 6.8 (H) 01/09/2021    Review of Glycemic Control Results for Eric [name], Eric [name] (MRN [id number]) as of 01/19/2021 06:56  Ref. Range 01/18/2021 11:11 01/18/2021 16:34 01/18/2021 20:26 01/19/2021 04:35  Glucose-Capillary Latest Ref Range: 70 - 99 mg/dL 247 (H) 212 (H) 267 (H) 277 (H)    Current orders for Inpatient glycemic control: Novolog 0-9 units Q4H, Novolog 3 units Q4H tube feed coverage  Inpatient Diabetes Program Recommendations:    Novolog 5 units Q4H tube feed coverage-stop if feeds are held or discontinued or CBG < 80 mg/dL.   Semglee 8 units QD (0.1 units/kg) if CBG's remain elevated.  Will continue to follow while inpatient.  Thank you, Reche Dixon, RN, BSN Diabetes Coordinator Inpatient Diabetes Program [phone] (team pager from 8a-5p)

## 2021-01-19 NOTE — Progress Notes (Signed)
Daily Progress Note   Patient Name: Eric [name]       Date: 01/19/2021 DOB: Oct 20, 1945  Age: 75 y.o. MRN#: [id number] Attending Physician: Meade Maw, MD Primary Care Physician: Ileana Roup, MD Admit Date: 01/08/2021  Reason for Consultation/Follow-up: Establishing goals of care  Subjective: Patient is sitting in bed with eyes closed. His wife is at bedside. She discusses that he has been more alert today, and worked with PT, as well as passed a swallow eval. Those images were reviewed as well as his chest xray. Discussed multiple scenarios. Discussed that at any given time,his level of alertness affects his swallowing at that time. Hopefully he will be able to sustain on an oral diet so that the [place] can be removed, and the restraints as well. Wife is hopeful he will continue to improve over the weekend. We are hopeful his mental status will improve so he can have his own [place] conversation.  I will follow up on Monday.    Length of Stay: 11  Current Medications: Scheduled Meds:   Chlorhexidine Gluconate Cloth  6 each Topical Daily   feeding supplement (PROSource TF)  45 mL Per Tube Daily   finasteride  5 mg Oral Nightly   free water  100 mL Per Tube Q4H   furosemide  40 mg Intravenous Once   heparin injection (subcutaneous)  5,000 Units Subcutaneous Q8H   hydrochlorothiazide  25 mg Per Tube Daily   And   lisinopril  20 mg Per Tube Daily   insulin aspart  0-9 Units Subcutaneous Q4H   insulin aspart  3 Units Subcutaneous Q4H while awake   ipratropium-albuterol  3 mL Nebulization Q6H   mouth rinse  15 mL Mouth Rinse BID   melatonin  10 mg Per Tube QHS   pantoprazole (PROTONIX) IV  40 mg Intravenous Daily   senna  1 tablet Per Tube BID   tamsulosin  0.4 mg Oral QPC supper    thiamine injection  100 mg Intravenous Daily   [START ON 01/20/2021] valproic acid  250 mg Per Tube Daily   vitamin B-12  1,000 mcg Per Tube Daily    Continuous Infusions:  sodium chloride Stopped (01/17/21 1344)   feeding supplement (OSMOLITE 1.5 CAL) 1,000 mL (01/17/21 1305)    PRN Meds: sodium chloride, acetaminophen **OR** acetaminophen,  bisacodyl, bismuth subsalicylate, hydrALAZINE, labetalol, ondansetron **OR** ondansetron (ZOFRAN) IV  Physical Exam Constitutional:      Comments: Eyes closed. Does not speak.             Vital Signs: BP 115/89 (BP Location: Right Arm)   Pulse 97   Temp 97.8 F (36.6 C)   Resp 18   Ht '5\' 8"'$  (1.727 m)   Wt 81.5 kg   SpO2 95%   BMI 27.32 kg/m  SpO2: SpO2: 95 % O2 Device: O2 Device: Room Air O2 Flow Rate: O2 Flow Rate (L/min): 2 L/min  Intake/output summary:  Intake/Output Summary (Last 24 hours) at 01/19/2021 1250 Last data filed at 01/19/2021 1022 Gross per 24 hour  Intake 1940 ml  Output 1075 ml  Net 865 ml   LBM: Last BM Date: 01/16/21 Baseline Weight: Weight: 79.8 kg Most recent weight: Weight: 81.5 kg         Patient Active Problem List   Diagnosis Date Noted   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 symptoms    Diabetes mellitus without complication (HCC)    Hypertension    Acute metabolic encephalopathy    Leukocytosis    GERD (gastroesophageal reflux disease)    Nasogastric tube present    S/P VP shunt    Hydrocephalus (HCC) 01/08/2021    Palliative Care Assessment & Plan     Recommendations/Plan: Continue current care. Will follow up Monday. Hopeful patient can have his own [place] conversation.    Code Status:    Code Status Orders  (From admission, onward)           Start     Ordered   01/08/21 1350  Full code  Continuous        01/08/21 1349           Code Status History     This patient has a current code status but no historical code status.      Thank you for allowing the  Palliative Medicine Team to assist in the care of this patient.       Total Time 35 min Prolonged Time Billed  no       Greater than 50%  of this time was spent counseling and coordinating care related to the above assessment and plan.  Asencion Gowda, NP  Please contact Palliative Medicine Team phone at [phone] for questions and concerns.

## 2021-01-19 NOTE — Progress Notes (Signed)
Physical Therapy Treatment Patient Details Name: Eric [name] MRN: [id number] DOB: Jun 14, 1946 Today's Date: 01/19/2021    History of Present Illness Patient is a 75 y.o. male with Hydrocephalus status post elective Right Frontal VP Shunt insertion.  Post-op with acute delirium/agitation felt to be due to complication of anesthesia plus Hyponatremia requiring Precedex drip.    PT Comments    Pt was long sitting in bed upon arriving. He was asleep upon arriving but easily awakes. Pt's lethargy has limited him with progression with PT however today pt was much more alert and able to follow simple one step commands more consistently. He was able to progress to OOB activity and even able to ambulate into hallway. Chair follow for safety. Overall pt tolerated session well and is advancing. Will continue to follow closely and may even advance recommendations to CIR if he shows he can advance tolerance/alertness to therapy. Recommend DC to SNF to address deficits while assisting pt to PLOF.    Follow Up Recommendations  SNF     Equipment Recommendations  Other (comment) (defer to next level of care)       Precautions / Restrictions Precautions Precautions: Fall Precaution Comments: hand mitts Restrictions Weight Bearing Restrictions: No    Mobility  Bed Mobility Overal bed mobility: Needs Assistance Bed Mobility: Supine to Sit Rolling: Mod assist   Supine to sit: Mod assist;Max assist (+ 1)     General bed mobility comments: Pt required mod-max assist of one to progress from supine to short sit EOB. sat EOB with supervision    Transfers Overall transfer level: Needs assistance Equipment used: Rolling walker (2 wheeled) Transfers: Sit to/from Stand Sit to Stand: Min assist;+2 safety/equipment;From elevated surface         General transfer comment: pt was able to stand with much less assistance today. overall pt is progressing as cognition  improves  Ambulation/Gait Ambulation/Gait assistance: +2 safety/equipment;Min assist Gait Distance (Feet): 40 Feet Assistive device: Rolling walker (2 wheeled) Gait Pattern/deviations: Step-to pattern;Narrow base of support Gait velocity: decreased   General Gait Details: Pt was able to ambulate 40 ft with min A       Balance Overall balance assessment: Needs assistance Sitting-balance support: Feet supported;Bilateral upper extremity supported Sitting balance-Leahy Scale: Good Sitting balance - Comments: no LOB sitting EOB   Standing balance support: Bilateral upper extremity supported Standing balance-Leahy Scale: Poor Standing balance comment: Poor due to cognition more so than physical balance deficits         Cognition Arousal/Alertness: Lethargic (but much more alert than previously observed) Behavior During Therapy: WFL for tasks assessed/performed Overall Cognitive Status: Impaired/Different from baseline Area of Impairment: Orientation;Attention;Following commands;Safety/judgement;Awareness;Problem solving      Orientation Level: Disoriented to;Place;Time;Situation Current Attention Level: Alternating   Following Commands: Follows one step commands inconsistently Safety/Judgement: Decreased awareness of safety;Decreased awareness of deficits   Problem Solving: Slow processing;Difficulty sequencing;Requires verbal cues;Requires tactile cues General Comments: Pt continues to have cognitive deficits that impacts session progression however much improved from previous days.             Pertinent Vitals/Pain Pain Assessment: 0-10 Pain Score: 4  Faces Pain Scale: Hurts a little bit Pain Location: c/o neck discomfort Pain Descriptors / Indicators: Discomfort Pain Intervention(s): Limited activity within patient's tolerance;Monitored during session;Premedicated before session;Repositioned     PT Goals (current goals can now be found in the care plan section)  Acute Rehab PT Goals Patient Stated Goal: go home Progress towards PT goals: Progressing  toward goals    Frequency    7X/week      PT Plan Current plan remains appropriate    Co-evaluation     PT goals addressed during session: Mobility/safety with mobility;Balance;Strengthening/ROM;Proper use of DME        AM-PAC PT "6 Clicks" Mobility   Outcome Measure  Help needed turning from your back to your side while in a flat bed without using bedrails?: A Lot Help needed moving from lying on your back to sitting on the side of a flat bed without using bedrails?: A Lot Help needed moving to and from a bed to a chair (including a wheelchair)?: A Lot Help needed standing up from a chair using your arms (e.g., wheelchair or bedside chair)?: A Lot Help needed to walk in hospital room?: A Lot Help needed climbing 3-5 steps with a railing? : A Lot 6 Click Score: 12    End of Session Equipment Utilized During Treatment: Gait belt Activity Tolerance: Patient tolerated treatment well Patient left: in chair;with call bell/phone within reach;with chair alarm set;with nursing/sitter in room Nurse Communication: Mobility status PT Visit Diagnosis: Unsteadiness on feet (R26.81);Muscle weakness (generalized) (M62.81);Other abnormalities of gait and mobility (R26.89)     Time: [id number] PT Time Calculation (min) (ACUTE ONLY): 30 min  Charges:  $Gait Training: 8-22 mins $Therapeutic Activity: 8-22 mins                     Julaine Fusi PTA 01/19/21, 2:02 PM

## 2021-01-20 DIAGNOSIS — G9341 Metabolic encephalopathy: Secondary | ICD-10-CM | POA: Diagnosis not present

## 2021-01-20 DIAGNOSIS — N400 Enlarged prostate without lower urinary tract symptoms: Secondary | ICD-10-CM | POA: Diagnosis not present

## 2021-01-20 DIAGNOSIS — G919 Hydrocephalus, unspecified: Secondary | ICD-10-CM | POA: Diagnosis not present

## 2021-01-20 DIAGNOSIS — E119 Type 2 diabetes mellitus without complications: Secondary | ICD-10-CM | POA: Diagnosis not present

## 2021-01-20 LAB — CULTURE, BLOOD (ROUTINE X 2)
Culture: NO GROWTH
Culture: NO GROWTH
Special Requests: ADEQUATE
Special Requests: ADEQUATE

## 2021-01-20 LAB — GLUCOSE, CAPILLARY
Glucose-Capillary: 230 mg/dL — ABNORMAL HIGH (ref 70–99)
Glucose-Capillary: 173 mg/dL — ABNORMAL HIGH (ref 70–99)
Glucose-Capillary: 256 mg/dL — ABNORMAL HIGH (ref 70–99)

## 2021-01-20 MED ORDER — IPRATROPIUM-ALBUTEROL 0.5-2.5 (3) MG/3ML IN SOLN: 3.0000 mL | Freq: Four times a day (QID) | RESPIRATORY_TRACT | Status: DC | PRN

## 2021-01-20 MED ORDER — IPRATROPIUM-ALBUTEROL 0.5-2.5 (3) MG/3ML IN SOLN
3.0000 mL | Freq: Three times a day (TID) | RESPIRATORY_TRACT | Status: DC
Start: 2021-01-20 — End: 2021-01-22
  Administered 2021-01-20 – 2021-01-22 (×7): 3 mL via RESPIRATORY_TRACT
  Filled 2021-01-20 (×7): qty 3

## 2021-01-20 NOTE — Progress Notes (Signed)
Physical Therapy Treatment Patient Details Name: Eric [name] MRN: [id number] DOB: 1946/06/10 Today's Date: 01/20/2021    History of Present Illness Patient is a 75 y.o. male with Hydrocephalus status post elective Right Frontal VP Shunt insertion.  Post-op with acute delirium/agitation felt to be due to complication of anesthesia plus Hyponatremia requiring Precedex drip.    PT Comments    Pt was long sitting in bed upon arriving. Overall more lethargic and less interactive today however still willing to participate. Supportive spouse at bedside throughout. He required increased assistance today due to lethargy. Max assist to exit R side of bed. +2 assistance throughout session for safety. Highly recommend +2 assistance for transfers. He was able to stand and ambulate increased distance however poor gait posture and needed constant assistance and encouragement to propel RW. Chair follow throughout for safety. Acute PT will continue to follow and progress as able per pt tolerance. Highly recommend DC to SNF to continue to progress pt while while maximizing independence with ADLs.    Follow Up Recommendations  SNF     Equipment Recommendations  Other (comment) (defer to next level of care)       Precautions / Restrictions Precautions Precautions: Fall Precaution Comments: hand mitts Restrictions Weight Bearing Restrictions: No    Mobility  Bed Mobility Overal bed mobility: Needs Assistance Bed Mobility: Supine to Sit Rolling: Max assist;+2 for safety/equipment   Supine to sit: Max assist;+2 for safety/equipment     General bed mobility comments: pt required more assistance today to roll to short sit. increased time and constant encouragament. pt has strength but congition and motor planning deficits slow pt's abilities.    Transfers Overall transfer level: Needs assistance Equipment used: Rolling walker (2 wheeled) Transfers: Sit to/from Stand Sit to Stand: From elevated  surface;Mod assist;Max assist;+2 safety/equipment         General transfer comment: Pt was able to STS 1 x EOB and 1 x from chair in hallway. constant vcs for technique and encouragament  Ambulation/Gait Ambulation/Gait assistance: Mod assist;+2 safety/equipment Gait Distance (Feet): 60 Feet Assistive device: Rolling walker (2 wheeled) Gait Pattern/deviations: Step-to pattern;Narrow base of support;Trunk flexed Gait velocity: decreased   General Gait Details: pt's HR elevated into 130s with ambulation. poor gait posture throughout with constant assistance to propel RW. +2 for safety with chair follow throughout. Overall pt did better with ambulation previous date.    Balance Overall balance assessment: Needs assistance Sitting-balance support: Feet supported;Bilateral upper extremity supported Sitting balance-Leahy Scale: Fair Sitting balance - Comments: more assisatnace today to maintain sitting balance 2/2 to lethargy Postural control: Posterior lean   Standing balance-Leahy Scale: Poor Standing balance comment: Needs constant assistance throughout standing for safety and prevent LOB      Cognition Arousal/Alertness: Awake/alert Behavior During Therapy: Flat affect (more lethargic today) Overall Cognitive Status: Impaired/Different from baseline Area of Impairment: Orientation;Attention;Memory      Orientation Level: Disoriented to;Place;Time;Situation Current Attention Level: Alternating Memory: Decreased recall of precautions;Decreased short-term memory Following Commands: Follows one step commands inconsistently Safety/Judgement: Decreased awareness of safety;Decreased awareness of deficits   Problem Solving: Slow processing;Difficulty sequencing;Requires verbal cues;Requires tactile cues General Comments: Pt is overall more lethargic today with less talking/interaction. He inconsistently follows one step commands. Constant encouragament and assistance throughout for  safety.             Pertinent Vitals/Pain Pain Assessment: No/denies pain     PT Goals (current goals can now be found in the care plan section)  Acute Rehab PT Goals Patient Stated Goal: none stated Progress towards PT goals: Not progressing toward goals - comment (cognition/ alertness continues to slow progression)    Frequency    7X/week      PT Plan Current plan remains appropriate    Co-evaluation     PT goals addressed during session: Mobility/safety with mobility;Proper use of DME;Balance;Strengthening/ROM        AM-PAC PT "6 Clicks" Mobility   Outcome Measure  Help needed turning from your back to your side while in a flat bed without using bedrails?: A Lot Help needed moving from lying on your back to sitting on the side of a flat bed without using bedrails?: A Lot Help needed moving to and from a bed to a chair (including a wheelchair)?: A Lot Help needed standing up from a chair using your arms (e.g., wheelchair or bedside chair)?: A Lot Help needed to walk in hospital room?: A Lot Help needed climbing 3-5 steps with a railing? : A Lot 6 Click Score: 12    End of Session Equipment Utilized During Treatment: Gait belt Activity Tolerance: Patient limited by fatigue;Patient limited by lethargy Patient left: in chair;with call bell/phone within reach;with chair alarm set;with nursing/sitter in room Nurse Communication: Mobility status PT Visit Diagnosis: Unsteadiness on feet (R26.81);Muscle weakness (generalized) (M62.81);Other abnormalities of gait and mobility (R26.89)     Time: [id number] PT Time Calculation (min) (ACUTE ONLY): 25 min  Charges:  $Gait Training: 8-22 mins $Therapeutic Activity: 8-22 mins                     Julaine Fusi PTA 01/20/21, 12:22 PM

## 2021-01-20 NOTE — Progress Notes (Signed)
PROGRESS NOTE  Consult-progress note  RENZO DAUGHDRILL  [id number] DOB: 06-30-1945 DOA: 01/08/2021 PCP: Ileana Roup, MD   Brief Narrative: Taken from consult note. RHIAN YEARTA is an 75 y.o. male with PMH of hypertension, hyperlipidemia, diabetes mellitus, GERD, depression, anxiety, BPH, hydrocephalus, who is admitted by neurosurgery for VP shunt placement for hydrocephalus. We are asked to to consult for management of chronic medical issues and altered mental status/delirium.   Pt is POD #6 from right frontal VP shunt placement for hydrocephalus. After the surgery, pt developed confusion, agitation, suspecting posterior surgery delirium.  PCCM was consulted.  Patient was initially treated with Precedex.  He has been off Precedex for more than 24 hours.  Patient is currently on tube feeding.  Per his wife at the bedside, at his normal baseline, patient is alert and orientated x3.  Currently patient knows his own name, but is not orientated to the place and time. He moves all extremities. 8/1: Little more alert but remains lethargic. 8/2: Initial swallow evaluation with increased risk of aspiration, he was able to take some applesauce and nectar thick with swallow team today.  Remained agitated, a lot of behavioral issues. Repeat CT head was without any new abnormality. 8/3: Tapering valproic acid, added insulin coverage for tube feeds 8/4: Lethargic today 8/5: Remove DHT  Subjective: Seems much more awake, eating breakfast and assistance by his wife.  No new issues  Assessment & Plan:   Principal Problem:   Hydrocephalus ([place]) Active Problems:   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 symptoms   Diabetes mellitus without complication (HCC)   Hypertension   Acute metabolic encephalopathy   Leukocytosis   GERD (gastroesophageal reflux disease)   Nasogastric tube present   S/P VP shunt  Hydrocephalus ([place]): s/p of VP shunt placement. -Management per neurosurgery  Acute  metabolic encephalopathy: Likely due to postsurgical delirium.  His mental status is much improved today.  He is eating some breakfast with assistance He cleared barium swallow today and speech therapy has ordered dysphagia 1 with thin liquid diet for him.  Remove DHT on 8/5 -Continue tapering VPA 250 mg daily for 2 days and then stop per neuro  Upper respiratory secretions and cough  -Continue chest PT, chest x-ray on 8/5 shows no acute abnormality.  Give 40 mg of Lasix IV once and start him on DuoNeb every 6 -Suction secretions as needed -Aspiration precautions  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 symptoms -Proscar and Flomax   Diabetes mellitus without complication [facility]): Recent A1c 6.8, well controlled.   -Sliding scale insulin.     Hypertension -Continue lisinopril, HCTZ   Leukocytosis: Stable no source of infection identified.  No fever.  Likely reactive, procalcitonin negative.  UA is not very impressive.  Blood cultures remain negative. -Monitor CBC   GERD (gastroesophageal reflux disease) -Protonix  Electrolyte abnormalities.  Replete and recheck  Objective: Vitals:   01/20/21 0418 01/20/21 0500 01/20/21 0802 01/20/21 1130  BP: 140/74  (!) 144/80 100/74  Pulse: 94  98 94  Resp: '16  19 16  '$ Temp: 97.6 F (36.4 C)  97.7 F (36.5 C) 97.9 F (36.6 C)  TempSrc: Oral     SpO2: 100%  95% 92%  Weight:  77.5 kg    Height:        Intake/Output Summary (Last 24 hours) at 01/20/2021 1132 Last data filed at 01/20/2021 1130 Gross per 24 hour  Intake 720 ml  Output 1750 ml  Net -1030 ml   Filed  Weights   01/18/21 0453 01/19/21 0500 01/20/21 0500  Weight: 80.1 kg 81.5 kg 77.5 kg    Examination: General.  Chronically ill-appearing, little agitated elderly man in no acute distress.  Pulmonary.  Lungs clear bilaterally, normal respiratory effort. CV.  Regular rate and rhythm, no JVD, rub or murmur. Abdomen.  Soft, nontender, nondistended, BS positive. CNS.  Awake  and alert but confused, no apparent focal deficit Extremities.  No edema, no cyanosis, pulses intact and symmetrical. Psychiatry.  Judgment and insight appears impaired.  Level of care: Progressive Cardiac  All the records are reviewed and case discussed with Care Management/Social Worker. Management plans discussed with the patient, nursing and they are in agreement.   Data Reviewed: I have personally reviewed following labs and imaging studies  CBC: Recent Labs  Lab 01/14/21 0501 01/15/21 0441 01/19/21 0444  WBC 10.8* 14.1* 12.5*  HGB 14.3 14.2 14.8  HCT 39.9 40.6 42.0  MCV 94.3 96.2 95.2  PLT 290 288 [id number]   Basic Metabolic Panel: Recent Labs  Lab 01/14/21 0501 01/15/21 0441 01/16/21 0356 01/17/21 0441 01/18/21 0443 01/19/21 0444  NA 143 143 141 137 136 134*  K 3.1* 3.5 3.2* 4.2 4.0 4.1  CL 112* 108 105 102 99 99  CO2 '26 26 29 28 29 28  '$ GLUCOSE 142* 165* 229* 228* 280* 307*  BUN 21 20 26* 22 24* 23  CREATININE 0.85 0.76 0.76 0.78 0.76 0.84  CALCIUM 8.3* 8.8* 8.7* 8.8* 8.6* 8.5*  MG 2.5* 2.3 2.3 2.2 2.4  --   PHOS 2.8 2.8 2.3* 2.4* 2.4*  --    GFR: Estimated Creatinine Clearance: 72.4 mL/min (by C-G formula based on SCr of 0.84 mg/dL). Liver Function Tests: No results for input(s): AST, ALT, ALKPHOS, BILITOT, PROT, ALBUMIN in the last 168 hours.  No results for input(s): LIPASE, AMYLASE in the last 168 hours. No results for input(s): AMMONIA in the last 168 hours.  Coagulation Profile: No results for input(s): INR, PROTIME in the last 168 hours. Cardiac Enzymes: No results for input(s): CKTOTAL, CKMB, CKMBINDEX, TROPONINI in the last 168 hours. BNP (last 3 results) No results for input(s): PROBNP in the last 8760 hours. HbA1C: No results for input(s): HGBA1C in the last 72 hours. CBG: Recent Labs  Lab 01/19/21 0435 01/19/21 0747 01/19/21 1128 01/19/21 1635 01/19/21 2055  GLUCAP 277* 237* 204* 236* 204*   Lipid Profile: No results for input(s):  CHOL, HDL, LDLCALC, TRIG, CHOLHDL, LDLDIRECT in the last 72 hours. Thyroid Function Tests: No results for input(s): TSH, T4TOTAL, FREET4, T3FREE, THYROIDAB in the last 72 hours. Anemia Panel: No results for input(s): VITAMINB12, FOLATE, FERRITIN, TIBC, IRON, RETICCTPCT in the last 72 hours. Sepsis Labs: Recent Labs  Lab 01/15/21 0441 01/16/21 0356 01/17/21 0441  PROCALCITON <0.10 <0.10 <0.10    Recent Results (from the past 240 hour(s))  CULTURE, BLOOD (ROUTINE X 2) w Reflex to ID Panel     Status: None   Collection Time: 01/15/21  8:37 AM   Specimen: BLOOD LEFT HAND  Result Value Ref Range Status   Specimen Description BLOOD LEFT HAND  Final   Special Requests   Final    BOTTLES DRAWN AEROBIC AND ANAEROBIC Blood Culture adequate volume   Culture   Final    NO GROWTH 5 DAYS Performed at [facility], [address] [place], [place] [address]    Report Status 01/20/2021 FINAL  Final  CULTURE, BLOOD (ROUTINE X 2) w Reflex to ID Panel  Status: None   Collection Time: 01/15/21  8:38 AM   Specimen: BLOOD LEFT FOREARM  Result Value Ref Range Status   Specimen Description BLOOD LEFT FOREARM  Final   Special Requests   Final    BOTTLES DRAWN AEROBIC AND ANAEROBIC Blood Culture adequate volume   Culture   Final    NO GROWTH 5 DAYS Performed at [facility], [address] [place], [place] [address]    Report Status 01/20/2021 FINAL  Final      Radiology Studies: [facility] Chest Port 1 View  Result Date: 01/19/2021 CLINICAL DATA:  Cough. EXAM: PORTABLE CHEST 1 VIEW COMPARISON:  T1-2 1,022 FINDINGS: Heart size and vascularity normal. Lungs clear without infiltrate or effusion. Feeding tube enters the stomach with the tip not visualized. Shunt tubing overlying the right chest unchanged. IMPRESSION: No active disease. Electronically Signed   By: Franchot Gallo M.D.   On: 01/19/2021 12:23   DG Swallowing Func-Speech Pathology  Result Date: 01/19/2021 Formatting of  this result is different from the original. Objective Swallowing Evaluation: Type of Study: MBS-Modified Barium Swallow Study  Patient Details Name: ZAYLEN HIGHLAND MRN: [id number] Date of Birth: 01-15-1946 Today's Date: 01/19/2021 Time: SLP Start Time (ACUTE ONLY): [id number] -SLP Stop Time (ACUTE ONLY): 1100 SLP Time Calculation (min) (ACUTE ONLY): 24 min Past Medical History: Past Medical History: Diagnosis Date  Anxiety   Arthritis   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 symptoms   Diabetes ([place])   no meds-last a1c 5.7  Fall   GERD (gastroesophageal reflux disease)   HLD (hyperlipidemia)   Hydrocephalus ([place])   Hypertension   Unsteady gait  Past Surgical History: Past Surgical History: Procedure Laterality Date  arm surgery Left   fracture  COLONOSCOPY    TONSILLECTOMY  1962  VENTRICULOPERITONEAL SHUNT Right 01/08/2021  Procedure: RIGHT FRONTAL VENTRICULOPERITONEAL SHUNT INSERTION;  Surgeon: Deetta Perla, MD;  Location: [facility] ORS;  Service: Neurosurgery;  Laterality: Right; HPI: TAWAN TABET is a 75 y.o. 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 HE is status post elective Right Frontal VP Shunt insertion.  Post-op with acute delirium/agitation felt to be due to complication of anesthesia requiring Precedex drip. 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  Subjective: pt alert, responsive, able to follow simple directions Assessment / Plan / Recommendation CHL IP CLINICAL IMPRESSIONS 01/19/2021 Clinical Impression Pt was alert throughout study but unable to follow any directions or respond with verbalization. NG in place and visible on imaging. Pt presents with moderate to severe oral phase dysphagia and mild  pharyngeal dysphagia. When consuming nectar thick liquids via spoon, thin liquids via straw and puree, pt demonstrates decreased lingual manipulation that results in decreased bolus cohesion, premature spillage and oral residue that prompts an additional swallow. Pt has a mild sensori-motor pharyngeal dysphagia that his c/b delayed swallow initiation at the level of the pyriform sinuses and very trace pharyngeal residue. Pt protected his airway well throughout the study. Aspiration and penetration were never observed despite pt's coughing throughout consumption. At this time recommend dysphagia 1 diet with thin liquids via straw, medicine crushed in puree under strict aspiration precautions. Education provided to pt, his wife and his treatment team. Of note, pt's chest x-ray was negative today. SLP Visit Diagnosis Dysphagia, oropharyngeal phase (R13.12);Cognitive  communication deficit (R41.841) Attention and concentration deficit following -- Frontal lobe and executive function deficit following -- Impact on safety and function Mild aspiration risk   CHL IP TREATMENT RECOMMENDATION 01/19/2021 Treatment Recommendations No treatment recommended at this time   Prognosis 01/19/2021 Prognosis for Safe Diet Advancement [place] Barriers to Reach Goals Cognitive deficits;Time post onset;Severity of deficits;Motivation Barriers/Prognosis Comment -- CHL IP DIET RECOMMENDATION 01/19/2021 SLP Diet Recommendations Dysphagia 1 (Puree) solids;Thin liquid Liquid Administration via Straw Medication Administration Crushed with puree Compensations Minimize environmental distractions;Slow rate;Small sips/bites Postural Changes Remain semi-upright after after feeds/meals (Comment);Seated upright at 90 degrees   CHL IP OTHER RECOMMENDATIONS 01/19/2021 Recommended Consults -- Oral Care Recommendations Oral care BID Other Recommendations --   CHL IP FOLLOW UP RECOMMENDATIONS 01/19/2021 Follow up Recommendations Skilled Nursing facility   [facility] IP  FREQUENCY AND DURATION 01/11/2021 Speech Therapy Frequency (ACUTE ONLY) min 2x/week Treatment Duration 2 weeks      CHL IP ORAL PHASE 01/19/2021 Oral Phase Impaired Oral - Pudding Teaspoon -- Oral - Pudding Cup -- Oral - Honey Teaspoon -- Oral - Honey Cup -- Oral - Nectar Teaspoon Weak lingual manipulation;Incomplete tongue to palate contact;Reduced posterior propulsion;Lingual/palatal residue;Delayed oral transit;Decreased bolus cohesion;Premature spillage Oral - Nectar Cup Weak lingual manipulation;Incomplete tongue to palate contact;Reduced posterior propulsion;Lingual/palatal residue;Delayed oral transit;Decreased bolus cohesion;Premature spillage Oral - Nectar Straw -- Oral - Thin Teaspoon Weak lingual manipulation;Incomplete tongue to palate contact;Reduced posterior propulsion;Lingual/palatal residue;Delayed oral transit;Decreased bolus cohesion;Premature spillage Oral - Thin Cup -- Oral - Thin Straw Weak lingual manipulation;Incomplete tongue to palate contact;Reduced posterior propulsion;Lingual/palatal residue;Delayed oral transit;Decreased bolus cohesion;Premature spillage Oral - Puree Weak lingual manipulation;Incomplete tongue to palate contact;Reduced posterior propulsion;Lingual/palatal residue;Delayed oral transit;Decreased bolus cohesion;Premature spillage Oral - Mech Soft -- Oral - Regular -- Oral - Multi-Consistency -- Oral - Pill -- Oral Phase - Comment --  CHL IP PHARYNGEAL PHASE 01/19/2021 Pharyngeal Phase Impaired Pharyngeal- Pudding Teaspoon -- Pharyngeal -- Pharyngeal- Pudding Cup -- Pharyngeal -- Pharyngeal- Honey Teaspoon -- Pharyngeal -- Pharyngeal- Honey Cup -- Pharyngeal -- Pharyngeal- Nectar Teaspoon Delayed swallow initiation-pyriform sinuses;Pharyngeal residue - valleculae Pharyngeal -- Pharyngeal- Nectar Cup Delayed swallow initiation-pyriform sinuses;Pharyngeal residue - valleculae Pharyngeal -- Pharyngeal- Nectar Straw -- Pharyngeal -- Pharyngeal- Thin Teaspoon Delayed swallow  initiation-pyriform sinuses;Pharyngeal residue - valleculae Pharyngeal -- Pharyngeal- Thin Cup -- Pharyngeal -- Pharyngeal- Thin Straw Delayed swallow initiation-pyriform sinuses;Delayed swallow initiation-vallecula;Pharyngeal residue - valleculae Pharyngeal -- Pharyngeal- Puree Delayed swallow initiation-vallecula Pharyngeal -- Pharyngeal- Mechanical Soft -- Pharyngeal -- Pharyngeal- Regular -- Pharyngeal -- Pharyngeal- Multi-consistency -- Pharyngeal -- Pharyngeal- Pill -- Pharyngeal -- Pharyngeal Comment --  CHL IP CERVICAL ESOPHAGEAL PHASE 01/19/2021 Cervical Esophageal Phase WFL Pudding Teaspoon -- Pudding Cup -- Honey Teaspoon -- Honey Cup -- Nectar Teaspoon -- Nectar Cup -- Nectar Straw -- Thin Teaspoon -- Thin Cup -- Thin Straw -- Puree -- Mechanical Soft -- Regular -- Multi-consistency -- Pill -- Cervical Esophageal Comment -- Happi B. Rutherford Nail, M.S., CCC-SLP, [place] Office [phone] Stormy Fabian 01/19/2021, 1:56 PM               Scheduled Meds:  Chlorhexidine Gluconate Cloth  6 each Topical Daily   feeding supplement (PROSource TF)  45 mL Per Tube Daily   finasteride  5 mg Oral Nightly   heparin injection (subcutaneous)  5,000 Units Subcutaneous Q8H   hydrochlorothiazide  25 mg Per Tube Daily   And   lisinopril  20 mg Per Tube Daily   insulin aspart  0-15 Units Subcutaneous TID  WC   insulin aspart  0-5 Units Subcutaneous QHS   ipratropium-albuterol  3 mL Nebulization TID   mouth rinse  15 mL Mouth Rinse BID   melatonin  10 mg Per Tube QHS   pantoprazole (PROTONIX) IV  40 mg Intravenous Daily   senna  1 tablet Per Tube BID   tamsulosin  0.4 mg Oral QPC supper   thiamine injection  100 mg Intravenous Daily   valproic acid  250 mg Per Tube Daily   vitamin B-12  1,000 mcg Per Tube Daily   Continuous Infusions:  sodium chloride Stopped (01/17/21 1344)   feeding supplement (OSMOLITE 1.5 CAL) 1,000 mL (01/17/21 1305)     LOS: 12 days    Time spent: 35 minutes. More than 50% of the time was spent in counseling/coordination of care  Max Sane, MD [facility]  If 7PM-7AM, please contact night-coverage [email or web address]  01/20/2021, 11:32 AM   This record has been created using Systems analyst. Errors have been sought and corrected,but may not always be located. Such creation errors do not reflect on the standard of care.

## 2021-01-21 DIAGNOSIS — E119 Type 2 diabetes mellitus without complications: Secondary | ICD-10-CM | POA: Diagnosis not present

## 2021-01-21 DIAGNOSIS — G9341 Metabolic encephalopathy: Secondary | ICD-10-CM | POA: Diagnosis not present

## 2021-01-21 DIAGNOSIS — N400 Enlarged prostate without lower urinary tract symptoms: Secondary | ICD-10-CM | POA: Diagnosis not present

## 2021-01-21 DIAGNOSIS — G919 Hydrocephalus, unspecified: Secondary | ICD-10-CM | POA: Diagnosis not present

## 2021-01-21 LAB — GLUCOSE, CAPILLARY
Glucose-Capillary: 225 mg/dL — ABNORMAL HIGH (ref 70–99)
Glucose-Capillary: 235 mg/dL — ABNORMAL HIGH (ref 70–99)
Glucose-Capillary: 237 mg/dL — ABNORMAL HIGH (ref 70–99)
Glucose-Capillary: 201 mg/dL — ABNORMAL HIGH (ref 70–99)

## 2021-01-21 MED ORDER — HALOPERIDOL LACTATE 5 MG/ML IJ SOLN: 2.0000 mg | Freq: Four times a day (QID) | INTRAMUSCULAR | Status: DC | PRN

## 2021-01-21 MED ORDER — LORAZEPAM 1 MG PO TABS
1.0000 mg | ORAL_TABLET | Freq: Four times a day (QID) | ORAL | Status: DC | PRN
  Administered 2021-01-21 – 2021-01-23 (×3): 1 mg via ORAL
  Filled 2021-01-21 (×3): qty 1

## 2021-01-21 MED ORDER — INSULIN ASPART 100 UNIT/ML IJ SOLN
3.0000 [IU] | Freq: Three times a day (TID) | INTRAMUSCULAR | Status: DC
  Administered 2021-01-21 – 2021-01-31 (×13): 3 [IU] via SUBCUTANEOUS
  Filled 2021-01-21 (×14): qty 1

## 2021-01-21 MED ORDER — INSULIN GLARGINE-YFGN 100 UNIT/ML ~~LOC~~ SOLN
20.0000 [IU] | Freq: Every day | SUBCUTANEOUS | Status: DC
  Administered 2021-01-21 – 2021-01-22 (×2): 20 [IU] via SUBCUTANEOUS
  Filled 2021-01-21 (×2): qty 0.2

## 2021-01-21 MED ORDER — LORAZEPAM 2 MG/ML IJ SOLN
1.0000 mg | Freq: Four times a day (QID) | INTRAMUSCULAR | Status: DC | PRN
  Administered 2021-01-22: 1 mg via INTRAMUSCULAR
  Filled 2021-01-21: qty 1

## 2021-01-21 NOTE — TOC Progression Note (Signed)
Transition of Care [facility]) - Progression Note    Patient Details  Name: Eric [name] MRN: [id number] Date of Birth: 1945-07-19  Transition of Care [facility]) CM/SW [place], [place] Phone Number: 01/21/2021, 1:38 PM  Clinical Narrative:     CSW  notes per MD patient to potentially dc tomorrow.   CSW has started insurance auth and faxed clinicals.   Patient pending a bed offer right now, CSW notes preference for [facility]. No bed offer from them right now so CSW has faxed out to more facilities in attempts for a bed offer.     Expected Discharge Plan: [place] Barriers to Discharge: Continued Medical Work up  Expected Discharge Plan and Services Expected Discharge Plan: [place] In-house Referral: Clinical Social Work   Post Acute Care Choice: [place] Living arrangements for the past 2 months: Single Family Home                                       Social Determinants of Health (SDOH) Interventions    Readmission Risk Interventions No flowsheet data found.

## 2021-01-21 NOTE — Progress Notes (Signed)
PROGRESS NOTE  Consult-progress note  Eric [name]  [id number] DOB: [birth year] DOA: 01/08/2021 PCP: Ileana Roup, MD   Brief Narrative: Taken from consult note. Eric [name] is an 75 y.o. male with PMH of hypertension, hyperlipidemia, diabetes mellitus, GERD, depression, anxiety, BPH, hydrocephalus, who is admitted by neurosurgery for VP shunt placement for hydrocephalus. We are asked to to consult for management of chronic medical issues and altered mental status/delirium.   Pt is POD #6 from right frontal VP shunt placement for hydrocephalus. After the surgery, pt developed confusion, agitation, suspecting posterior surgery delirium.  PCCM was consulted.  Patient was initially treated with Precedex.  He has been off Precedex for more than 24 hours.  Patient is currently on tube feeding.  Per his wife at the bedside, at his normal baseline, patient is alert and orientated x3.  Currently patient knows his own name, but is not orientated to the place and time. He moves all extremities. 8/1: Little more alert but remains lethargic. 8/2: Initial swallow evaluation with increased risk of aspiration, he was able to take some applesauce and nectar thick with swallow team today.  Remained agitated, a lot of behavioral issues. Repeat CT head was without any new abnormality. 8/3: Tapering valproic acid, added insulin coverage for tube feeds 8/4: Lethargic today 8/5: Remove DHT 8/6: Tolerating oral diet 8/7: More awake and alert  Subjective: Wife hoping to have speech reeval tomorrow as patient is tolerating current diet.  No new issues  Assessment & Plan:   Principal Problem:   Hydrocephalus ([place]) Active Problems:   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 symptoms   Diabetes mellitus without complication (HCC)   Hypertension   Acute metabolic encephalopathy   Leukocytosis   GERD (gastroesophageal reflux disease)   Nasogastric tube present   S/P VP shunt  Hydrocephalus ([place]):  s/p of VP shunt placement. -Management per neurosurgery  Acute metabolic encephalopathy: Likely due to postsurgical delirium.  His mental status is much improved today.  He is eating some breakfast with assistance He cleared barium swallow today and speech therapy has ordered dysphagia 1 with thin liquid diet for him.  Remove DHT on 8/5 -stop VPA  Upper respiratory secretions and cough  -Continue chest PT, chest x-ray on 8/5 shows no acute abnormality.  Give 40 mg of Lasix IV once and start him on DuoNeb every 6 Net IO Since Admission: -2,793.84 mL [01/21/21 1423]  -Suction secretions as needed -Aspiration precautions  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 symptoms -Proscar and Flomax   Diabetes mellitus without complication [facility]): Recent A1c 6.8, well controlled.   -Sliding scale insulin.     Hypertension -Continue lisinopril, HCTZ   Leukocytosis: Stable no source of infection identified.  No fever.  Likely reactive, procalcitonin negative.  UA is not very impressive.  Blood cultures remain negative. -Monitor CBC   GERD (gastroesophageal reflux disease) -Protonix  Electrolyte abnormalities.  Replete and recheck  Objective: Vitals:   01/21/21 0216 01/21/21 0425 01/21/21 0745 01/21/21 1150  BP:  112/74 (!) 151/75 118/76  Pulse:  96 (!) 101 97  Resp:  '20 17 17  '$ Temp:  (!) 97.4 F (36.3 C) 97.8 F (36.6 C) 97.6 F (36.4 C)  TempSrc:      SpO2:  100% 92% 92%  Weight: 75.7 kg     Height:        Intake/Output Summary (Last 24 hours) at 01/21/2021 1420 Last data filed at 01/21/2021 1150 Gross per 24 hour  Intake 120 ml  Output 800 ml  Net -680 ml   Filed Weights   01/19/21 0500 01/20/21 0500 01/21/21 0216  Weight: 81.5 kg 77.5 kg 75.7 kg    Examination: General.  Chronically ill-appearing, elderly man in no acute distress.  Pulmonary.  Lungs clear bilaterally, normal respiratory effort. CV.  Regular rate and rhythm, no JVD, rub or murmur. Abdomen.  Soft,  nontender, nondistended, BS positive. CNS.  Awake and alert but confused, no apparent focal deficit Extremities.  No edema, no cyanosis, pulses intact and symmetrical. Psychiatry.  Judgment and insight appears impaired.  Level of care: Progressive Cardiac  All the records are reviewed and case discussed with Care Management/Social Worker. Management plans discussed with the patient, nursing and they are in agreement.   Data Reviewed: I have personally reviewed following labs and imaging studies  CBC: Recent Labs  Lab 01/15/21 0441 01/19/21 0444  WBC 14.1* 12.5*  HGB 14.2 14.8  HCT 40.6 42.0  MCV 96.2 95.2  PLT 288 [id number]   Basic Metabolic Panel: Recent Labs  Lab 01/15/21 0441 01/16/21 0356 01/17/21 0441 01/18/21 0443 01/19/21 0444  NA 143 141 137 136 134*  K 3.5 3.2* 4.2 4.0 4.1  CL 108 105 102 99 99  CO2 '26 29 28 29 28  '$ GLUCOSE 165* 229* 228* 280* 307*  BUN 20 26* 22 24* 23  CREATININE 0.76 0.76 0.78 0.76 0.84  CALCIUM 8.8* 8.7* 8.8* 8.6* 8.5*  MG 2.3 2.3 2.2 2.4  --   PHOS 2.8 2.3* 2.4* 2.4*  --    GFR: Estimated Creatinine Clearance: 72.4 mL/min (by C-G formula based on SCr of 0.84 mg/dL). Liver Function Tests: No results for input(s): AST, ALT, ALKPHOS, BILITOT, PROT, ALBUMIN in the last 168 hours.  No results for input(s): LIPASE, AMYLASE in the last 168 hours. No results for input(s): AMMONIA in the last 168 hours.  Coagulation Profile: No results for input(s): INR, PROTIME in the last 168 hours. Cardiac Enzymes: No results for input(s): CKTOTAL, CKMB, CKMBINDEX, TROPONINI in the last 168 hours. BNP (last 3 results) No results for input(s): PROBNP in the last 8760 hours. HbA1C: No results for input(s): HGBA1C in the last 72 hours. CBG: Recent Labs  Lab 01/20/21 1148 01/20/21 1619 01/20/21 1959 01/21/21 0743 01/21/21 1147  GLUCAP 230* 173* 256* 225* 235*   Lipid Profile: No results for input(s): CHOL, HDL, LDLCALC, TRIG, CHOLHDL, LDLDIRECT in  the last 72 hours. Thyroid Function Tests: No results for input(s): TSH, T4TOTAL, FREET4, T3FREE, THYROIDAB in the last 72 hours. Anemia Panel: No results for input(s): VITAMINB12, FOLATE, FERRITIN, TIBC, IRON, RETICCTPCT in the last 72 hours. Sepsis Labs: Recent Labs  Lab 01/15/21 0441 01/16/21 0356 01/17/21 0441  PROCALCITON <0.10 <0.10 <0.10    Recent Results (from the past 240 hour(s))  CULTURE, BLOOD (ROUTINE X 2) w Reflex to ID Panel     Status: None   Collection Time: 01/15/21  8:37 AM   Specimen: BLOOD LEFT HAND  Result Value Ref Range Status   Specimen Description BLOOD LEFT HAND  Final   Special Requests   Final    BOTTLES DRAWN AEROBIC AND ANAEROBIC Blood Culture adequate volume   Culture   Final    NO GROWTH 5 DAYS Performed at [facility], [address] [place], [place] [address]    Report Status 01/20/2021 FINAL  Final  CULTURE, BLOOD (ROUTINE X 2) w Reflex to ID Panel     Status: None   Collection Time: 01/15/21  8:38 AM   Specimen: BLOOD LEFT FOREARM  Result Value Ref Range Status   Specimen Description BLOOD LEFT FOREARM  Final   Special Requests   Final    BOTTLES DRAWN AEROBIC AND ANAEROBIC Blood Culture adequate volume   Culture   Final    NO GROWTH 5 DAYS Performed at [facility] [address], [place], [place] [address]    Report Status 01/20/2021 FINAL  Final      Radiology Studies: No results found.  Scheduled Meds:  Chlorhexidine Gluconate Cloth  6 each Topical Daily   finasteride  5 mg Oral Nightly   heparin injection (subcutaneous)  5,000 Units Subcutaneous Q8H   hydrochlorothiazide  25 mg Per Tube Daily   And   lisinopril  20 mg Per Tube Daily   insulin aspart  0-15 Units Subcutaneous TID WC   insulin aspart  0-5 Units Subcutaneous QHS   insulin aspart  3 Units Subcutaneous TID WC   insulin glargine-yfgn  20 Units Subcutaneous Daily   ipratropium-albuterol  3 mL Nebulization TID   mouth rinse  15 mL Mouth  Rinse BID   melatonin  10 mg Per Tube QHS   pantoprazole (PROTONIX) IV  40 mg Intravenous Daily   senna  1 tablet Per Tube BID   tamsulosin  0.4 mg Oral QPC supper   thiamine injection  100 mg Intravenous Daily   vitamin B-12  1,000 mcg Per Tube Daily   Continuous Infusions:  sodium chloride Stopped (01/17/21 1344)     LOS: 13 days   Time spent: 15 minutes. More than 50% of the time was spent in counseling/coordination of care  Max Sane, MD [facility]  If 7PM-7AM, please contact night-coverage [email or web address]  01/21/2021, 2:20 PM   This record has been created using Systems analyst. Errors have been sought and corrected,but may not always be located. Such creation errors do not reflect on the standard of care.

## 2021-01-21 NOTE — Progress Notes (Cosign Needed Addendum)
Physical Therapy Treatment Patient Details Name: Eric [name] MRN: [id number] DOB: 1946-03-26 Today's Date: 01/21/2021    History of Present Illness Patient is a 75 y.o. male with Hydrocephalus status post elective Right Frontal VP Shunt insertion.  Post-op with acute delirium/agitation felt to be due to complication of anesthesia plus Hyponatremia requiring Precedex drip.    PT Comments    Pt with respiratory upon entering room.  Pt stating he needed to use bathroom and was somewhat agitated.  Assisted to in OOB with min a x 2 and to commode at bedside.  Pt is given time but no voiding or BM noted.  He stands to walker and takes several unsteady steps with walker.  Decided to try HHA +2 and he is able to walk 6' with min a x 2.  Short shuffling steps with stooped posture but overall does well.  He did state general fatigue from activity.  Pt more alert and participating in session today.  CIR consult among rehab staff had been discussed.  Discussed with MD's and they feel SNF remains more appropriate.  Will leave recommendations for SNF.   Follow Up Recommendations  SNF     Equipment Recommendations  Other (comment) (defer to next level of care)    Recommendations for Other Services       Precautions / Restrictions Precautions Precautions: Fall Precaution Comments: hand mitts Restrictions Weight Bearing Restrictions: No    Mobility  Bed Mobility Overal bed mobility: Needs Assistance Bed Mobility: Supine to Sit     Supine to sit: Min assist;Mod assist;+2 for physical assistance          Transfers Overall transfer level: Needs assistance Equipment used: None Transfers: Sit to/from Stand Sit to Stand: Min assist;+2 physical assistance            Ambulation/Gait Ambulation/Gait assistance: Min assist;Mod assist;+2 physical assistance   Assistive device: 2 person hand held assist Gait Pattern/deviations: Step-to pattern;Narrow base of support;Trunk flexed Gait  velocity: decreased   General Gait Details: fatiggued with gait but overall does well with +2 HHA and cues   Stairs             Wheelchair Mobility    Modified Rankin (Stroke Patients Only)       Balance Overall balance assessment: Needs assistance Sitting-balance support: Feet supported;Bilateral upper extremity supported Sitting balance-Leahy Scale: Fair Sitting balance - Comments: agitated upon entering room stating he needs to use bathroom Postural control: Posterior lean   Standing balance-Leahy Scale: Poor Standing balance comment: Needs constant assistance throughout standing for safety and prevent LOB                            Cognition Arousal/Alertness: Awake/alert Behavior During Therapy: WFL for tasks assessed/performed;Restless Overall Cognitive Status: Impaired/Different from baseline                                        Exercises      General Comments        Pertinent Vitals/Pain Pain Assessment: No/denies pain    Home Living                      Prior Function            PT Goals (current goals can now be found in the care plan section) Progress  towards PT goals: Progressing toward goals    Frequency    7X/week      PT Plan Discharge plan needs to be updated    Co-evaluation              AM-PAC PT "6 Clicks" Mobility   Outcome Measure  Help needed turning from your back to your side while in a flat bed without using bedrails?: A Lot Help needed moving from lying on your back to sitting on the side of a flat bed without using bedrails?: A Lot Help needed moving to and from a bed to a chair (including a wheelchair)?: A Lot Help needed standing up from a chair using your arms (e.g., wheelchair or bedside chair)?: A Lot Help needed to walk in hospital room?: A Lot Help needed climbing 3-5 steps with a railing? : A Lot 6 Click Score: 12    End of Session Equipment Utilized During  Treatment: Gait belt Activity Tolerance: Patient limited by fatigue;Patient limited by lethargy Patient left: in chair;with call bell/phone within reach;with chair alarm set;with nursing/sitter in room Nurse Communication: Mobility status PT Visit Diagnosis: Unsteadiness on feet (R26.81);Muscle weakness (generalized) (M62.81);Other abnormalities of gait and mobility (R26.89)     Time: [id number] PT Time Calculation (min) (ACUTE ONLY): 17 min  Charges:  $Gait Training: 8-22 mins                    Chesley Noon, PTA 01/21/21, 2:10 PM , 2:06 PM

## 2021-01-22 DIAGNOSIS — G9341 Metabolic encephalopathy: Secondary | ICD-10-CM | POA: Diagnosis not present

## 2021-01-22 DIAGNOSIS — Z7189 Other specified counseling: Secondary | ICD-10-CM | POA: Diagnosis not present

## 2021-01-22 DIAGNOSIS — G919 Hydrocephalus, unspecified: Secondary | ICD-10-CM | POA: Diagnosis not present

## 2021-01-22 DIAGNOSIS — N400 Enlarged prostate without lower urinary tract symptoms: Secondary | ICD-10-CM | POA: Diagnosis not present

## 2021-01-22 DIAGNOSIS — E119 Type 2 diabetes mellitus without complications: Secondary | ICD-10-CM | POA: Diagnosis not present

## 2021-01-22 LAB — GLUCOSE, CAPILLARY
Glucose-Capillary: 158 mg/dL — ABNORMAL HIGH (ref 70–99)
Glucose-Capillary: 241 mg/dL — ABNORMAL HIGH (ref 70–99)
Glucose-Capillary: 87 mg/dL (ref 70–99)
Glucose-Capillary: 263 mg/dL — ABNORMAL HIGH (ref 70–99)

## 2021-01-22 MED ORDER — HALOPERIDOL LACTATE 5 MG/ML IJ SOLN
INTRAMUSCULAR | Status: AC
  Filled 2021-01-22: qty 1

## 2021-01-22 MED ORDER — PANTOPRAZOLE SODIUM 40 MG PO TBEC
40.0000 mg | DELAYED_RELEASE_TABLET | Freq: Every day | ORAL | Status: DC
  Administered 2021-01-23 – 2021-01-31 (×9): 40 mg via ORAL
  Filled 2021-01-22 (×9): qty 1

## 2021-01-22 MED ORDER — NEPRO/CARBSTEADY PO LIQD
237.0000 mL | Freq: Three times a day (TID) | ORAL | Status: DC
  Administered 2021-01-22 – 2021-01-31 (×24): 237 mL via ORAL

## 2021-01-22 MED ORDER — ADULT MULTIVITAMIN W/MINERALS CH
1.0000 | ORAL_TABLET | Freq: Every day | ORAL | Status: DC
  Administered 2021-01-23 – 2021-01-31 (×9): 1 via ORAL
  Filled 2021-01-22 (×9): qty 1

## 2021-01-22 MED ORDER — THIAMINE HCL 100 MG PO TABS
100.0000 mg | ORAL_TABLET | Freq: Every day | ORAL | Status: DC
  Administered 2021-01-22 – 2021-01-31 (×10): 100 mg via ORAL
  Filled 2021-01-22 (×11): qty 1

## 2021-01-22 MED ORDER — INSULIN GLARGINE-YFGN 100 UNIT/ML ~~LOC~~ SOLN
30.0000 [IU] | Freq: Every day | SUBCUTANEOUS | Status: DC
  Administered 2021-01-23 – 2021-01-31 (×7): 30 [IU] via SUBCUTANEOUS
  Filled 2021-01-22 (×10): qty 0.3

## 2021-01-22 MED ORDER — IPRATROPIUM-ALBUTEROL 0.5-2.5 (3) MG/3ML IN SOLN
3.0000 mL | Freq: Two times a day (BID) | RESPIRATORY_TRACT | Status: DC
  Administered 2021-01-22 – 2021-01-26 (×8): 3 mL via RESPIRATORY_TRACT
  Filled 2021-01-22 (×8): qty 3

## 2021-01-22 MED ORDER — HALOPERIDOL LACTATE 5 MG/ML IJ SOLN
2.0000 mg | Freq: Four times a day (QID) | INTRAMUSCULAR | Status: AC | PRN
  Administered 2021-01-22: 2 mg via INTRAMUSCULAR
  Filled 2021-01-22: qty 1

## 2021-01-22 MED ORDER — FUROSEMIDE 10 MG/ML IJ SOLN
40.0000 mg | Freq: Once | INTRAMUSCULAR | Status: DC
  Filled 2021-01-22: qty 4

## 2021-01-22 NOTE — Progress Notes (Signed)
Nutrition Follow Up Note   DOCUMENTATION CODES:   Not applicable  INTERVENTION:   Nepro Shake po TID, each supplement provides 425 kcal and 19 grams protein  MVI po daily   NUTRITION DIAGNOSIS:   Inadequate oral intake related to acute illness as evidenced by NPO status. - resolving   GOAL:   Patient will meet greater than or equal to 90% of their needs -progressing   MONITOR:   PO intake, Supplement acceptance, Labs, Weight trends, Skin, I & O's  ASSESSMENT:   75 y.o. male with PMH of hypertension, hyperlipidemia, diabetes mellitus, GERD, depression, anxiety, BPH and hydrocephalus who is admitted by neurosurgery for VP shunt placement 0/10 complicated by ongoing delerium  Met with pt in room today. Pt unable to provide any history. Pt s/p MBSS 8/5 and advanced to a dysphagia 1/thin liquid diet per SLP; NGT removed at that time. Pt's oral intake varies. NT reports that pt has not been awake enough to eat lunch today; pt is documented to have eaten 90% of his breakfast. Pt ate 0-75% of his meals yesterday. RD will add supplements to help pt meet his estimated needs. Per chart, pt is down 9lbs(5%) since admission; this is significant. Family declined PEG tube placement. No new labs since 8/5.   Medications reviewed and include: lasix, heparin, insulin, melatonin, MVI, protonix, senokot, thiamine, B12  Labs reviewed: none since 8/5 Cbgs- 158, 241 x 24 hrs  Nutrition Focused Physical Exam:  Flowsheet Row Most Recent Value  Orbital Region No depletion  Upper Arm Region No depletion  Thoracic and Lumbar Region No depletion  Buccal Region No depletion  Temple Region Mild depletion  Clavicle Bone Region Mild depletion  Clavicle and Acromion Bone Region Mild depletion  Scapular Bone Region No depletion  Dorsal Hand Unable to assess  Patellar Region Moderate depletion  Anterior Thigh Region Moderate depletion  Posterior Calf Region Moderate depletion  Edema (RD Assessment)  Mild  Hair Reviewed  Eyes Reviewed  Mouth Reviewed  Skin Reviewed  Nails Reviewed   Diet Order:   Diet Order             DIET - DYS 1 Room service appropriate? Yes; Fluid consistency: Thin  Diet effective now                  EDUCATION NEEDS:   No education needs have been identified at this time  Skin:  Skin Assessment: Reviewed RN Assessment (incision head, abdomen and neck)  Last BM:  8/5- type 6  Height:   Ht Readings from Last 1 Encounters:  01/16/21 '5\' 8"'  (1.727 m)    Weight:   Wt Readings from Last 1 Encounters:  01/22/21 75.6 kg    Ideal Body Weight:  70 kg  Estimated Nutritional Needs:   Kcal:  1900-2200kcal/day  Protein:  95-110g/day  Fluid:  1.8-2.1L/day  Koleen Distance MS, RD, LDN Please refer to [facility] for RD and/or RD on-call/weekend/after hours pager

## 2021-01-22 NOTE — Progress Notes (Signed)
   Progress Note  History: Eric [name] is POD#14 from right frontal VP shunt placement for hydrocephalus. He has dementia at baseline which was exacerbated by anesthesia from his recent surgery. He has suffered from an extended postoperative delirium which has been clearing.   Physical Exam: Vitals:   01/21/21 2021 01/22/21 0356  BP:  (!) 152/69  Pulse:  82  Resp:  18  Temp:  97.8 F (36.6 C)  SpO2: 93% 95%    Drowsy but arousable. Oriented to person and hospital. Unable to accurately state the year or specific hospital. Following commands All 3 incisions are c/d/I. Sutures removed without complication  Abdomen soft  Data:  Recent Labs  Lab 01/17/21 0441 01/18/21 0443 01/19/21 0444  NA 137 136 134*  K 4.2 4.0 4.1  CL 102 99 99  CO2 '28 29 28  '$ BUN 22 24* 23  CREATININE 0.78 0.76 0.84  GLUCOSE 228* 280* 307*  CALCIUM 8.8* 8.6* 8.5*    No results for input(s): AST, ALT, ALKPHOS in the last 168 hours.  Invalid input(s): TBILI    Recent Labs  Lab 01/19/21 0444  WBC 12.5*  HGB 14.8  HCT 42.0  PLT 236    No results for input(s): APTT, INR in the last 168 hours.       Other tests/results: Stunt series completed post-op without noted complication.   CT head 01/10/21 showed good placement of catheter without any hemorrhage  Assessment/Plan:  Eric [name]  is a 75 y.o male s/p right frontal VP shunt. He is improving from post-operative delirium.  - DVT prophylaxis (On Heparin 5,000 q8) - PTOT  - Appreciate medicine  - Dispo planning is underway  Cooper Render PA-C Neurosurgery

## 2021-01-22 NOTE — Progress Notes (Signed)
Daily Progress Note   Patient Name: Eric [name]       Date: 01/22/2021 DOB: 1946-01-14  Age: 75 y.o. MRN#: [id number] Attending Physician: Deetta Perla, MD Primary Care Physician: Ileana Roup, MD Admit Date: 01/08/2021  Reason for Consultation/Follow-up: Establishing goals of care  Subjective: Patient is sleeping soundly with sitter at bedside. Mittens and DHT have been removed. Per sitter he ate all of his breakfast. She states he did require medication this morning to calm him as he became agitated with staff. Spoke with wife via phone in follow up as we discussed Friday. Discussed his status. Discussed multiple scenarios. Currently working on SNF placement.    Would recommend palliative to follow outpatient for [place] conversations over time.   Length of Stay: 14  Current Medications: Scheduled Meds:   Chlorhexidine Gluconate Cloth  6 each Topical Daily   finasteride  5 mg Oral Nightly   haloperidol lactate       heparin injection (subcutaneous)  5,000 Units Subcutaneous Q8H   hydrochlorothiazide  25 mg Per Tube Daily   And   lisinopril  20 mg Per Tube Daily   insulin aspart  0-15 Units Subcutaneous TID WC   insulin aspart  0-5 Units Subcutaneous QHS   insulin aspart  3 Units Subcutaneous TID WC   insulin glargine-yfgn  20 Units Subcutaneous Daily   ipratropium-albuterol  3 mL Nebulization BID   mouth rinse  15 mL Mouth Rinse BID   melatonin  10 mg Per Tube QHS   pantoprazole (PROTONIX) IV  40 mg Intravenous Daily   senna  1 tablet Per Tube BID   tamsulosin  0.4 mg Oral QPC supper   thiamine injection  100 mg Intravenous Daily   vitamin B-12  1,000 mcg Per Tube Daily    Continuous Infusions:  sodium chloride Stopped (01/17/21 1344)    PRN Meds: sodium chloride,  acetaminophen **OR** acetaminophen, bisacodyl, bismuth subsalicylate, ipratropium-albuterol, LORazepam **OR** LORazepam, ondansetron **OR** ondansetron (ZOFRAN) IV  Physical Exam Constitutional:      Comments: Eyes closed.             Vital Signs: BP 114/64 (BP Location: Right Leg)   Pulse 85   Temp 97.8 F (36.6 C) (Axillary)   Resp 18   Ht '5\' 8"'$  (1.727 m)   Wt 75.6  kg   SpO2 100%   BMI 25.34 kg/m  SpO2: SpO2: 100 % O2 Device: O2 Device: Room Air O2 Flow Rate: O2 Flow Rate (L/min): 2 L/min  Intake/output summary:  Intake/Output Summary (Last 24 hours) at 01/22/2021 1049 Last data filed at 01/21/2021 1841 Gross per 24 hour  Intake 240 ml  Output 300 ml  Net -60 ml   LBM: Last BM Date: 01/19/21 Baseline Weight: Weight: 79.8 kg Most recent weight: Weight: 75.6 kg         Patient Active Problem List   Diagnosis Date Noted   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 symptoms    Diabetes mellitus without complication (HCC)    Hypertension    Acute metabolic encephalopathy    Leukocytosis    GERD (gastroesophageal reflux disease)    Nasogastric tube present    S/P VP shunt    Hydrocephalus ([place]) 01/08/2021    Palliative Care Assessment & Plan     Recommendations/Plan: Patient is unable to have a [place] conversation this morning as wife had hoped. Recommend outpatient palliative to follow for GOC as current palliative consult has just been discontinued.   Code Status:    Code Status Orders  (From admission, onward)           Start     Ordered   01/08/21 1350  Full code  Continuous        01/08/21 1349           Code Status History     This patient has a current code status but no historical code status.       Prognosis:  Poor overall  Thank you for allowing the Palliative Medicine Team to assist in the care of this patient.       Total Time 25 min Prolonged Time Billed  no       Greater than 50%  of this time was spent counseling and  coordinating care related to the above assessment and plan.  Asencion Gowda, NP  Please contact Palliative Medicine Team phone at [phone] for questions and concerns.

## 2021-01-22 NOTE — Progress Notes (Signed)
PHARMACIST - PHYSICIAN COMMUNICATION  DR:   Lacinda Axon  CONCERNING: IV to Oral Route Change Policy  RECOMMENDATION: This patient is receiving pantoprazole/thiamine by the intravenous route.  Based on criteria approved by the Pharmacy and Therapeutics Committee, the intravenous medication(s) is/are being converted to the equivalent oral dose form(s).   DESCRIPTION: These criteria include: The patient is eating (either orally or via tube) and/or has been taking other orally administered medications for a least 24 hours The patient has no evidence of active gastrointestinal bleeding or impaired GI absorption (gastrectomy, short bowel, patient on TNA or NPO).  If you have questions about this conversion, please contact the Pharmacy Department  '[]'$   [phone] )  [facility] '[x]'$   [phone] )  [facility] '[]'$   [phone] )  [facility] '[]'$   [phone] )  [facility] '[]'$   [phone] )  [place], PharmD, BCPS Clinical Pharmacist 01/22/2021 2:29 PM

## 2021-01-22 NOTE — Progress Notes (Signed)
Physical Therapy Treatment Patient Details Name: Eric [name] MRN: [id number] DOB: [birth year] Today's Date: 01/22/2021    History of Present Illness      PT Comments    Pt appears to have increased confusion and agitation requiring sitter in the room to prevent falls and injuries. PT will attempt to see pt again later today if time permits.  However, pt sat on the EOB for ~5 mins with Max assist  and sat on EOB with close sup while opt remained restless and agitated. Today, Functional decline noted due to change in cognitive status noted.   Follow Up Recommendations  SNF     Equipment Recommendations  Other (comment)    Recommendations for Other Services       Precautions / Restrictions Precautions Precautions: Fall Precaution Comments: hand mitts Restrictions Weight Bearing Restrictions: No    Mobility  Bed Mobility Overal bed mobility: Needs Assistance Bed Mobility: Supine to Sit Rolling: Max assist;+2 for safety/equipment   Supine to sit: +2 for physical assistance;Max assist Sit to supine: Max assist;+2 for physical assistance        Transfers                    Ambulation/Gait                 Stairs             Wheelchair Mobility    Modified Rankin (Stroke Patients Only)       Balance Overall balance assessment: Needs assistance Sitting-balance support: Feet supported;Bilateral upper extremity supported Sitting balance-Leahy Scale: Fair Sitting balance - Comments: agitated upon entering room , sitter present Postural control: Posterior lean                                  Cognition Arousal/Alertness: Lethargic Behavior During Therapy: Agitated;Restless;Impulsive Overall Cognitive Status: Impaired/Different from baseline                   Orientation Level: Disoriented to;Person;Place;Time;Situation             General Comments: Overall confused, combative and no carryover.      Exercises       General Comments General comments (skin integrity, edema, etc.): Pt sat on the EOB with Min to sup of 1 person with Occ Mod asssit. For ~ 5 mins.      Pertinent Vitals/Pain      Home Living                      Prior Function            PT Goals (current goals can now be found in the care plan section) Acute Rehab PT Goals Patient Stated Goal: none stated PT Goal Formulation: With family Time For Goal Achievement: 01/25/21 Potential to Achieve Goals: Fair Progress towards PT goals: Progressing toward goals    Frequency    7X/week      PT Plan Current plan remains appropriate    Co-evaluation     PT goals addressed during session: Balance        AM-PAC PT "6 Clicks" Mobility   Outcome Measure  Help needed turning from your back to your side while in a flat bed without using bedrails?: A Lot Help needed moving from lying on your back to sitting on the side of a flat bed without using bedrails?: A  Lot Help needed moving to and from a bed to a chair (including a wheelchair)?: A Lot Help needed standing up from a chair using your arms (e.g., wheelchair or bedside chair)?: Total Help needed to walk in hospital room?: Total Help needed climbing 3-5 steps with a railing? : Total 6 Click Score: 9    End of Session   Activity Tolerance: Patient limited by lethargy;Treatment limited secondary to agitation Patient left: in bed;with bed alarm set;with nursing/sitter in room Nurse Communication: Mobility status PT Visit Diagnosis: Unsteadiness on feet (R26.81);Muscle weakness (generalized) (M62.81);Other abnormalities of gait and mobility (R26.89)     Time: [id number] PT Time Calculation (min) (ACUTE ONLY): 9 min  Charges:  $Therapeutic Activity: 8-22 mins                     Sanyia Dini PT DPT 12:55 PM,01/22/21

## 2021-01-22 NOTE — Progress Notes (Addendum)
PROGRESS NOTE  Consult-progress note  Eric [name]  [id number] DOB: Aug 13, 1945 DOA: 01/08/2021 PCP: Ileana Roup, MD   Brief Narrative: Taken from consult note. Eric [name] is an 75 y.o. male with PMH of hypertension, hyperlipidemia, diabetes mellitus, GERD, depression, anxiety, BPH, hydrocephalus, who is admitted by neurosurgery for VP shunt placement for hydrocephalus. We are asked to to consult for management of chronic medical issues and altered mental status/delirium.   Pt is POD #6 from right frontal VP shunt placement for hydrocephalus. After the surgery, pt developed confusion, agitation, suspecting posterior surgery delirium.  PCCM was consulted.  Patient was initially treated with Precedex.  He has been off Precedex for more than 24 hours.  Patient is currently on tube feeding.  Per his wife at the bedside, at his normal baseline, patient is alert and orientated x3.  Currently patient knows his own name, but is not orientated to the place and time. He moves all extremities. 8/1: Little more alert but remains lethargic. 8/2: Initial swallow evaluation with increased risk of aspiration, he was able to take some applesauce and nectar thick with swallow team today.  Remained agitated, a lot of behavioral issues. Repeat CT head was without any new abnormality. 8/3: Tapering valproic acid, added insulin coverage for tube feeds 8/4: Lethargic today 8/5: Remove DHT 8/6: Tolerating oral diet 8/7: More awake and alert during the day but got very restless and agitated by evening requiring sitter and Ativan 8/8: Safety sitter in place and Ativan as needed  Subjective: Got very agitated and restless last evening requiring safety sitter and use of Ativan.  He would not open his eyes or communicate with me  Assessment & Plan:   Principal Problem:   Hydrocephalus ([place]) Active Problems:   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 symptoms   Diabetes mellitus without complication  (HCC)   Hypertension   Acute metabolic encephalopathy   Leukocytosis   GERD (gastroesophageal reflux disease)   Nasogastric tube present   S/P VP shunt  Hydrocephalus ([place]): s/p of VP shunt placement. -Management per neurosurgery  Acute metabolic encephalopathy: Likely due to postsurgical delirium.  His mental status is waxing and waning -Was quite restless and agitated last evening requiring safety sitter and use of Ativan.  His VPA is tapered off per neurology and stopped on 8/7. -Patient was cleared for diet by speech therapy and Dobbhoff was removed.  We will request neuro to reevaluate  Upper respiratory secretions and cough  -Continue chest PT, chest x-ray on 8/5 shows no acute abnormality.   Net IO Since Admission: -3,163.84 mL [01/22/21 1329]  -Suction secretions as needed -Aspiration precautions -Give 40 mg of IV Lasix today   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 symptoms -Continue Proscar and Flomax   Diabetes mellitus without complication [facility]): Recent A1c 6.8,  -Sliding scale insulin.  Continue insulin aspart 3 units subcu 3 times daily with meals and increase Semglee to 30 units subcu daily for better blood sugar control   Hypertension -Continue lisinopril, HCTZ   Leukocytosis: Stable no source of infection identified.  No fever.  Likely reactive, procalcitonin negative.  UA is not very impressive.  Blood cultures remain negative. -Monitor CBC   GERD (gastroesophageal reflux disease) -Protonix  Electrolyte abnormalities.  Replete and recheck  Objective: Vitals:   01/22/21 0356 01/22/21 0359 01/22/21 0730 01/22/21 1149  BP: (!) 152/69  114/64 106/65  Pulse: 82  85 95  Resp: '18  18 17  '$ Temp: 97.8 F (36.6 C)  98.2 F (36.8 C)  TempSrc: Axillary   Axillary  SpO2: 95%  100% 99%  Weight:  75.6 kg    Height:        Intake/Output Summary (Last 24 hours) at 01/22/2021 1329 Last data filed at 01/22/2021 1152 Gross per 24 hour  Intake 360 ml  Output  850 ml  Net -490 ml   Filed Weights   01/20/21 0500 01/21/21 0216 01/22/21 0359  Weight: 77.5 kg 75.7 kg 75.6 kg    Examination: General.  Chronically ill-appearing, elderly man in no acute distress.  Pulmonary.  Lungs clear bilaterally, normal respiratory effort. CV.  Regular rate and rhythm, no JVD, rub or murmur. Abdomen.  Soft, nontender, nondistended, BS positive. CNS.  Somnolent and confused, no apparent focal deficit Extremities.  No edema, no cyanosis, pulses intact and symmetrical. Psychiatry.  Judgment and insight appears impaired.  Level of care: Progressive Cardiac  All the records are reviewed and case discussed with Care Management/Social Worker. Management plans discussed with the patient, nursing and they are in agreement.   Data Reviewed: I have personally reviewed following labs and imaging studies  CBC: Recent Labs  Lab 01/19/21 0444  WBC 12.5*  HGB 14.8  HCT 42.0  MCV 95.2  PLT [id number]   Basic Metabolic Panel: Recent Labs  Lab 01/16/21 0356 01/17/21 0441 01/18/21 0443 01/19/21 0444  NA 141 137 136 134*  K 3.2* 4.2 4.0 4.1  CL 105 102 99 99  CO2 '29 28 29 28  '$ GLUCOSE 229* 228* 280* 307*  BUN 26* 22 24* 23  CREATININE 0.76 0.78 0.76 0.84  CALCIUM 8.7* 8.8* 8.6* 8.5*  MG 2.3 2.2 2.4  --   PHOS 2.3* 2.4* 2.4*  --    GFR: Estimated Creatinine Clearance: 72.4 mL/min (by C-G formula based on SCr of 0.84 mg/dL). Liver Function Tests: No results for input(s): AST, ALT, ALKPHOS, BILITOT, PROT, ALBUMIN in the last 168 hours.  No results for input(s): LIPASE, AMYLASE in the last 168 hours. No results for input(s): AMMONIA in the last 168 hours.  Coagulation Profile: No results for input(s): INR, PROTIME in the last 168 hours. Cardiac Enzymes: No results for input(s): CKTOTAL, CKMB, CKMBINDEX, TROPONINI in the last 168 hours. BNP (last 3 results) No results for input(s): PROBNP in the last 8760 hours. HbA1C: No results for input(s): HGBA1C in the  last 72 hours. CBG: Recent Labs  Lab 01/21/21 1147 01/21/21 1622 01/21/21 1951 01/22/21 0734 01/22/21 1151  GLUCAP 235* 237* 201* 158* 241*   Lipid Profile: No results for input(s): CHOL, HDL, LDLCALC, TRIG, CHOLHDL, LDLDIRECT in the last 72 hours. Thyroid Function Tests: No results for input(s): TSH, T4TOTAL, FREET4, T3FREE, THYROIDAB in the last 72 hours. Anemia Panel: No results for input(s): VITAMINB12, FOLATE, FERRITIN, TIBC, IRON, RETICCTPCT in the last 72 hours. Sepsis Labs: Recent Labs  Lab 01/16/21 0356 01/17/21 0441  PROCALCITON <0.10 <0.10    Recent Results (from the past 240 hour(s))  CULTURE, BLOOD (ROUTINE X 2) w Reflex to ID Panel     Status: None   Collection Time: 01/15/21  8:37 AM   Specimen: BLOOD LEFT HAND  Result Value Ref Range Status   Specimen Description BLOOD LEFT HAND  Final   Special Requests   Final    BOTTLES DRAWN AEROBIC AND ANAEROBIC Blood Culture adequate volume   Culture   Final    NO GROWTH 5 DAYS Performed at [facility], [address] [place], [place] [address]  Report Status 01/20/2021 FINAL  Final  CULTURE, BLOOD (ROUTINE X 2) w Reflex to ID Panel     Status: None   Collection Time: 01/15/21  8:38 AM   Specimen: BLOOD LEFT FOREARM  Result Value Ref Range Status   Specimen Description BLOOD LEFT FOREARM  Final   Special Requests   Final    BOTTLES DRAWN AEROBIC AND ANAEROBIC Blood Culture adequate volume   Culture   Final    NO GROWTH 5 DAYS Performed at [facility], [address], [place], [place] [address]    Report Status 01/20/2021 FINAL  Final      Radiology Studies: No results found.  Scheduled Meds:  Chlorhexidine Gluconate Cloth  6 each Topical Daily   finasteride  5 mg Oral Nightly   heparin injection (subcutaneous)  5,000 Units Subcutaneous Q8H   hydrochlorothiazide  25 mg Per Tube Daily   And   lisinopril  20 mg Per Tube Daily   insulin aspart  0-15 Units Subcutaneous TID WC    insulin aspart  0-5 Units Subcutaneous QHS   insulin aspart  3 Units Subcutaneous TID WC   insulin glargine-yfgn  20 Units Subcutaneous Daily   ipratropium-albuterol  3 mL Nebulization BID   mouth rinse  15 mL Mouth Rinse BID   melatonin  10 mg Per Tube QHS   pantoprazole (PROTONIX) IV  40 mg Intravenous Daily   senna  1 tablet Per Tube BID   tamsulosin  0.4 mg Oral QPC supper   thiamine injection  100 mg Intravenous Daily   vitamin B-12  1,000 mcg Per Tube Daily   Continuous Infusions:  sodium chloride Stopped (01/17/21 1344)     LOS: 14 days   Time spent: 15 minutes. More than 50% of the time was spent in counseling/coordination of care  Max Sane, MD [facility]  If 7PM-7AM, please contact night-coverage [email or web address]  01/22/2021, 1:29 PM   This record has been created using Systems analyst. Errors have been sought and corrected,but may not always be located. Such creation errors do not reflect on the standard of care.

## 2021-01-23 DIAGNOSIS — G9341 Metabolic encephalopathy: Secondary | ICD-10-CM

## 2021-01-23 DIAGNOSIS — E1169 Type 2 diabetes mellitus with other specified complication: Secondary | ICD-10-CM

## 2021-01-23 DIAGNOSIS — G919 Hydrocephalus, unspecified: Secondary | ICD-10-CM | POA: Diagnosis not present

## 2021-01-23 LAB — BASIC METABOLIC PANEL
Anion gap: 9 (ref 5–15)
BUN: 33 mg/dL — ABNORMAL HIGH (ref 8–23)
CO2: 27 mmol/L (ref 22–32)
Calcium: 8.9 mg/dL (ref 8.9–10.3)
Chloride: 100 mmol/L (ref 98–111)
Creatinine, Ser: 0.93 mg/dL (ref 0.61–1.24)
GFR, Estimated: >60 mL/min (ref >60)
Glucose, Bld: 128 mg/dL — ABNORMAL HIGH (ref 70–99)
Potassium: 4.4 mmol/L (ref 3.5–5.1)
Sodium: 136 mmol/L (ref 135–145)

## 2021-01-23 LAB — GLUCOSE, CAPILLARY
Glucose-Capillary: 139 mg/dL — ABNORMAL HIGH (ref 70–99)
Glucose-Capillary: 150 mg/dL — ABNORMAL HIGH (ref 70–99)
Glucose-Capillary: 222 mg/dL — ABNORMAL HIGH (ref 70–99)

## 2021-01-23 LAB — CBC
HCT: 42.8 % (ref 39.0–52.0)
Hemoglobin: 15.1 g/dL (ref 13.0–17.0)
MCH: 34.8 pg — ABNORMAL HIGH (ref 26.0–34.0)
MCHC: 35.3 g/dL (ref 30.0–36.0)
MCV: 98.6 fL (ref 80.0–100.0)
Platelets: 302 10*3/uL (ref 150–400)
RBC: 4.34 MIL/uL (ref 4.22–5.81)
RDW: 12.1 % (ref 11.5–15.5)
WBC: 10 10*3/uL (ref 4.0–10.5)
nRBC: 0 % (ref 0.0–0.2)

## 2021-01-23 MED ORDER — HALOPERIDOL LACTATE 5 MG/ML IJ SOLN
1.0000 mg | Freq: Four times a day (QID) | INTRAMUSCULAR | Status: DC | PRN
  Administered 2021-01-23: 1 mg via INTRAVENOUS
  Filled 2021-01-23 (×2): qty 1

## 2021-01-23 MED ORDER — VITAMIN B-12 1000 MCG PO TABS: 2000.0000 ug | ORAL_TABLET | Freq: Every day | ORAL | Status: DC

## 2021-01-23 NOTE — Progress Notes (Signed)
Eeg done 

## 2021-01-23 NOTE — Progress Notes (Addendum)
Subjective: Patient last seen by Neurology on 7/28 for multifactorial hyperactive delirium. Neurology was called to see in follow up as the patient contiues to be encephalopathic. 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   Objective: Current vital signs: BP 131/88 (BP Location: Left Arm)   Pulse 89   Temp 97.9 F (36.6 C)   Resp 16   Ht '5\' 8"'$  (1.727 m)   Wt 75.6 kg   SpO2 99%   BMI 25.34 kg/m  Vital signs in last 24 hours: Temp:  [97.9 F (36.6 C)-98.2 F (36.8 C)] 97.9 F (36.6 C) (08/09 0719) Pulse Rate:  [89-100] 89 (08/09 0719) Resp:  [16-19] 16 (08/09 0719) BP: (106-134)/(62-88) 131/88 (08/09 0719) SpO2:  [98 %-100 %] 99 % (08/09 0600)  Intake/Output from previous day: 08/08 0701 - 08/09 0700 In: 840 [P.O.:840] Out: 2250 [Urine:2250] Intake/Output this shift: No intake/output data recorded. Nutritional status:  Diet Order             DIET - DYS 1 Room service appropriate? Yes; Fluid consistency: Thin  Diet effective now                  HEENT: Shunt catheter surgical scar C/D/I Lungs: Respirations unlabored.  Ext: Wearing mitts, which were removed for the exam.   Neurologic Exam: Ment: Awake. Poor attention. Abulic. Does not spontaneously verbalize but will answer questions with 1-2 word, hypophonic and dysarthric answers. Often requires commands and questions to be asked multiple times in order to obtain a response. Increased latencies of verbal and motor responses. Will grip examiner's hands to command, but will not readily release when asked, requiring examiner to slowly pull fingers out of patient's grip bilaterally. Oriented to "2023" and "April". Does not recall the city, but can correctly identify the state. Insight is severely  compromised.  CN: PERRL. EOMI. Will fixate and track. Smile is symmetric. Hypophonic speech.  Motor: Will grip and flex at elbows with 5/5 strength. Extends legs at knees with 5/5 strength. No asymmetry Sensory: Reacts to touch x 4.  Reflexes: 1+ in upper extremities and patellae.  Cerebellar: Did not cooperate with coordination testing.   Lab Results: Results for orders placed or performed during the hospital encounter of 01/08/21 (from the past 48 hour(s))  Glucose, capillary     Status: Abnormal   Collection Time: 01/21/21 11:47 AM  Result Value Ref Range   Glucose-Capillary 235 (H) 70 - 99 mg/dL    Comment: Glucose reference range applies only to samples taken after fasting for at least 8 hours.  Glucose, capillary     Status: Abnormal   Collection Time: 01/21/21  4:22 PM  Result Value Ref Range   Glucose-Capillary 237 (H) 70 - 99 mg/dL    Comment: Glucose reference range applies only to samples taken after fasting for at least 8 hours.  Glucose, capillary     Status: Abnormal   Collection Time: 01/21/21  7:51 PM  Result Value Ref Range   Glucose-Capillary 201 (H) 70 - 99 mg/dL    Comment: Glucose reference range applies only to samples taken after fasting for at least 8 hours.  Glucose, capillary     Status: Abnormal   Collection Time: 01/22/21  7:34 AM  Result Value Ref Range   Glucose-Capillary 158 (H) 70 - 99 mg/dL  Comment: Glucose reference range applies only to samples taken after fasting for at least 8 hours.   Comment 1 Notify RN    Comment 2 Document in Chart   Glucose, capillary     Status: Abnormal   Collection Time: 01/22/21 11:51 AM  Result Value Ref Range   Glucose-Capillary 241 (H) 70 - 99 mg/dL    Comment: Glucose reference range applies only to samples taken after fasting for at least 8 hours.  Glucose, capillary     Status: None   Collection Time: 01/22/21  4:22 PM  Result Value Ref Range   Glucose-Capillary 87 70 - 99 mg/dL    Comment: Glucose  reference range applies only to samples taken after fasting for at least 8 hours.  Glucose, capillary     Status: Abnormal   Collection Time: 01/22/21  8:54 PM  Result Value Ref Range   Glucose-Capillary 263 (H) 70 - 99 mg/dL    Comment: Glucose reference range applies only to samples taken after fasting for at least 8 hours.  CBC     Status: Abnormal   Collection Time: 01/23/21  5:58 AM  Result Value Ref Range   WBC 10.0 4.0 - 10.5 K/uL   RBC 4.34 4.22 - 5.81 MIL/uL   Hemoglobin 15.1 13.0 - 17.0 g/dL   HCT 42.8 39.0 - 52.0 %   MCV 98.6 80.0 - 100.0 fL   MCH 34.8 (H) 26.0 - 34.0 pg   MCHC 35.3 30.0 - 36.0 g/dL   RDW 12.1 11.5 - 15.5 %   Platelets 302 150 - 400 K/uL   nRBC 0.0 0.0 - 0.2 %    Comment: Performed at [facility], [address], [place], [place] [id number]  Basic metabolic panel     Status: Abnormal   Collection Time: 01/23/21  5:58 AM  Result Value Ref Range   Sodium 136 135 - 145 mmol/L   Potassium 4.4 3.5 - 5.1 mmol/L   Chloride 100 98 - 111 mmol/L   CO2 27 22 - 32 mmol/L   Glucose, Bld 128 (H) 70 - 99 mg/dL    Comment: Glucose reference range applies only to samples taken after fasting for at least 8 hours.   BUN 33 (H) 8 - 23 mg/dL   Creatinine, Ser 0.93 0.61 - 1.24 mg/dL   Calcium 8.9 8.9 - 10.3 mg/dL   GFR, Estimated >60 >60 mL/min    Comment: (NOTE) Calculated using the CKD-EPI Creatinine Equation (2021)    Anion gap 9 5 - 15    Comment: Performed at [facility], [address] [place], [place] [address]  Glucose, capillary     Status: Abnormal   Collection Time: 01/23/21  7:19 AM  Result Value Ref Range   Glucose-Capillary 139 (H) 70 - 99 mg/dL    Comment: Glucose reference range applies only to samples taken after fasting for at least 8 hours.    Recent Results (from the past 240 hour(s))  CULTURE, BLOOD (ROUTINE X 2) w Reflex to ID Panel     Status: None   Collection Time: 01/15/21  8:37 AM   Specimen: BLOOD LEFT HAND   Result Value Ref Range Status   Specimen Description BLOOD LEFT HAND  Final   Special Requests   Final    BOTTLES DRAWN AEROBIC AND ANAEROBIC Blood Culture adequate volume   Culture   Final    NO GROWTH 5 DAYS Performed at [facility], [place],  [place], [place] [address]    Report Status 01/20/2021 FINAL  Final  CULTURE, BLOOD (ROUTINE X 2) w Reflex to ID Panel     Status: None   Collection Time: 01/15/21  8:38 AM   Specimen: BLOOD LEFT FOREARM  Result Value Ref Range Status   Specimen Description BLOOD LEFT FOREARM  Final   Special Requests   Final    BOTTLES DRAWN AEROBIC AND ANAEROBIC Blood Culture adequate volume   Culture   Final    NO GROWTH 5 DAYS Performed at [facility], [address] [place], [place] [address]    Report Status 01/20/2021 FINAL  Final    Lipid Panel No results for input(s): CHOL, TRIG, HDL, CHOLHDL, VLDL, LDLCALC in the last 72 hours.  Studies/Results: No results found.  Medications: Scheduled:  Chlorhexidine Gluconate Cloth  6 each Topical Daily   feeding supplement (NEPRO CARB STEADY)  237 mL Oral TID BM   finasteride  5 mg Oral Nightly   furosemide  40 mg Intravenous Once   heparin injection (subcutaneous)  5,000 Units Subcutaneous Q8H   hydrochlorothiazide  25 mg Per Tube Daily   And   lisinopril  20 mg Per Tube Daily   insulin aspart  0-15 Units Subcutaneous TID WC   insulin aspart  0-5 Units Subcutaneous QHS   insulin aspart  3 Units Subcutaneous TID WC   insulin glargine-yfgn  30 Units Subcutaneous Daily   ipratropium-albuterol  3 mL Nebulization BID   mouth rinse  15 mL Mouth Rinse BID   melatonin  10 mg Per Tube QHS   multivitamin with minerals  1 tablet Oral Daily   pantoprazole  40 mg Oral Daily   senna  1 tablet Per Tube BID   tamsulosin  0.4 mg Oral QPC supper   thiamine  100 mg Oral Daily   vitamin B-12  1,000 mcg Per Tube Daily   Continuous:  sodium chloride Stopped (01/17/21 1344)     Assessment: 75 year old male with multifactorial hyperactive delirium. Per Dr. 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 He initially presented to the hospital on 7/25 for evaluation of a several month history of shuffling gait, poor balance, increasing falls as well as confusion and memory problems that had been worsening over several months. 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 This was evaluated by Dr. Lacinda Axon of neurosurgery on an outpatient basis and decision was made to proceed with shunt which was completed on 7/25.  Post-op was with acute delirium/agitation felt to be due to complication of anesthesia plus hyponatremia (Na+ 129) requiring Precedex drip. Unfortunately the patient has continued to be agitated since. - Exam today is consistent with an encephalopathy, most likely a hospital delirium on underlying dementia. He is abulic with hypophonic 1-2 word answers to questions, increased latencies of responses to questions and commands and is disoriented, but is cooperative with exam and exhibits full strength without focal weakness. Per communication with staff, he has slowly improved in terms of communicativeness, ability to briefly ambulate, follow commands and with decreasing but still present intermittent agitation. Of note, he received 2 mg IV Haldol, 1 mg po Ativan and 1 mg IV Ativan yesterday (Monday) for agitation. Also received Ativan this morning for agitation. He has swung at and kicked personnel while agitated during this hospitalization. At the time of today's exam he is not agitated and attempts to  cooperate.  - No clinical seizure activity on exam today. Staff also has not witnessed any seizure-like events.  -Thiamine level came back normal.  - Vitamin B12 somewhat low by neurological standards at 332. Has had some supplementation this admission but now  d/c'd.  - Benadryl is on his PRN meds list for sleep, but he has not received any doses recently. This antihistamine is also a potent anticholinergic which can cause cognitive impairment.  - AdMark (Alzheimer's biomarkers) were added onto CSF testing by Dr. Curly Shores on 7/27. Still in process. Can be followed up by an outpatient neurologist given will likely take many days to result  - Recent ammonia normal. AST/ALT normal. TSH normal. RPR nonreactive. HIV negative.  - BUN mildly elevated at 33, but Cr normal.  - WBC normal - Recent valproic acid therapeutic at 61 (7/29) - Recent EEG (7/27): This study is suggestive of severe diffuse encephalopathy, nonspecific to etiology. No seizures or epileptiform discharges were seen throughout the recording - Head CT from September 2020 compared to head CT from Oct 24, 2020. There has been some slight interval progression of ventricular size (progressive obstructive hydrocephalus versus component of hydrocephalus ex vacuo) as well stable 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  - Repeat head CT postoperatively revealed a right frontal approach shunt catheter. Ventricles were unchanged in size. The images were personally reviewed and his temporal pole atrophy and hippocampal atrophy are noted to be severe. I agree with Dr. 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   Overall Impression: - I agree with Dr. Curly Shores that frontotemporal lobar degeneration (FTLD) is  high on the DDx, is likely contributing to his declining cognition prior to admission and also is likely a significant contributor to his hospital delirium.   Recommendations:  - Continue high dose B12 supplementation with 1000 mcg po qd  - Continue PO thiamine supplementation. - Repeat EEG (ordered) - Continue to look for reversible causes of delirium (inadequate pain control must be balanced with the deliriogenic nature of opiates, continue to observe for infection, try to minimize disruptions overnight and keep patient awake during the day, keep patient interactions calm and quiet, try to minimize use of restraints with redirection by a sitter as soon as possible and safe from a postoperative perspective - Will need outpatient Neurology follow up for possible frontotemporal lobar degeneration (FTLD).  - MRI brain when able.   Addendum: Repeat EEG: This study is suggestive of moderate diffuse encephalopathy, nonspecific to etiology. No seizures or epileptiform discharges were seen throughout the recording.   LOS: 15 days   '@Electronically'$  signed: Dr. Kerney Elbe 01/23/2021  8:09 AM

## 2021-01-23 NOTE — Progress Notes (Signed)
Occupational Therapy Treatment Patient Details Name: NELDON KARSTEN MRN: [id number] DOB: 1946/05/22 Today's Date: 01/23/2021    History of present illness Patient is a 75 y.o. male with Hydrocephalus status post elective Right Frontal VP Shunt insertion.  Post-op with acute delirium/agitation felt to be due to complication of anesthesia plus Hyponatremia requiring Precedex drip.   OT comments  Pt seen for OT tx this date. Spouse and sitter present. Pt required initial verbal cues and sternal rub to improve alertness for session. With simple, repeated VC, pt able to perform bed mobility with MODA  +2. CGA-MIN A for static sitting balance. Pt tolerated sitting EOB >62mn for seated grooming task to wash face with set up and for shaving his face with MAX A. Pt was able to attempt to shave with mirror. MOD-MAX A x2 to stand EOB twice with bilateral handheld assist, cues for safety. Once in standing, pt urinated using external catheter. Pt agreeable to return to bed afterwards, endorsing fatigue. Pt demonstrated improvement in cognition today allowing for improved safety/independence with ADL tasks and standing attempts. Intermittent coughing noted. Sitter educated in bed positioning to support safety for future meals. Pt continues to benefit from skilled OT services. Continue to recommend SNF at this time given extensive assist required for ADL/mobility and to maximize safety.    Follow Up Recommendations  SNF    Equipment Recommendations  3 in 1 bedside commode;Tub/shower seat    Recommendations for Other Services      Precautions / Restrictions Precautions Precautions: Fall Precaution Comments: hand mitts Restrictions Weight Bearing Restrictions: No       Mobility Bed Mobility Overal bed mobility: Needs Assistance Bed Mobility: Supine to Sit;Sit to Supine     Supine to sit: Mod assist;Max assist;HOB elevated Sit to supine: Mod assist;Max assist;+2 for physical assistance   General bed  mobility comments: cues for sequencing, initiation, safety    Transfers Overall transfer level: Needs assistance Equipment used: 2 person hand held assist Transfers: Sit to/from Stand;Lateral/Scoot Transfers Sit to Stand: Mod assist;+2 physical assistance;Max assist        Lateral/Scoot Transfers: Mod assist;+2 physical assistance General transfer comment: simple repeated cues for lateral steps EOB, attempts to reach for counter requiring cues to redirect for bilateral handheld assist    Balance Overall balance assessment: Needs assistance Sitting-balance support: No upper extremity supported;Feet supported Sitting balance-Leahy Scale: Poor Sitting balance - Comments: CGA-MIN A for sitting balance   Standing balance support: Bilateral upper extremity supported Standing balance-Leahy Scale: Zero Standing balance comment: requires MOD A x2 to maintain                           ADL either performed or assessed with clinical judgement   ADL Overall ADL's : Needs assistance/impaired     Grooming: Sitting;Min guard;Set up;Wash/dry face Grooming Details (indicate cue type and reason): Pt tolerated sitting EOB with CGA-MIN A for sitting balance, set up for washcloth to wash face, and MAX A for shaving with pt attempting to shave with mirror for visual cues, wife assisting some intermittently and OT assisting some to ensure safety                   Toilet Transfer Details (indicate cue type and reason): Pt stood with MOD-MAX A x2 at EOB twice, MAX VC for safety, was able to urinate into external catheter while in standing with Min-MOD A x2 to maintain static standing balance  Vision Patient Visual Report: No change from baseline     Perception     Praxis      Cognition Arousal/Alertness: Awake/alert Behavior During Therapy: Impulsive (mild intermittent agitation, resolved after urinating) Overall Cognitive Status: History of cognitive  impairments - at baseline                                 General Comments: Per spouse and based on chart, pt noted with mild improvement in cognition this date, with simple, repeated cues, pt able to follow some commands with VC/TC and increased time for processing        Exercises     Shoulder Instructions       General Comments      Pertinent Vitals/ Pain       Pain Assessment: Faces Faces Pain Scale: No hurt  Home Living                                          Prior Functioning/Environment              Frequency  Min 2X/week        Progress Toward Goals  OT Goals(current goals can now be found in the care plan section)  Progress towards OT goals: Progressing toward goals  Acute Rehab OT Goals Patient Stated Goal: none stated OT Goal Formulation: With patient/family Time For Goal Achievement: 01/31/21 Potential to Achieve Goals: Good  Plan Discharge plan remains appropriate;Frequency remains appropriate    Co-evaluation                 AM-PAC OT "6 Clicks" Daily Activity     Outcome Measure   Help from another person eating meals?: A Lot Help from another person taking care of personal grooming?: A Lot Help from another person toileting, which includes using toliet, bedpan, or urinal?: Total Help from another person bathing (including washing, rinsing, drying)?: Total Help from another person to put on and taking off regular upper body clothing?: A Lot Help from another person to put on and taking off regular lower body clothing?: A Lot 6 Click Score: 10    End of Session    OT Visit Diagnosis: Unsteadiness on feet (R26.81);Muscle weakness (generalized) (M62.81);Other symptoms and signs involving cognitive function   Activity Tolerance Patient tolerated treatment well   Patient Left in bed;with call bell/phone within reach;with bed alarm set;with nursing/sitter in room;with family/visitor present;with  restraints reapplied (mitts)   Nurse Communication          Time: [phone] OT Time Calculation (min): 34 min  Charges: OT General Charges $OT Visit: 1 Visit OT Treatments $Self Care/Home Management : 23-37 mins  Hanley Hays, MPH, MS, OTR/L ascom [phone] 01/23/21, 11:45 AM

## 2021-01-23 NOTE — Procedures (Signed)
Patient Name: Eric [name]  MRN: [id number]  Epilepsy Attending: Lora Havens  Referring Physician/Provider: Dr Kerney Elbe Date: 01/23/2021 Duration: 22.26 mins  Patient history: 75 y.o male s/p right frontal VP shunt, continues to be encephalopathic. EEG to evaluate for seizure  Level of alertness: Awake  AEDs during EEG study: None  Technical aspects: This EEG study was done with scalp electrodes positioned according to the 10-20 International system of electrode placement. Electrical activity was acquired at a sampling rate of '500Hz'$  and reviewed with a high frequency filter of '70Hz'$  and a low frequency filter of '1Hz'$ . EEG data were recorded continuously and digitally stored.   Description: EEG showed continuous generalized 5 to 6 Hz theta as well as intermittent 2-'3hz'$  high amplitude rhythmic delta slowing. Photic driving was not seen during this photic stimulation.Hyperventilation was not performed.     ABNORMALITY - Intermittent rhythmic delta slow, generalized ( GRDA) - Continuous slow, generalized  IMPRESSION: This study is suggestive of moderate diffuse encephalopathy, nonspecific etiology. No seizures or epileptiform discharges were seen throughout the recording.   Eric [name]

## 2021-01-23 NOTE — Progress Notes (Signed)
PT Cancellation Note  Patient Details Name: Eric [name] MRN: [id number] DOB: Feb 28, 1946   Cancelled Treatment:    Reason Eval/Treat Not Completed: Patient at procedure or test/unavailable. Will attempt to consult pt tomorrow.    Joaquin Music PT DPT 2:01 PM,01/23/21

## 2021-01-23 NOTE — Progress Notes (Signed)
   Progress Note  History: Eric [name] is POD#15 from right frontal VP shunt placement for hydrocephalus. He has dementia at baseline which was exacerbated by anesthesia from his recent surgery. He has suffered from an extended postoperative delirium which has been clearing.   Physical Exam: Vitals:   01/23/21 0600 01/23/21 0719  BP: 123/78 131/88  Pulse: 91 89  Resp: 19 16  Temp: 97.9 F (36.6 C) 97.9 F (36.6 C)  SpO2: 99%     Sitting up eating breakfast. Oriented to person and year  Following commands All 3 incisions are c/d/I. Abdomen soft  Data:  Recent Labs  Lab 01/18/21 0443 01/19/21 0444 01/23/21 0558  NA 136 134* 136  K 4.0 4.1 4.4  CL 99 99 100  CO2 '29 28 27  '$ BUN 24* 23 33*  CREATININE 0.76 0.84 0.93  GLUCOSE 280* 307* 128*  CALCIUM 8.6* 8.5* 8.9    No results for input(s): AST, ALT, ALKPHOS in the last 168 hours.  Invalid input(s): TBILI    Recent Labs  Lab 01/19/21 0444 01/23/21 0558  WBC 12.5* 10.0  HGB 14.8 15.1  HCT 42.0 42.8  PLT 236 302    No results for input(s): APTT, INR in the last 168 hours.       Other tests/results: Stunt series completed post-op without noted complication.   CT head 01/10/21 showed good placement of catheter without any hemorrhage  Assessment/Plan:  Eric [name]  is a 75 y.o male s/p right frontal VP shunt. He is improving from post-operative delirium.  - DVT prophylaxis (On Heparin 5,000 q8) - PTOT  - Appreciate medicine's assistance with management  - Dispo planning is underway  Cooper Render PA-C Neurosurgery

## 2021-01-23 NOTE — Progress Notes (Signed)
PROGRESS NOTE    Eric [name]  [id number] DOB: Oct 21, 1945 DOA: 01/08/2021 PCP: Ileana Roup, MD  Assessment & Plan:   Principal Problem:   Hydrocephalus [name]) Active Problems:   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 symptoms   Diabetes mellitus without complication ([name])   Hypertension   Acute metabolic encephalopathy   Leukocytosis   GERD (gastroesophageal reflux disease)   Nasogastric tube present   S/P VP shunt   Hydrocephalus: s/p of VP shunt placement. Management per neurosurgery   Acute metabolic encephalopathy: likely due to postsurgical delirium. Labile mental status. VPA is tapered off per neurology and stopped on 8/7. Was cleared for diet by speech therapy and Dobbhoff was removed.  Neuro was re-consulted    Upper respiratory secretions and cough: continue chest PT, chest x-ray on 8/5 shows no acute abnormality. Suction prn. Aspiration precautions   BPH:  without obstruction/lower urinary tract symptoms. Continue on home dose of flomax, proscar  DM2: fairly well controlled w/ HbA1c 6.8. Continue on glargine, SSI w/ accuchecks  HTN: continue on lisinopril, HCTZ  Leukocytosis: resolved    GERD: continue on PPI      DVT prophylaxis: heparin  Code Status: full  Family Communication: discussed pt's care w/ pt's family at bedside and answered their questions  Disposition Plan:  as per primary team   Level of care: Progressive Cardiac  Status is: Inpatient  Remains inpatient appropriate because:Inpatient level of care appropriate due to severity of illness  Dispo: The patient is from: Home              Anticipated d/c is to: SNF              Patient currently is not medically stable to d/c.   Difficult to place patient : unclear    Consultants:  Hospitalist   Procedures:   Antimicrobials:  Subjective: Pt is lethargic from getting ativan  Objective: Vitals:   01/22/21 1918 01/22/21 1929 01/23/21 0600 01/23/21 0719  BP:  121/80  123/78 131/88  Pulse:  100 91 89  Resp:  '19 19 16  '$ Temp:  97.9 F (36.6 C) 97.9 F (36.6 C) 97.9 F (36.6 C)  TempSrc:  Oral Oral   SpO2: 98% 100% 99%   Weight:      Height:        Intake/Output Summary (Last 24 hours) at 01/23/2021 0832 Last data filed at 01/23/2021 0800 Gross per 24 hour  Intake 1080 ml  Output 2250 ml  Net -1170 ml   Filed Weights   01/20/21 0500 01/21/21 0216 01/22/21 0359  Weight: 77.5 kg 75.7 kg 75.6 kg    Examination:  General exam: Appears calm and comfortable  Respiratory system: diminished breath sounds b/l  Cardiovascular system: S1 & S2 +. No rubs, gallops or clicks.  Gastrointestinal system: Abdomen is nondistended, soft and nontender. Hypoactive bowel sounds heard. Central nervous system: Lethargic. Moves all extremities Psychiatry: Judgement and insight appear abnormal. Flat mood and affect    Data Reviewed: I have personally reviewed following labs and imaging studies  CBC: Recent Labs  Lab 01/19/21 0444 01/23/21 0558  WBC 12.5* 10.0  HGB 14.8 15.1  HCT 42.0 42.8  MCV 95.2 98.6  PLT 236 99991111   Basic Metabolic Panel: Recent Labs  Lab 01/17/21 0441 01/18/21 0443 01/19/21 0444 01/23/21 0558  NA 137 136 134* 136  K 4.2 4.0 4.1 4.4  CL 102 99 99 100  CO2 '28 29 28 27  '$ GLUCOSE 228* 280* 307*  128*  BUN 22 24* 23 33*  CREATININE 0.78 0.76 0.84 0.93  CALCIUM 8.8* 8.6* 8.5* 8.9  MG 2.2 2.4  --   --   PHOS 2.4* 2.4*  --   --    GFR: Estimated Creatinine Clearance: 65.4 mL/min (by C-G formula based on SCr of 0.93 mg/dL). Liver Function Tests: No results for input(s): AST, ALT, ALKPHOS, BILITOT, PROT, ALBUMIN in the last 168 hours. No results for input(s): LIPASE, AMYLASE in the last 168 hours. No results for input(s): AMMONIA in the last 168 hours. Coagulation Profile: No results for input(s): INR, PROTIME in the last 168 hours. Cardiac Enzymes: No results for input(s): CKTOTAL, CKMB, CKMBINDEX, TROPONINI in the last 168  hours. BNP (last 3 results) No results for input(s): PROBNP in the last 8760 hours. HbA1C: No results for input(s): HGBA1C in the last 72 hours. CBG: Recent Labs  Lab 01/22/21 0734 01/22/21 1151 01/22/21 1622 01/22/21 2054 01/23/21 0719  GLUCAP 158* 241* 87 263* 139*   Lipid Profile: No results for input(s): CHOL, HDL, LDLCALC, TRIG, CHOLHDL, LDLDIRECT in the last 72 hours. Thyroid Function Tests: No results for input(s): TSH, T4TOTAL, FREET4, T3FREE, THYROIDAB in the last 72 hours. Anemia Panel: No results for input(s): VITAMINB12, FOLATE, FERRITIN, TIBC, IRON, RETICCTPCT in the last 72 hours. Sepsis Labs: Recent Labs  Lab 01/17/21 0441  PROCALCITON <0.10    Recent Results (from the past 240 hour(s))  CULTURE, BLOOD (ROUTINE X 2) w Reflex to ID Panel     Status: None   Collection Time: 01/15/21  8:37 AM   Specimen: BLOOD LEFT HAND  Result Value Ref Range Status   Specimen Description BLOOD LEFT HAND  Final   Special Requests   Final    BOTTLES DRAWN AEROBIC AND ANAEROBIC Blood Culture adequate volume   Culture   Final    NO GROWTH 5 DAYS Performed at [facility] [address] [place], [place] [address]    Report Status 01/20/2021 FINAL  Final  CULTURE, BLOOD (ROUTINE X 2) w Reflex to ID Panel     Status: None   Collection Time: 01/15/21  8:38 AM   Specimen: BLOOD LEFT FOREARM  Result Value Ref Range Status   Specimen Description BLOOD LEFT FOREARM  Final   Special Requests   Final    BOTTLES DRAWN AEROBIC AND ANAEROBIC Blood Culture adequate volume   Culture   Final    NO GROWTH 5 DAYS Performed at [facility], [address], [place], [place] [address]    Report Status 01/20/2021 FINAL  Final         Radiology Studies: No results found.      Scheduled Meds:  Chlorhexidine Gluconate Cloth  6 each Topical Daily   feeding supplement (NEPRO CARB STEADY)  237 mL Oral TID BM   finasteride  5 mg Oral Nightly   furosemide  40 mg  Intravenous Once   heparin injection (subcutaneous)  5,000 Units Subcutaneous Q8H   hydrochlorothiazide  25 mg Per Tube Daily   And   lisinopril  20 mg Per Tube Daily   insulin aspart  0-15 Units Subcutaneous TID WC   insulin aspart  0-5 Units Subcutaneous QHS   insulin aspart  3 Units Subcutaneous TID WC   insulin glargine-yfgn  30 Units Subcutaneous Daily   ipratropium-albuterol  3 mL Nebulization BID   mouth rinse  15 mL Mouth Rinse BID   melatonin  10 mg Per Tube QHS  multivitamin with minerals  1 tablet Oral Daily   pantoprazole  40 mg Oral Daily   senna  1 tablet Per Tube BID   tamsulosin  0.4 mg Oral QPC supper   thiamine  100 mg Oral Daily   vitamin B-12  1,000 mcg Per Tube Daily   Continuous Infusions:  sodium chloride Stopped (01/17/21 1344)     LOS: 15 days    Time spent: 25 mins    Wyvonnia Dusky, MD [facility] Pager [phone]  If 7PM-7AM, please contact night-coverage 01/23/2021, 8:32 AM

## 2021-01-24 LAB — GLUCOSE, CAPILLARY
Glucose-Capillary: 165 mg/dL — ABNORMAL HIGH (ref 70–99)
Glucose-Capillary: 110 mg/dL — ABNORMAL HIGH (ref 70–99)
Glucose-Capillary: 112 mg/dL — ABNORMAL HIGH (ref 70–99)
Glucose-Capillary: 152 mg/dL — ABNORMAL HIGH (ref 70–99)

## 2021-01-24 MED ORDER — QUETIAPINE FUMARATE 25 MG PO TABS
50.0000 mg | ORAL_TABLET | Freq: Every day | ORAL | Status: DC
  Administered 2021-01-24 – 2021-01-25 (×2): 50 mg via ORAL
  Filled 2021-01-24 (×2): qty 2

## 2021-01-24 MED ORDER — GLYCOPYRROLATE 0.2 MG/ML IJ SOLN
0.1000 mg | Freq: Three times a day (TID) | INTRAMUSCULAR | Status: DC | PRN
  Filled 2021-01-24: qty 0.5

## 2021-01-24 MED ORDER — HALOPERIDOL LACTATE 5 MG/ML IJ SOLN
1.0000 mg | Freq: Four times a day (QID) | INTRAMUSCULAR | Status: AC | PRN
Start: 2021-01-24 — End: 2021-01-24
  Administered 2021-01-24: 1 mg via INTRAMUSCULAR

## 2021-01-24 MED ORDER — GLYCOPYRROLATE 1 MG PO TABS
1.0000 mg | ORAL_TABLET | Freq: Three times a day (TID) | ORAL | Status: DC | PRN
  Administered 2021-01-24: 1 mg via ORAL
  Filled 2021-01-24 (×2): qty 1

## 2021-01-24 NOTE — Progress Notes (Signed)
   Progress Note  History: DILLON HILLIE is POD#16 from right frontal VP shunt placement for hydrocephalus. He has dementia at baseline which was exacerbated by anesthesia from his recent surgery. He has suffered from an extended postoperative delirium which has been clearing.   Physical Exam: Vitals:   01/24/21 0537 01/24/21 0559  BP: 137/70   Pulse: (!) 108 77  Resp: 18   Temp: (!) 97.5 F (36.4 C) 98 F (36.7 C)  SpO2: 96%     Drowsy but arouses to voice. Oriented to person, place and year  Following simple 1 step commands  All 3 incisions are c/d/I. Abdomen soft  Data:  Recent Labs  Lab 01/18/21 0443 01/19/21 0444 01/23/21 0558  NA 136 134* 136  K 4.0 4.1 4.4  CL 99 99 100  CO2 '29 28 27  '$ BUN 24* 23 33*  CREATININE 0.76 0.84 0.93  GLUCOSE 280* 307* 128*  CALCIUM 8.6* 8.5* 8.9    No results for input(s): AST, ALT, ALKPHOS in the last 168 hours.  Invalid input(s): TBILI    Recent Labs  Lab 01/19/21 0444 01/23/21 0558  WBC 12.5* 10.0  HGB 14.8 15.1  HCT 42.0 42.8  PLT 236 302    No results for input(s): APTT, INR in the last 168 hours.       Other tests/results: Stunt series completed post-op without noted complication.   CT head 01/10/21 showed good placement of catheter without any hemorrhage  Assessment/Plan:  ALPHEUS MERRIMAN  is a 75 y.o male s/p right frontal VP shunt. He is improving from post-operative delirium.  - DVT prophylaxis (On Heparin 5,000 q8) - PTOT  - Appreciate medicine's assistance with management  - Dispo planning is underway  Cooper Render PA-C Neurosurgery

## 2021-01-24 NOTE — Progress Notes (Signed)
Physical Therapy Treatment Patient Details Name: Eric [name] MRN: [id number] DOB: [birth year] Today's Date: 01/24/2021    History of Present Illness Patient is a 75 y.o. male with Hydrocephalus status post elective Right Frontal VP Shunt insertion.  Post-op with acute delirium/agitation felt to be due to complication of anesthesia plus Hyponatremia requiring Precedex drip.    PT Comments    Patient received more alert and communicate with full sentence with decreased agitation and restlessness. This a progress for pt's cognitive status.Pt agreed to ambulate with PT.  Pt ambulated 40 ft with FWW with Mod assist of 2 with poor postural control. Pt HR elevated to 139 and SPO2 87-88% at room air. Pt appeared congested but could not do effective coughing. Pt nurse made aware of it. PT, Nurse and Pt wife spent time about PT recommendations of SNF considering pt and wife's safety.Pt's wife may spent the day with the pt to wean pt off of the  need for a sitter. Pt will benefit form continued PT interventions.   Follow Up Recommendations  SNF     Equipment Recommendations  Rolling walker with 5" wheels    Recommendations for Other Services       Precautions / Restrictions Precautions Precautions: Fall Precaution Comments: hand mitts    Mobility  Bed Mobility Overal bed mobility: Needs Assistance Bed Mobility: Supine to Sit     Supine to sit: Max assist     General bed mobility comments: cues for sequencing, initiation, safety    Transfers Overall transfer level: Needs assistance Equipment used: Rolling walker (2 wheeled) Transfers: Sit to/from Stand Sit to Stand: Max assist;+2 physical assistance            Ambulation/Gait Ambulation/Gait assistance: +2 physical assistance;Mod assist   Assistive device: Rolling walker (2 wheeled) Gait Pattern/deviations: Decreased stride length;Trunk flexed;Shuffle;Narrow base of support Gait velocity: decreased   General Gait Details:  Fatigue   Stairs             Wheelchair Mobility    Modified Rankin (Stroke Patients Only)       Balance Overall balance assessment: Needs assistance Sitting-balance support: Feet supported Sitting balance-Leahy Scale: Fair Sitting balance - Comments: CGA to Min assist Postural control: Other (comment) (forward lean) Standing balance support: Bilateral upper extremity supported Standing balance-Leahy Scale: Fair Standing balance comment: requires MOD A x2 to maintain                            Cognition Arousal/Alertness: Awake/alert Behavior During Therapy: Restless;Impulsive Overall Cognitive Status: Impaired/Different from baseline Area of Impairment: Orientation;Attention;Memory;Following commands;Problem solving;Safety/judgement;Awareness                 Orientation Level: Disoriented to;Person;Time;Place;Situation Current Attention Level: Alternating Memory: Decreased recall of precautions;Decreased short-term memory Following Commands: Follows one step commands inconsistently Safety/Judgement: Decreased awareness of safety;Decreased awareness of deficits   Problem Solving: Slow processing;Decreased initiation;Difficulty sequencing;Requires verbal cues        Exercises      General Comments General comments (skin integrity, edema, etc.): Pt showed increased inititation to participate requiring decreased assistance to perform activites.      Pertinent Vitals/Pain Pain Assessment: No/denies pain    Home Living                      Prior Function            PT Goals (current goals can now be found  in the care plan section) Progress towards PT goals: Progressing toward goals    Frequency    7X/week      PT Plan Current plan remains appropriate    Co-evaluation              AM-PAC PT "6 Clicks" Mobility   Outcome Measure  Help needed turning from your back to your side while in a flat bed without using  bedrails?: A Little Help needed moving from lying on your back to sitting on the side of a flat bed without using bedrails?: A Little Help needed moving to and from a bed to a chair (including a wheelchair)?: A Lot Help needed standing up from a chair using your arms (e.g., wheelchair or bedside chair)?: A Lot Help needed to walk in hospital room?: A Lot Help needed climbing 3-5 steps with a railing? : A Lot 6 Click Score: 14    End of Session Equipment Utilized During Treatment: Gait belt Activity Tolerance: Patient limited by fatigue Patient left: in chair;with call bell/phone within reach;with chair alarm set;with nursing/sitter in room;with family/visitor present Nurse Communication: Mobility status;Precautions PT Visit Diagnosis: Unsteadiness on feet (R26.81);Muscle weakness (generalized) (M62.81);Other abnormalities of gait and mobility (R26.89)     Time: [id number] PT Time Calculation (min) (ACUTE ONLY): 30 min  Charges:  $Gait Training: 8-22 mins $Therapeutic Activity: 8-22 mins                     Sherria Riemann PT DPT 1:43 PM,01/24/21

## 2021-01-24 NOTE — Progress Notes (Signed)
PROGRESS NOTE    [name]  [id number] DOB: 1946-04-15 DOA: 01/08/2021 PCP: Ileana Roup, MD  Assessment & Plan:   Principal Problem:   Hydrocephalus [facility]) Active Problems:   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 symptoms   Diabetes mellitus without complication ([place])   Hypertension   Acute metabolic encephalopathy   Leukocytosis   GERD (gastroesophageal reflux disease)   Nasogastric tube present   S/P VP shunt   Hydrocephalus: s/p of VP shunt placement. Management per neurosurgery   Acute metabolic encephalopathy: likely due to postsurgical delirium. Labile mental status & is agitated at night & intermittently throughout the day, will start seroquel 50 qhs. VPA is tapered off per neurology and stopped on 8/7. Was cleared for diet by speech therapy and Dobbhoff was removed.  Neuro was re-consulted, please see note for recs    Upper respiratory secretions and cough: continue chest PT, chest x-ray on 8/5 shows no acute abnormality. Suction prn. Aspiration precautions   BPH:  continue on home dose of flomax, proscar  DM2: fairly well controlled w/ HbA1c 6.8. Continue on glargine, SSI w/ accuchecks  HTN: continue on lisinopril, HCTZ  Leukocytosis: resolved    GERD: continue on PPI      DVT prophylaxis: heparin  Code Status: full  Family Communication: discussed pt's care w/ pt's family at bedside and answered their questions  Disposition Plan:  as per primary team   Level of care: Progressive Cardiac  Status is: Inpatient  Remains inpatient appropriate because:Inpatient level of care appropriate due to severity of illness  Dispo: The patient is from: Home              Anticipated d/c is to: SNF              Patient currently is not medically stable to d/c.   Difficult to place patient : unclear    Consultants:  Hospitalist   Procedures:   Antimicrobials:  Subjective: Pt is more awake today and able to answer questions but intermittently  agitated & confused  Objective: Vitals:   01/24/21 0500 01/24/21 0537 01/24/21 0559 01/24/21 0735  BP:  137/70  102/62  Pulse:  (!) 108 77 91  Resp:  18  17  Temp:  (!) 97.5 F (36.4 C) 98 F (36.7 C) 97.6 F (36.4 C)  TempSrc:   Axillary Axillary  SpO2:  96%    Weight: 75.5 kg     Height:        Intake/Output Summary (Last 24 hours) at 01/24/2021 0808 Last data filed at 01/23/2021 1820 Gross per 24 hour  Intake 720 ml  Output 675 ml  Net 45 ml   Filed Weights   01/21/21 0216 01/22/21 0359 01/24/21 0500  Weight: 75.7 kg 75.6 kg 75.5 kg    Examination:  General exam: Appears comfortable   Respiratory system: decreased breath sounds b/l  Cardiovascular system: S1/S2+. No rubs or clicks  Gastrointestinal system: Abd is soft, NT, ND & hypoactive bowel sounds Central nervous system: Awake and alert. Moves all extremities  Psychiatry: Judgement and insight appear abnormal. Flat mood and affect    Data Reviewed: I have personally reviewed following labs and imaging studies  CBC: Recent Labs  Lab 01/19/21 0444 01/23/21 0558  WBC 12.5* 10.0  HGB 14.8 15.1  HCT 42.0 42.8  MCV 95.2 98.6  PLT 236 99991111   Basic Metabolic Panel: Recent Labs  Lab 01/18/21 0443 01/19/21 0444 01/23/21 0558  NA 136 134* 136  K  4.0 4.1 4.4  CL 99 99 100  CO2 '29 28 27  '$ GLUCOSE 280* 307* 128*  BUN 24* 23 33*  CREATININE 0.76 0.84 0.93  CALCIUM 8.6* 8.5* 8.9  MG 2.4  --   --   PHOS 2.4*  --   --    GFR: Estimated Creatinine Clearance: 65.4 mL/min (by C-G formula based on SCr of 0.93 mg/dL). Liver Function Tests: No results for input(s): AST, ALT, ALKPHOS, BILITOT, PROT, ALBUMIN in the last 168 hours. No results for input(s): LIPASE, AMYLASE in the last 168 hours. No results for input(s): AMMONIA in the last 168 hours. Coagulation Profile: No results for input(s): INR, PROTIME in the last 168 hours. Cardiac Enzymes: No results for input(s): CKTOTAL, CKMB, CKMBINDEX, TROPONINI in  the last 168 hours. BNP (last 3 results) No results for input(s): PROBNP in the last 8760 hours. HbA1C: No results for input(s): HGBA1C in the last 72 hours. CBG: Recent Labs  Lab 01/22/21 1622 01/22/21 2054 26-Jan-2021 0719 Jan 26, 2021 1136 26-Jan-2021 1953  GLUCAP 87 263* 139* 150* 222*   Lipid Profile: No results for input(s): CHOL, HDL, LDLCALC, TRIG, CHOLHDL, LDLDIRECT in the last 72 hours. Thyroid Function Tests: No results for input(s): TSH, T4TOTAL, FREET4, T3FREE, THYROIDAB in the last 72 hours. Anemia Panel: No results for input(s): VITAMINB12, FOLATE, FERRITIN, TIBC, IRON, RETICCTPCT in the last 72 hours. Sepsis Labs: No results for input(s): PROCALCITON, LATICACIDVEN in the last 168 hours.   Recent Results (from the past 240 hour(s))  CULTURE, BLOOD (ROUTINE X 2) w Reflex to ID Panel     Status: None   Collection Time: 01/15/21  8:37 AM   Specimen: BLOOD LEFT HAND  Result Value Ref Range Status   Specimen Description BLOOD LEFT HAND  Final   Special Requests   Final    BOTTLES DRAWN AEROBIC AND ANAEROBIC Blood Culture adequate volume   Culture   Final    NO GROWTH 5 DAYS Performed at [facility], [address] [place], [place] [address]    Report Status 01/20/2021 FINAL  Final  CULTURE, BLOOD (ROUTINE X 2) w Reflex to ID Panel     Status: None   Collection Time: 01/15/21  8:38 AM   Specimen: BLOOD LEFT FOREARM  Result Value Ref Range Status   Specimen Description BLOOD LEFT FOREARM  Final   Special Requests   Final    BOTTLES DRAWN AEROBIC AND ANAEROBIC Blood Culture adequate volume   Culture   Final    NO GROWTH 5 DAYS Performed at [facility], [address] [place],  [address]    Report Status 01/20/2021 FINAL  Final         Radiology Studies: EEG adult  Result Date: Jan 26, 2021 Lora Havens, MD     01/26/21  5:06 PM Patient Name: Eric [name] MRN: [id number] Epilepsy Attending: Lora Havens Referring  Physician/Provider: Dr Kerney Elbe Date: 01-26-2021 Duration: 22.26 mins Patient history: 75 y.o male s/p right frontal VP shunt, continues to be encephalopathic. EEG to evaluate for seizure Level of alertness: Awake AEDs during EEG study: None Technical aspects: This EEG study was done with scalp electrodes positioned according to the 10-20 International system of electrode placement. Electrical activity was acquired at a sampling rate of '500Hz'$  and reviewed with a high frequency filter of '70Hz'$  and a low frequency filter of '1Hz'$ . EEG data were recorded continuously and digitally stored. Description: EEG showed continuous generalized 5 to 6 Hz theta as  well as intermittent 2-'3hz'$  high amplitude rhythmic delta slowing. Photic driving was not seen during this photic stimulation.Hyperventilation was not performed.   ABNORMALITY - Intermittent rhythmic delta slow, generalized ( GRDA) - Continuous slow, generalized IMPRESSION: This study is suggestive of moderate diffuse encephalopathy, nonspecific etiology. No seizures or epileptiform discharges were seen throughout the recording. Priyanka Barbra Sarks        Scheduled Meds:  Chlorhexidine Gluconate Cloth  6 each Topical Daily   feeding supplement (NEPRO CARB STEADY)  237 mL Oral TID BM   finasteride  5 mg Oral Nightly   furosemide  40 mg Intravenous Once   heparin injection (subcutaneous)  5,000 Units Subcutaneous Q8H   hydrochlorothiazide  25 mg Per Tube Daily   And   lisinopril  20 mg Per Tube Daily   insulin aspart  0-15 Units Subcutaneous TID WC   insulin aspart  0-5 Units Subcutaneous QHS   insulin aspart  3 Units Subcutaneous TID WC   insulin glargine-yfgn  30 Units Subcutaneous Daily   ipratropium-albuterol  3 mL Nebulization BID   mouth rinse  15 mL Mouth Rinse BID   melatonin  10 mg Per Tube QHS   multivitamin with minerals  1 tablet Oral Daily   pantoprazole  40 mg Oral Daily   senna  1 tablet Per Tube BID   tamsulosin  0.4 mg Oral QPC supper    thiamine  100 mg Oral Daily   vitamin B-12  1,000 mcg Per Tube Daily   Continuous Infusions:  sodium chloride Stopped (01/17/21 1344)     LOS: 16 days    Time spent: 30 mins    Wyvonnia Dusky, MD [facility] Pager [phone]  If 7PM-7AM, please contact night-coverage 01/24/2021, 8:08 AM

## 2021-01-24 NOTE — Progress Notes (Signed)
Occupational Therapy Treatment Patient Details Name: Eric [name] MRN: [id number] DOB: 1946/03/13 Today's Date: 01/24/2021    History of present illness Patient is a 75 y.o. male with Hydrocephalus status post elective Right Frontal VP Shunt insertion.  Post-op with acute delirium/agitation felt to be due to complication of anesthesia plus Hyponatremia requiring Precedex drip.   OT comments  Pt seen for OT tx this date to f/u re: safety with ADLs/ADL mobility. Pt's chair alarm starts sounding when OT is entering room and pt is noted to be sliding to edge of seat with sitter and spouse who are present in room attempting to help scoot patient back. OT lowers foot of recliner and pt is able to assist with scooting back with MOD verbal/tactile cues to sequence. Pt requires MAX A and MAX cues to adjust socks in sitting so that tread can help with fall prevention. Once repositioned, pt CTS with OT assist x2 trials with MAX multimodal Cues for safe hand placement with RW, pt ultimately unable to sequence pushing from arm rests of recliner to stand and OT holds walker down so that pt can pull to stand instead. Pt demos P standing balance with notable posterior lean. Pt c/o fatigue and wanting to get in the bed with somewhat slurred/garbled speech, but clear enough to discern his request. Pt does requires MAX A 2p assist to take ~4-5 small shuffle steps from the recliner to the bed with RW for UE Support with MOD verbal/tactile cues for safety/sequence and to manage RW. Pt returned to EOB Sitting. While in EOB sitting, requires MOD cues to extend posture and attempt to cough phlegm. Sip of water facilitated with MOD A to aide in loosening secretions. Pt unsuccessful in clearing secretions at this time, but does not appear uncomfortable. Pt returned to bed with bed alarm, HOB elevated, all needs met and in reach. Will continue to follow. Continue to anticipate that pt will require STR f/u OT services to improve  safety and strength for fall prevention and efficiency with ADLs.    Follow Up Recommendations  SNF    Equipment Recommendations  3 in 1 bedside commode;Tub/shower seat    Recommendations for Other Services      Precautions / Restrictions Precautions Precautions: Fall Precaution Comments: hand mitts Restrictions Weight Bearing Restrictions: No       Mobility Bed Mobility Overal bed mobility: Needs Assistance Bed Mobility: Sit to Supine     Supine to sit: Max assist Sit to supine: Min assist;Mod assist;HOB elevated   General bed mobility comments: MIN/MOD A to manage LEs back to bed.    Transfers Overall transfer level: Needs assistance Equipment used: Rolling walker (2 wheeled) Transfers: Sit to/from Stand Sit to Stand: Max assist         General transfer comment: MAX A +1 to CTS from chair, but +2 for steadying, equipment mgt, and safety with ~4-5 small shuffling steps from recliner to bed d/t heavy posterior lean    Balance Overall balance assessment: Needs assistance Sitting-balance support: Feet supported Sitting balance-Leahy Scale: Fair Sitting balance - Comments: CGA to Min assist Postural control: Posterior lean;Other (comment) (anterior lean in sitting occasionally wtih attempts to help pt offset heavy postioer lean) Standing balance support: Bilateral upper extremity supported Standing balance-Leahy Scale: Fair Standing balance comment: requires RW and MAX A to maintain static stand, at least MOD A +2 to sustain with fxl mobility  ADL either performed or assessed with clinical judgement   ADL Overall ADL's : Needs assistance/impaired                     Lower Body Dressing: Moderate assistance;Sitting/lateral leans Lower Body Dressing Details (indicate cue type and reason): to adjust socks, cues to attend to, initiate and sequence task. MOD A primarily required d/t difficulty sequencing task 2/2  confusion             Functional mobility during ADLs: Maximal assistance;+2 for safety/equipment;Rolling walker (to take 4-5 shuffling steps from recliner to EOB, 2nd person assistance required as pt with poor sequencing of task as well as heavy posterior lean)       Vision Patient Visual Report: No change from baseline     Perception     Praxis      Cognition Arousal/Alertness: Awake/alert Behavior During Therapy: Restless;Impulsive Overall Cognitive Status: Impaired/Different from baseline Area of Impairment: Orientation;Attention;Memory;Following commands;Problem solving;Safety/judgement;Awareness                 Orientation Level: Disoriented to;Time;Place;Situation Current Attention Level: Alternating Memory: Decreased recall of precautions;Decreased short-term memory Following Commands: Follows one step commands inconsistently Safety/Judgement: Decreased awareness of safety;Decreased awareness of deficits   Problem Solving: Slow processing;Decreased initiation;Difficulty sequencing;Requires verbal cues;Requires tactile cues General Comments: Pt able to follow ~60-70% of simple one step commands with increased processing time and multimodal cues (benefits more from visual/tactil than verbal)        Exercises Other Exercises Other Exercises: OT ed with pt and spouse re: stimulting/encouraging pt to communicate and move his limbs even while in bed to keep him from atrophy.   Shoulder Instructions       General Comments Pt showed increased inititation to participate requiring decreased assistance to perform activites.    Pertinent Vitals/ Pain       Pain Assessment: Faces Faces Pain Scale: No hurt  Home Living                                          Prior Functioning/Environment              Frequency  Min 2X/week        Progress Toward Goals  OT Goals(current goals can now be found in the care plan section)  Progress  towards OT goals: Progressing toward goals  Acute Rehab OT Goals Patient Stated Goal: none stated OT Goal Formulation: With patient/family Time For Goal Achievement: 01/31/21 Potential to Achieve Goals: Good  Plan Discharge plan remains appropriate;Frequency remains appropriate    Co-evaluation                 AM-PAC OT "6 Clicks" Daily Activity     Outcome Measure   Help from another person eating meals?: A Lot Help from another person taking care of personal grooming?: A Lot Help from another person toileting, which includes using toliet, bedpan, or urinal?: Total Help from another person bathing (including washing, rinsing, drying)?: Total Help from another person to put on and taking off regular upper body clothing?: A Lot Help from another person to put on and taking off regular lower body clothing?: A Lot 6 Click Score: 10    End of Session Equipment Utilized During Treatment: Rolling walker;Gait belt  OT Visit Diagnosis: Unsteadiness on feet (R26.81);Muscle weakness (generalized) (M62.81);Other symptoms and signs involving cognitive  function   Activity Tolerance Patient tolerated treatment well   Patient Left in bed;with call bell/phone within reach;with nursing/sitter in room;with family/visitor present;with restraints reapplied   Nurse Communication Mobility status        Time: 6378-5885 OT Time Calculation (min): 23 min  Charges: OT General Charges $OT Visit: 1 Visit OT Treatments $Self Care/Home Management : 8-22 mins $Therapeutic Activity: 8-22 mins  Gerrianne Scale, [place], OTR/L ascom [phone] 01/24/21, 4:10 PM

## 2021-01-24 NOTE — TOC Progression Note (Signed)
Transition of Care [facility]) - Progression Note    Patient Details  Name: Eric [name] MRN: [id number] Date of Birth: 1946/04/11  Transition of Care [facility]) CM/SW [place], LCSW Phone Number: 01/24/2021, 2:33 PM  Clinical Narrative: In progression rounds it was mentioned that pt's family would like to take pt home with home health. However, CSW spoke with pt's spouse and she states HH is just not going to be enough and she wants to continue with SNF placement. CSW explained the barriers of pt's behaviors and sitter--pt's spouse expressed understanding. Pt must be without sitter for 24 hrs prior to admit to SNF. MD and RN updated.      Expected Discharge Plan: [place] Barriers to Discharge: Continued Medical Work up  Expected Discharge Plan and Services Expected Discharge Plan: [place] In-house Referral: Clinical Social Work   Post Acute Care Choice: [place] Living arrangements for the past 2 months: Single Family Home                                       Social Determinants of Health (SDOH) Interventions    Readmission Risk Interventions No flowsheet data found.

## 2021-01-25 LAB — CBC
HCT: 40.3 % (ref 39.0–52.0)
Hemoglobin: 14.1 g/dL (ref 13.0–17.0)
MCH: 33.8 pg (ref 26.0–34.0)
MCHC: 35 g/dL (ref 30.0–36.0)
MCV: 96.6 fL (ref 80.0–100.0)
Platelets: 456 10*3/uL — ABNORMAL HIGH (ref 150–400)
RBC: 4.17 MIL/uL — ABNORMAL LOW (ref 4.22–5.81)
RDW: 12.1 % (ref 11.5–15.5)
WBC: 10.9 10*3/uL — ABNORMAL HIGH (ref 4.0–10.5)
nRBC: 0 % (ref 0.0–0.2)

## 2021-01-25 LAB — BASIC METABOLIC PANEL
Anion gap: 6 (ref 5–15)
BUN: 39 mg/dL — ABNORMAL HIGH (ref 8–23)
CO2: 28 mmol/L (ref 22–32)
Calcium: 9.2 mg/dL (ref 8.9–10.3)
Chloride: 100 mmol/L (ref 98–111)
Creatinine, Ser: 1.17 mg/dL (ref 0.61–1.24)
GFR, Estimated: >60 mL/min (ref >60)
Glucose, Bld: 86 mg/dL (ref 70–99)
Potassium: 4.5 mmol/L (ref 3.5–5.1)
Sodium: 134 mmol/L — ABNORMAL LOW (ref 135–145)

## 2021-01-25 LAB — GLUCOSE, CAPILLARY
Glucose-Capillary: 139 mg/dL — ABNORMAL HIGH (ref 70–99)
Glucose-Capillary: 235 mg/dL — ABNORMAL HIGH (ref 70–99)
Glucose-Capillary: 85 mg/dL (ref 70–99)
Glucose-Capillary: 184 mg/dL — ABNORMAL HIGH (ref 70–99)

## 2021-01-25 MED ORDER — QUETIAPINE FUMARATE 25 MG PO TABS
25.0000 mg | ORAL_TABLET | Freq: Every morning | ORAL | Status: DC
  Administered 2021-01-25 – 2021-01-31 (×7): 25 mg via ORAL
  Filled 2021-01-25 (×7): qty 1

## 2021-01-25 NOTE — Progress Notes (Signed)
PROGRESS NOTE    Eric [name]  [id number] DOB: 1945-10-10 DOA: 01/08/2021 PCP: Eric Roup, MD  Assessment & Plan:   Principal Problem:   Hydrocephalus [facility]) Active Problems:   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 symptoms   Diabetes mellitus without complication ([place])   Hypertension   Acute metabolic encephalopathy   Leukocytosis   GERD (gastroesophageal reflux disease)   Nasogastric tube present   S/P VP shunt   Hydrocephalus: s/p of VP shunt placement. Management per neurosurgery   Acute metabolic encephalopathy: likely due to postsurgical delirium vs dementia. Still agitated during the day and night. Continue on seroquel qhs and started on seroquel QAM. Continue w/ sitter but pt must be sitter free for 24 hours before pt can d/c to SNF. VPA is tapered off per neurology and stopped on 8/7. Was cleared for diet by speech therapy and Dobbhoff was removed.  Neuro was re-consulted, please see note for recs on 8/9    Upper respiratory secretions and cough: continue chest PT, chest x-ray on 8/5 shows no acute abnormality. Suction prn. Aspiration precautions   BPH:  continue on proscar, flomax   DM2: HbA1c 6.8, fairly well controlled. Continue on glargine, SSI w/ accuchecks   HTN: continue on lisinopril, HCTZ  Leukocytosis: labile    GERD: continue on PPI   Thrombocytosis: etiology unclear, likely reactive     DVT prophylaxis: heparin  Code Status: full  Family Communication:  Disposition Plan:  as per primary team   Level of care: Med-Surg  Status is: Inpatient  Remains inpatient appropriate because:Inpatient level of care appropriate due to severity of illness  Dispo: The patient is from: Home              Anticipated d/c is to: SNF              Patient currently is not medically stable to d/c.   Difficult to place patient : unclear    Consultants:  Hospitalist   Procedures:   Antimicrobials:  Subjective: Pt is still agitated and  confused.   Objective: Vitals:   01/24/21 1945 01/24/21 2302 01/25/21 0345 01/25/21 0727  BP:  (!) 92/56 126/70 121/70  Pulse:  92 94 94  Resp:  '18 17 17  '$ Temp:  98 F (36.7 C)  98 F (36.7 C)  TempSrc:  Oral    SpO2: 100% 94% 95% 99%  Weight:      Height:        Intake/Output Summary (Last 24 hours) at 01/25/2021 0744 Last data filed at 01/24/2021 1900 Gross per 24 hour  Intake 240 ml  Output --  Net 240 ml   Filed Weights   01/21/21 0216 01/22/21 0359 01/24/21 0500  Weight: 75.7 kg 75.6 kg 75.5 kg    Examination:  General exam: Appears restless & agitated  Respiratory system: diminished breath sounds b/l  Cardiovascular system: S1 & S2+. No rubs or clicks  Gastrointestinal system: Abd is soft, NT, ND & hypoactive bowel sounds  Central nervous system: Awake and alert. Moves all extremities  Psychiatry: Judgement and insight appear abnormal. Flat mood and affect    Data Reviewed: I have personally reviewed following labs and imaging studies  CBC: Recent Labs  Lab 01/19/21 0444 01/23/21 0558 01/25/21 0457  WBC 12.5* 10.0 10.9*  HGB 14.8 15.1 14.1  HCT 42.0 42.8 40.3  MCV 95.2 98.6 96.6  PLT 236 302 [id number]*   Basic Metabolic Panel: Recent Labs  Lab 01/19/21 0444 01/23/21 0558 01/25/21  0457  NA 134* 136 134*  K 4.1 4.4 4.5  CL 99 100 100  CO2 '28 27 28  '$ GLUCOSE 307* 128* 86  BUN 23 33* 39*  CREATININE 0.84 0.93 1.17  CALCIUM 8.5* 8.9 9.2   GFR: Estimated Creatinine Clearance: 52 mL/min (by C-G formula based on SCr of 1.17 mg/dL). Liver Function Tests: No results for input(s): AST, ALT, ALKPHOS, BILITOT, PROT, ALBUMIN in the last 168 hours. No results for input(s): LIPASE, AMYLASE in the last 168 hours. No results for input(s): AMMONIA in the last 168 hours. Coagulation Profile: No results for input(s): INR, PROTIME in the last 168 hours. Cardiac Enzymes: No results for input(s): CKTOTAL, CKMB, CKMBINDEX, TROPONINI in the last 168 hours. BNP (last  3 results) No results for input(s): PROBNP in the last 8760 hours. HbA1C: No results for input(s): HGBA1C in the last 72 hours. CBG: Recent Labs  Lab 01/24/21 0904 01/24/21 1118 01/24/21 1648 01/24/21 1941 01/25/21 0721  GLUCAP 165* 110* 112* 152* 139*   Lipid Profile: No results for input(s): CHOL, HDL, LDLCALC, TRIG, CHOLHDL, LDLDIRECT in the last 72 hours. Thyroid Function Tests: No results for input(s): TSH, T4TOTAL, FREET4, T3FREE, THYROIDAB in the last 72 hours. Anemia Panel: No results for input(s): VITAMINB12, FOLATE, FERRITIN, TIBC, IRON, RETICCTPCT in the last 72 hours. Sepsis Labs: No results for input(s): PROCALCITON, LATICACIDVEN in the last 168 hours.   Recent Results (from the past 240 hour(s))  CULTURE, BLOOD (ROUTINE X 2) w Reflex to ID Panel     Status: None   Collection Time: 01/15/21  8:37 AM   Specimen: BLOOD LEFT HAND  Result Value Ref Range Status   Specimen Description BLOOD LEFT HAND  Final   Special Requests   Final    BOTTLES DRAWN AEROBIC AND ANAEROBIC Blood Culture adequate volume   Culture   Final    NO GROWTH 5 DAYS Performed at [facility], [address] [place], [place] [address]    Report Status 01/20/2021 FINAL  Final  CULTURE, BLOOD (ROUTINE X 2) w Reflex to ID Panel     Status: None   Collection Time: 01/15/21  8:38 AM   Specimen: BLOOD LEFT FOREARM  Result Value Ref Range Status   Specimen Description BLOOD LEFT FOREARM  Final   Special Requests   Final    BOTTLES DRAWN AEROBIC AND ANAEROBIC Blood Culture adequate volume   Culture   Final    NO GROWTH 5 DAYS Performed at [facility], [address], [place],  [address]    Report Status 01/20/2021 FINAL  Final         Radiology Studies: EEG adult  Result Date: 02-20-2021 Lora Havens, MD     2021/02/20  5:06 PM Patient Name: Eric [name] MRN: [id number] Epilepsy Attending: Lora Havens Referring Physician/Provider: Dr Kerney Elbe Date:  February 20, 2021 Duration: 22.26 mins Patient history: 75 y.o male s/p right frontal VP shunt, continues to be encephalopathic. EEG to evaluate for seizure Level of alertness: Awake AEDs during EEG study: None Technical aspects: This EEG study was done with scalp electrodes positioned according to the 10-20 International system of electrode placement. Electrical activity was acquired at a sampling rate of '500Hz'$  and reviewed with a high frequency filter of '70Hz'$  and a low frequency filter of '1Hz'$ . EEG data were recorded continuously and digitally stored. Description: EEG showed continuous generalized 5 to 6 Hz theta as well as intermittent 2-'3hz'$  high amplitude rhythmic delta slowing. Photic  driving was not seen during this photic stimulation.Hyperventilation was not performed.   ABNORMALITY - Intermittent rhythmic delta slow, generalized ( GRDA) - Continuous slow, generalized IMPRESSION: This study is suggestive of moderate diffuse encephalopathy, nonspecific etiology. No seizures or epileptiform discharges were seen throughout the recording. Priyanka Barbra Sarks        Scheduled Meds:  Chlorhexidine Gluconate Cloth  6 each Topical Daily   feeding supplement (NEPRO CARB STEADY)  237 mL Oral TID BM   finasteride  5 mg Oral Nightly   furosemide  40 mg Intravenous Once   heparin injection (subcutaneous)  5,000 Units Subcutaneous Q8H   hydrochlorothiazide  25 mg Per Tube Daily   And   lisinopril  20 mg Per Tube Daily   insulin aspart  0-15 Units Subcutaneous TID WC   insulin aspart  0-5 Units Subcutaneous QHS   insulin aspart  3 Units Subcutaneous TID WC   insulin glargine-yfgn  30 Units Subcutaneous Daily   ipratropium-albuterol  3 mL Nebulization BID   mouth rinse  15 mL Mouth Rinse BID   melatonin  10 mg Per Tube QHS   multivitamin with minerals  1 tablet Oral Daily   pantoprazole  40 mg Oral Daily   QUEtiapine  50 mg Oral QHS   senna  1 tablet Per Tube BID   tamsulosin  0.4 mg Oral QPC supper   thiamine   100 mg Oral Daily   vitamin B-12  1,000 mcg Per Tube Daily   Continuous Infusions:  sodium chloride Stopped (01/17/21 1344)     LOS: 17 days    Time spent: 25 mins    Wyvonnia Dusky, MD [facility] Pager [phone]  If 7PM-7AM, please contact night-coverage 01/25/2021, 7:44 AM

## 2021-01-25 NOTE — Progress Notes (Signed)
   Progress Note  History: Eric [name] is POD#17 from right frontal VP shunt placement for hydrocephalus. He has dementia at baseline which was exacerbated by anesthesia from his recent surgery. He has suffered from an extended postoperative delirium which has been clearing.   Physical Exam: Vitals:   01/25/21 0345 01/25/21 0727  BP: 126/70 121/70  Pulse: 94 94  Resp: 17 17  Temp:  98 F (36.7 C)  SpO2: 95% 99%    Awake and undergoing breathing treatment.  Following simple 1 step commands  All 3 incisions are c/d/I. Abdomen soft  Data:  Recent Labs  Lab 01/19/21 0444 01/23/21 0558 01/25/21 0457  NA 134* 136 134*  K 4.1 4.4 4.5  CL 99 100 100  CO2 '28 27 28  '$ BUN 23 33* 39*  CREATININE 0.84 0.93 1.17  GLUCOSE 307* 128* 86  CALCIUM 8.5* 8.9 9.2    No results for input(s): AST, ALT, ALKPHOS in the last 168 hours.  Invalid input(s): TBILI    Recent Labs  Lab 01/19/21 0444 01/23/21 0558 01/25/21 0457  WBC 12.5* 10.0 10.9*  HGB 14.8 15.1 14.1  HCT 42.0 42.8 40.3  PLT 236 302 456*    No results for input(s): APTT, INR in the last 168 hours.       Other tests/results: Stunt series completed post-op without noted complication.   CT head 01/10/21 showed good placement of catheter without any hemorrhage  Assessment/Plan:  Eric [name]  is a 75 y.o male s/p right frontal VP shunt. He is improving from post-operative delirium.  - DVT prophylaxis (On Heparin 5,000 q8) - PTOT  - Appreciate medicine's assistance with management  - Dispo planning is underway  Cooper Render PA-C Neurosurgery

## 2021-01-25 NOTE — Progress Notes (Signed)
Physical Therapy Treatment Patient Details Name: Eric [name] MRN: [id number] DOB: 09-Oct-1945 Today's Date: 01/25/2021    History of Present Illness Patient is a 75 y.o. male with Hydrocephalus status post elective Right Frontal VP Shunt insertion.  Post-op with acute delirium/agitation felt to be due to complication of anesthesia plus Hyponatremia requiring Precedex drip.    PT Comments    Patient is alert but remains confused to time, place and person. Pt able to follow one step simple commands. Pt received sleeping n bed with mitten on. With min VC, pt awake and readily participated with therapy intervention. Spouse and sitter present through out the session. Pt able ambulate further distance as  with improve posture and stride as compared to yesterday but needed max VC for postural corrections. Post ambulation, pt demonstrated effective coughing. Pt needs attention for respiratory congestion compromising aerobic capacity. Nurse made aware of it. Pt will benefit form continued skilled PT interventions.   Follow Up Recommendations  SNF     Equipment Recommendations  Rolling walker with 5" wheels    Recommendations for Other Services       Precautions / Restrictions Precautions Precautions: Fall    Mobility  Bed Mobility Overal bed mobility: Needs Assistance Bed Mobility: Supine to Sit Rolling: Max assist   Supine to sit: Max assist          Transfers Overall transfer level: Needs assistance Equipment used: 2 person hand held assist Transfers: Sit to/from Stand Sit to Stand: +2 physical assistance            Ambulation/Gait Ambulation/Gait assistance: +2 physical assistance (mod assist) Gait Distance (Feet): 80 Feet Assistive device: Rolling walker (2 wheeled) Gait Pattern/deviations: Decreased stride length;Trunk flexed;Shuffle;Narrow base of support Gait velocity: decreased   General Gait Details: Fatigue, increased porductive cough   Stairs              Wheelchair Mobility    Modified Rankin (Stroke Patients Only)       Balance Overall balance assessment: Needs assistance Sitting-balance support: Feet supported Sitting balance-Leahy Scale: Good Sitting balance - Comments: sup to CGA   Standing balance support: Bilateral upper extremity supported Standing balance-Leahy Scale: Fair                              Cognition Arousal/Alertness: Awake/alert (confused but follows simple commands.)   Overall Cognitive Status: Impaired/Different from baseline Area of Impairment: Orientation;Attention;Memory;Following commands;Problem solving;Safety/judgement;Awareness                 Orientation Level: Disoriented to;Time;Place;Situation   Memory: Decreased recall of precautions;Decreased short-term memory Following Commands: Follows one step commands consistently Safety/Judgement: Decreased awareness of safety;Decreased awareness of deficits   Problem Solving: Slow processing;Decreased initiation;Difficulty sequencing;Requires verbal cues;Requires tactile cues General Comments: Follows simple commands 100% of the time .      Exercises      General Comments General comments (skin integrity, edema, etc.): Pt showed increased inititation to participate requiring decreased assistance to perform activites.      Pertinent Vitals/Pain Faces Pain Scale: No hurt    Home Living                      Prior Function            PT Goals (current goals can now be found in the care plan section) Acute Rehab PT Goals Potential to Achieve Goals: Fair Progress towards PT  goals: Progressing toward goals    Frequency    7X/week      PT Plan Current plan remains appropriate    Co-evaluation              AM-PAC PT "6 Clicks" Mobility   Outcome Measure                   End of Session Equipment Utilized During Treatment: Gait belt Activity Tolerance: Patient tolerated treatment  well;Patient limited by fatigue (chest congestion, mouth breather and coughing.) Patient left: in chair;with call bell/phone within reach;with chair alarm set;with nursing/sitter in room;with family/visitor present Nurse Communication: Mobility status;Precautions PT Visit Diagnosis: Unsteadiness on feet (R26.81);Muscle weakness (generalized) (M62.81);Other abnormalities of gait and mobility (R26.89)     Time: [id number] PT Time Calculation (min) (ACUTE ONLY): 28 min  Charges:  $Gait Training: 23-37 mins                    Joaquin Music PT DPT 2:22 PM,01/25/21

## 2021-01-26 LAB — GLUCOSE, CAPILLARY
Glucose-Capillary: 110 mg/dL — ABNORMAL HIGH (ref 70–99)
Glucose-Capillary: 179 mg/dL — ABNORMAL HIGH (ref 70–99)
Glucose-Capillary: 83 mg/dL (ref 70–99)
Glucose-Capillary: 85 mg/dL (ref 70–99)

## 2021-01-26 MED ORDER — QUETIAPINE FUMARATE 25 MG PO TABS
75.0000 mg | ORAL_TABLET | Freq: Every day | ORAL | Status: DC
  Administered 2021-01-26 – 2021-01-30 (×5): 75 mg via ORAL
  Filled 2021-01-26 (×5): qty 3

## 2021-01-26 MED ORDER — OXYMETAZOLINE HCL 0.05 % NA SOLN
1.0000 | Freq: Two times a day (BID) | NASAL | Status: AC
  Administered 2021-01-26 – 2021-01-28 (×5): 1 via NASAL
  Filled 2021-01-26 (×2): qty 15

## 2021-01-26 NOTE — Plan of Care (Signed)
  Problem: Safety: Goal: Non-violent Restraint(s) Outcome: Progressing   Problem: Education: Goal: Knowledge of General Education information will improve Description: Including pain rating scale, medication(s)/side effects and non-pharmacologic comfort measures Outcome: Not Progressing   Problem: Health Behavior/Discharge Planning: Goal: Ability to manage health-related needs will improve Outcome: Not Progressing   Problem: Clinical Measurements: Goal: Ability to maintain clinical measurements within normal limits will improve Outcome: Progressing Goal: Will remain free from infection Outcome: Progressing Goal: Diagnostic test results will improve Outcome: Progressing Goal: Respiratory complications will improve Outcome: Progressing Goal: Cardiovascular complication will be avoided Outcome: Progressing   Problem: Activity: Goal: Risk for activity intolerance will decrease Outcome: Progressing   Problem: Nutrition: Goal: Adequate nutrition will be maintained Outcome: Progressing   Problem: Coping: Goal: Level of anxiety will decrease Outcome: Progressing   Problem: Elimination: Goal: Will not experience complications related to urinary retention Outcome: Progressing   Problem: Pain Managment: Goal: General experience of comfort will improve Outcome: Progressing   Problem: Safety: Goal: Ability to remain free from injury will improve Outcome: Not Progressing   Problem: Skin Integrity: Goal: Risk for impaired skin integrity will decrease Outcome: Progressing

## 2021-01-26 NOTE — Progress Notes (Signed)
   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 He has dementia at baseline which was exacerbated by anesthesia from his recent surgery. He has suffered from an extended postoperative delirium which has been clearing.   Physical Exam: Vitals:   01/26/21 0456 01/26/21 0751  BP: 114/72 123/71  Pulse: 95 96  Resp: 16 20  Temp:  97.6 F (36.4 C)  SpO2: 96% 98%    Awake and undergoing breathing treatment. More coherent this morning. Following simple 1 step commands in all 4 extremities All 3 incisions healing well Abdomen soft  Data:  Recent Labs  Lab 01/23/21 0558 01/25/21 0457  NA 136 134*  K 4.4 4.5  CL 100 100  CO2 27 28  BUN 33* 39*  CREATININE 0.93 1.17  GLUCOSE 128* 86  CALCIUM 8.9 9.2    No results for input(s): AST, ALT, ALKPHOS in the last 168 hours.  Invalid input(s): TBILI    Recent Labs  Lab 01/23/21 0558 01/25/21 0457  WBC 10.0 10.9*  HGB 15.1 14.1  HCT 42.8 40.3  PLT 302 456*    No results for input(s): APTT, INR in the last 168 hours.       Other tests/results: Stunt series completed post-op without noted complication.   CT head 01/10/21 showed good placement of catheter without any hemorrhage  Assessment/Plan:  Eric [name]  is a 75 y.o male s/p right frontal VP shunt. He is improving from post-operative delirium.  - DVT prophylaxis (On Heparin 5,000 q8) - PTOT  - requested the nurse to provide activity blanket for the patient  - Appreciate medicine's assistance with management  - Dispo planning is underway  Cooper Render PA-C Neurosurgery

## 2021-01-26 NOTE — Progress Notes (Signed)
Occupational Therapy Treatment Patient Details Name: Eric [name] MRN: [id number] DOB: [birth year] Today's Date: 01/26/2021    History of present illness Patient is a 75 y.o. male with Hydrocephalus status post elective Right Frontal VP Shunt insertion.  Post-op with acute delirium/agitation felt to be due to complication of anesthesia plus Hyponatremia requiring Precedex drip.   OT comments  Pt seen for OT Tx this date to f/u re: safety with ADLs/ADL mobility. Pt with lower extremities to side of bed at start of session with sitter having just tried to assist fully back into bed. Pt assisted into sitting with HOB elevated and MIN/MOD A. He demos F sitting balance. Pt assisted to stand with MAX A and RW and his soiled brief and chuck were removed. Pt assisted for second stand and requires MAX A for peri care and donning clean brief, tolerates stand ~1-2 minutes. Pt returned to EOB sitting and participates in hand washing and oral care with SETUP to MIN A and cues to initiate and sequence including visual/tactile cues to re-direct d/t confusion. Pt returned to bed with all needs met and in reach. Pt's spouse and sitter present throughout. Will continue to follow. Continue to anticipate that pt will require STR in SNF setting given his current cognitive and functional status.   Follow Up Recommendations  SNF    Equipment Recommendations  3 in 1 bedside commode;Tub/shower seat    Recommendations for Other Services      Precautions / Restrictions Precautions Precautions: Fall Restrictions Weight Bearing Restrictions: No       Mobility Bed Mobility Overal bed mobility: Needs Assistance Bed Mobility: Supine to Sit;Sit to Supine     Supine to sit: Min assist;Mod assist;HOB elevated Sit to supine: Min assist;Mod assist;HOB elevated        Transfers Overall transfer level: Needs assistance Equipment used: 2 person hand held assist Transfers: Sit to/from Stand Sit to Stand: Max  assist;From elevated surface         General transfer comment: 2 stand trials, EOB elevated ~3-4" and pt able to CTS with RW with MAX A. Attempted to cue pt for safe sequencing and pt demonstarting very limited comprehension of hand placement/correct use of RW.    Balance Overall balance assessment: Needs assistance Sitting-balance support: Feet supported Sitting balance-Leahy Scale: Fair Sitting balance - Comments: F sitting balance, pt can static sit, but cannot tolerate a challenge   Standing balance support: Bilateral upper extremity supported Standing balance-Leahy Scale: Poor Standing balance comment: Pt requires MAX A for STS, gradually progresses to sustaining static stand with UE Support and MIN/MOD A.                           ADL either performed or assessed with clinical judgement   ADL Overall ADL's : Needs assistance/impaired     Grooming: Oral care;Set up;Min guard;Sitting Grooming Details (indicate cue type and reason): cues to initiate and sequence while sitting EOB for oral care with mouth swab x3. Pt also requires cues for safety as he is seen attempting to wipe the inside of his mouth with tissues.         Upper Body Dressing : Minimal assistance;Sitting Upper Body Dressing Details (indicate cue type and reason): to doff soiled gown and don PJ shirt to his top. Requires extended time and tactile/visual cues to be succesful with buttoning 4 buttons. Lower Body Dressing: Maximal assistance;Sit to/from stand Lower Body Dressing Details (indicate cue  type and reason): to doff soiled brief and don clean one     Toileting- Clothing Manipulation and Hygiene: Maximal assistance;Sit to/from stand Toileting - Clothing Manipulation Details (indicate cue type and reason): for posterior peri care in standing with RW For UE Support and cues to sequence/thorough completion d/t several voids in brief with wetness having spread to his lower back, mepilex removed and  sitter aware that pt will require a new one.             Vision   Additional Comments: difficult to formally assess d/t cognition   Perception     Praxis      Cognition Arousal/Alertness: Awake/alert Behavior During Therapy: Restless;Impulsive Overall Cognitive Status: Impaired/Different from baseline Area of Impairment: Orientation;Attention;Memory;Following commands;Problem solving;Safety/judgement;Awareness                 Orientation Level: Disoriented to;Time;Place;Situation Current Attention Level: Alternating Memory: Decreased recall of precautions;Decreased short-term memory Following Commands: Follows one step commands consistently Safety/Judgement: Decreased awareness of safety;Decreased awareness of deficits   Problem Solving: Slow processing;Decreased initiation;Difficulty sequencing;Requires verbal cues;Requires tactile cues General Comments: Follows most simple one step commands with increased processing time and multimodal cues.        Exercises Other Exercises Other Exercises: OT engages pt in seated g/h tasks and STS x2 trials with MAX A with RW.   Shoulder Instructions       General Comments      Pertinent Vitals/ Pain       Pain Assessment: Faces Faces Pain Scale: No hurt  Home Living                                          Prior Functioning/Environment              Frequency  Min 2X/week        Progress Toward Goals  OT Goals(current goals can now be found in the care plan section)  Progress towards OT goals: Progressing toward goals  Acute Rehab OT Goals Patient Stated Goal: none stated OT Goal Formulation: With patient/family Time For Goal Achievement: 01/31/21 Potential to Achieve Goals: Good  Plan Discharge plan remains appropriate;Frequency remains appropriate    Co-evaluation                 AM-PAC OT "6 Clicks" Daily Activity     Outcome Measure   Help from another person eating  meals?: A Lot Help from another person taking care of personal grooming?: A Lot Help from another person toileting, which includes using toliet, bedpan, or urinal?: Total Help from another person bathing (including washing, rinsing, drying)?: Total Help from another person to put on and taking off regular upper body clothing?: A Lot Help from another person to put on and taking off regular lower body clothing?: A Lot 6 Click Score: 10    End of Session Equipment Utilized During Treatment: Rolling walker;Gait belt  OT Visit Diagnosis: Unsteadiness on feet (R26.81);Muscle weakness (generalized) (M62.81);Other symptoms and signs involving cognitive function   Activity Tolerance Patient tolerated treatment well   Patient Left in bed;with call bell/phone within reach;with nursing/sitter in room;with family/visitor present;with bed alarm set   Nurse Communication Mobility status        Time: 5329-9242 OT Time Calculation (min): 27 min  Charges: OT General Charges $OT Visit: 1 Visit OT Treatments $Self Care/Home Management : 8-22 mins $  Therapeutic Activity: 8-22 mins  Gerrianne Scale, [place], OTR/L ascom [phone] 01/26/21, 1:41 PM

## 2021-01-26 NOTE — TOC Progression Note (Addendum)
Transition of Care [facility]) - Progression Note    Patient Details  Name: Eric [name] MRN: [id number] Date of Birth: 11-04-1945  Transition of [facility]) CM/SW [place], LCSW Phone Number: 01/26/2021, 9:27 AM  Clinical Narrative:   CSW called and left a VM for Ricky at [facility] asking if they would reconsider patient for SNF if his behaviors calm down and sitter is removed. Waiting on response.  10:30- Spoke to Huson at [facility]. He will talk with his Administrator and see if they can consider patient. He asked for updated notes. Sent notes in [facility]. He stated they cannot take patient if he still has an NG tube, according to notes patients NG tube was removed.   Expected Discharge Plan: [place] Barriers to Discharge: Continued Medical Work up  Expected Discharge Plan and Services Expected Discharge Plan: [place] In-house Referral: Clinical Social Work   Post Acute Care Choice: [place] Living arrangements for the past 2 months: Single Family Home                                       Social Determinants of Health (SDOH) Interventions    Readmission Risk Interventions No flowsheet data found.

## 2021-01-26 NOTE — Progress Notes (Signed)
PROGRESS NOTE    Eric [name]  [id number] DOB: 1945-06-24 DOA: 01/08/2021 PCP: Ileana Roup, MD  Assessment & Plan:   Principal Problem:   Hydrocephalus [facility]) Active Problems:   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 symptoms   Diabetes mellitus without complication ([place])   Hypertension   Acute metabolic encephalopathy   Leukocytosis   GERD (gastroesophageal reflux disease)   Nasogastric tube present   S/P VP shunt   Hydrocephalus: s/p of VP shunt placement. Management per neurosurgery   Acute metabolic encephalopathy: likely due to postsurgical delirium vs dementia. Still agitated throughout day & night. Increased qhs dose of seroquel to '75mg'$  and continue on seroquel QAM. Continue w/ sitter but pt must be sitter free for 24 hours before pt can d/c to SNF. VPA is tapered off per neurology and stopped on 8/7. Was cleared for diet by speech therapy and Dobbhoff was removed.  Neuro was re-consulted, please see note for recs on 8/9    Upper respiratory secretions and cough: continue chest PT, chest x-ray on 8/5 shows no acute abnormality. Suction prn. Aspiration precautions   BPH: continue on finasteride, tamsulosin   DM2: HbA1c 6.8, fairly well controlled. Continue on glargine, SSI w/ accuchecks   HTN: continue on HCTZ, lisinopril   Leukocytosis: labile    GERD: continue on pantoprazole   Thrombocytosis: etiology unclear, likely reactive      DVT prophylaxis: heparin  Code Status: full  Family Communication:  Disposition Plan:  as per primary team   Level of care: Med-Surg  Status is: Inpatient  Remains inpatient appropriate because:Inpatient level of care appropriate due to severity of illness  Dispo: The patient is from: Home              Anticipated d/c is to: SNF              Patient currently is not medically stable to d/c.   Difficult to place patient : unclear    Consultants:  Hospitalist   Procedures:    Antimicrobials:  Subjective: Pt is very agitated and still requiring a sitter  Objective: Vitals:   01/25/21 2101 01/26/21 0456 01/26/21 0514 01/26/21 0751  BP: 112/74 114/72  123/71  Pulse: (!) 102 95  96  Resp: '18 16  20  '$ Temp: 97.6 F (36.4 C)   97.6 F (36.4 C)  TempSrc: Oral   Oral  SpO2: 97% 96%  98%  Weight:   76.1 kg   Height:        Intake/Output Summary (Last 24 hours) at 01/26/2021 0809 Last data filed at 01/25/2021 2000 Gross per 24 hour  Intake 480 ml  Output 400 ml  Net 80 ml   Filed Weights   01/22/21 0359 01/24/21 0500 01/26/21 0514  Weight: 75.6 kg 75.5 kg 76.1 kg    Examination:  General exam: Appears restless  Respiratory system: decreased breath sounds b/l  Cardiovascular system: S1/S2+. No rubs or clicks Gastrointestinal system: Abd is soft, NT, ND & hypoactive bowel sounds  Central nervous system: Awake and alert. Moves all extremities  Psychiatry: Judgement and insight appear abnormal. Flat mood and affect    Data Reviewed: I have personally reviewed following labs and imaging studies  CBC: Recent Labs  Lab 01/23/21 0558 01/25/21 0457  WBC 10.0 10.9*  HGB 15.1 14.1  HCT 42.8 40.3  MCV 98.6 96.6  PLT 302 [id number]*   Basic Metabolic Panel: Recent Labs  Lab 01/23/21 0558 01/25/21 0457  NA 136 134*  K  4.4 4.5  CL 100 100  CO2 27 28  GLUCOSE 128* 86  BUN 33* 39*  CREATININE 0.93 1.17  CALCIUM 8.9 9.2   GFR: Estimated Creatinine Clearance: 52 mL/min (by C-G formula based on SCr of 1.17 mg/dL). Liver Function Tests: No results for input(s): AST, ALT, ALKPHOS, BILITOT, PROT, ALBUMIN in the last 168 hours. No results for input(s): LIPASE, AMYLASE in the last 168 hours. No results for input(s): AMMONIA in the last 168 hours. Coagulation Profile: No results for input(s): INR, PROTIME in the last 168 hours. Cardiac Enzymes: No results for input(s): CKTOTAL, CKMB, CKMBINDEX, TROPONINI in the last 168 hours. BNP (last 3  results) No results for input(s): PROBNP in the last 8760 hours. HbA1C: No results for input(s): HGBA1C in the last 72 hours. CBG: Recent Labs  Lab 01/25/21 0721 01/25/21 1148 01/25/21 1717 01/25/21 2049 01/26/21 0749  GLUCAP 139* 235* 85 184* 110*   Lipid Profile: No results for input(s): CHOL, HDL, LDLCALC, TRIG, CHOLHDL, LDLDIRECT in the last 72 hours. Thyroid Function Tests: No results for input(s): TSH, T4TOTAL, FREET4, T3FREE, THYROIDAB in the last 72 hours. Anemia Panel: No results for input(s): VITAMINB12, FOLATE, FERRITIN, TIBC, IRON, RETICCTPCT in the last 72 hours. Sepsis Labs: No results for input(s): PROCALCITON, LATICACIDVEN in the last 168 hours.   No results found for this or any previous visit (from the past 240 hour(s)).        Radiology Studies: No results found.      Scheduled Meds:  Chlorhexidine Gluconate Cloth  6 each Topical Daily   feeding supplement (NEPRO CARB STEADY)  237 mL Oral TID BM   finasteride  5 mg Oral Nightly   furosemide  40 mg Intravenous Once   heparin injection (subcutaneous)  5,000 Units Subcutaneous Q8H   hydrochlorothiazide  25 mg Per Tube Daily   And   lisinopril  20 mg Per Tube Daily   insulin aspart  0-15 Units Subcutaneous TID WC   insulin aspart  0-5 Units Subcutaneous QHS   insulin aspart  3 Units Subcutaneous TID WC   insulin glargine-yfgn  30 Units Subcutaneous Daily   ipratropium-albuterol  3 mL Nebulization BID   mouth rinse  15 mL Mouth Rinse BID   melatonin  10 mg Per Tube QHS   multivitamin with minerals  1 tablet Oral Daily   pantoprazole  40 mg Oral Daily   QUEtiapine  25 mg Oral q AM   QUEtiapine  50 mg Oral QHS   senna  1 tablet Per Tube BID   tamsulosin  0.4 mg Oral QPC supper   thiamine  100 mg Oral Daily   vitamin B-12  1,000 mcg Per Tube Daily   Continuous Infusions:  sodium chloride Stopped (01/17/21 1344)     LOS: 18 days    Time spent: 15 mins    Wyvonnia Dusky, MD [facility] Pager [phone]  If 7PM-7AM, please contact night-coverage 01/26/2021, 8:09 AM

## 2021-01-26 NOTE — Progress Notes (Addendum)
Physical Therapy Treatment Patient Details Name: Eric [name] MRN: [id number] DOB: 04-03-1946 Today's Date: 01/26/2021    History of Present Illness Patient is a 75 y.o. male with Hydrocephalus status post elective Right Frontal VP Shunt insertion.  Post-op with acute delirium/agitation felt to be due to complication of anesthesia plus Hyponatremia requiring Precedex drip.    PT Comments    Pt was supine in bed with sitter at bedside. Agrees to PT session and is cooperative throughout session however sitter/RN reports he does easily get agitated. When pt fully cooperates, is able to exit bed with min assist. Stand to RW with min-mod assist of one, and ambulate ~ 120 ft with chair follow. Pt was able to follow commands more consistently throughout. PT will continue to progress pt as able per POC. Highly recommend DC to SNF to address deficits while maximizing independence with ADLs.    Follow Up Recommendations  SNF     Equipment Recommendations  Rolling walker with 5" wheels       Precautions / Restrictions Precautions Precautions: Fall Precaution Comments: hand mitts Restrictions Weight Bearing Restrictions: No    Mobility  Bed Mobility Overal bed mobility: Needs Assistance Bed Mobility: Supine to Sit;Sit to Supine     Supine to sit: Min assist;HOB elevated Sit to supine: Min assist;HOB elevated   General bed mobility comments: Pt is was able to exit and re-eneter bed with min assist + vcs for safety and sequencing. pt is slightly impulsive but easily redirected. much more cooperative and less agitated once mitts were removed.    Transfers Overall transfer level: Needs assistance Equipment used: Rolling walker (2 wheeled) Transfers: Sit to/from Stand Sit to Stand: Min assist;Mod assist;From elevated surface         General transfer comment: pt demonstrated much improved abilities to stand from slightly elevated bed height. When pt is cooperative and focused on desired  task requested, he does well. When resistive he requires max/ +2 assistance    Balance Overall balance assessment: Needs assistance Sitting-balance support: Feet supported Sitting balance-Leahy Scale: Fair     Standing balance support: Bilateral upper extremity supported;During functional activity Standing balance-Leahy Scale: Fair Standing balance comment: need constant assistance throughout for safety. no LOB with BUE support      Cognition Arousal/Alertness: Awake/alert Behavior During Therapy: WFL for tasks assessed/performed;Restless Overall Cognitive Status: Impaired/Different from baseline      Following Commands: Follows one step commands consistently Safety/Judgement: Decreased awareness of safety;Decreased awareness of deficits   Problem Solving: Slow processing;Decreased initiation;Difficulty sequencing;Requires verbal cues;Requires tactile cues General Comments: Pt is alert but confused and disoriented      Exercises Other Exercises Other Exercises: OT engages pt in seated g/h tasks and STS x2 trials with MAX A with RW.        Pertinent Vitals/Pain Pain Assessment: No/denies pain Pain Score: 0-No pain Faces Pain Scale: No hurt Pain Location: c/o neck discomfort Pain Descriptors / Indicators: Discomfort Pain Intervention(s): Limited activity within patient's tolerance;Monitored during session;Repositioned;Premedicated before session     PT Goals (current goals can now be found in the care plan section) Acute Rehab PT Goals Patient Stated Goal: none stated Progress towards PT goals: Progressing toward goals    Frequency    7X/week      PT Plan Current plan remains appropriate    Co-evaluation PT/OT/SLP Co-Evaluation/Treatment: Yes   PT goals addressed during session: Mobility/safety with mobility;Balance;Proper use of DME;Strengthening/ROM        AM-PAC PT "6  Clicks" Mobility   Outcome Measure  Help needed turning from your back to your side  while in a flat bed without using bedrails?: A Little Help needed moving from lying on your back to sitting on the side of a flat bed without using bedrails?: A Little Help needed moving to and from a bed to a chair (including a wheelchair)?: A Little Help needed standing up from a chair using your arms (e.g., wheelchair or bedside chair)?: A Lot Help needed to walk in hospital room?: A Lot Help needed climbing 3-5 steps with a railing? : A Lot 6 Click Score: 15    End of Session Equipment Utilized During Treatment: Gait belt Activity Tolerance: Patient tolerated treatment well;Treatment limited secondary to agitation (gets agitated with sitter) Patient left: in chair;with call bell/phone within reach;with chair alarm set;with nursing/sitter in room;with family/visitor present Nurse Communication: Mobility status;Precautions PT Visit Diagnosis: Unsteadiness on feet (R26.81);Muscle weakness (generalized) (M62.81);Other abnormalities of gait and mobility (R26.89)     Time: 1450-1520 PT Time Calculation (min) (ACUTE ONLY): 30 min  Charges:  $Gait Training: 8-22 mins $Therapeutic Activity: 8-22 mins                     Julaine Fusi PTA 01/26/21, 4:41 PM

## 2021-01-27 LAB — GLUCOSE, CAPILLARY
Glucose-Capillary: 73 mg/dL (ref 70–99)
Glucose-Capillary: 157 mg/dL — ABNORMAL HIGH (ref 70–99)
Glucose-Capillary: 60 mg/dL — ABNORMAL LOW (ref 70–99)
Glucose-Capillary: 100 mg/dL — ABNORMAL HIGH (ref 70–99)

## 2021-01-27 MED ORDER — DEXTROSE 50 % IV SOLN
INTRAVENOUS | Status: AC
  Filled 2021-01-27: qty 50

## 2021-01-27 MED ORDER — DICLOFENAC SODIUM 25 MG PO TBEC
25.0000 mg | DELAYED_RELEASE_TABLET | Freq: Two times a day (BID) | ORAL | Status: DC
  Administered 2021-01-27 – 2021-01-31 (×8): 25 mg via ORAL
  Filled 2021-01-27 (×11): qty 1

## 2021-01-27 NOTE — Progress Notes (Signed)
Pt BS noted @ 100 after IV access obtained no further intervention needed at this time.

## 2021-01-27 NOTE — Progress Notes (Signed)
PT Cancellation Note  Patient Details Name: Eric [name] MRN: [id number] DOB: April 18, 1946   Cancelled Treatment:     PT attempt. Pt is alert and appropriately conversational this morning. Sitter and spouse at patients bedside. Pt did ambulate to BR earlier and reports feeling "too sore to walk right now." Author will return after lunch to see if pt is more willing to participate. Author elected not to push pt due to recent history of getting agitated.     Willette Pa 01/27/2021, 10:58 AM

## 2021-01-27 NOTE — Progress Notes (Signed)
PT Cancellation Note  Patient Details Name: Eric [name] MRN: [id number] DOB: 1945-09-09   Cancelled Treatment:     PT attempt several attempts made throughout the day. Pt just returned to bed from ambulation to BR with sitter. PT will return tomorrow and continue to follow per current POC.     Willette Pa 01/27/2021, 2:08 PM

## 2021-01-27 NOTE — Progress Notes (Signed)
Pt BS noted @ 60 asymptomatic, attempt was made to give IV Dextrose. Loss of IV access noted. Unable to obtain access and stat order was placed for IV team nurse. Pt drinking and eating at this time.

## 2021-01-27 NOTE — Progress Notes (Signed)
Neurosurgery Daily Note  Progress Note  History: Eric [name] is POD#19 from right frontal VP shunt placement for hydrocephalus. He has dementia at baseline which was exacerbated by anesthesia from his recent surgery. He has suffered from an extended postoperative delirium which has been clearing.   24 Hour events: Patient was up ambulating with walker and assistance.  He was able to go to the bathroom with a sitter.    Physical Exam: Vitals:   01/27/21 0429 01/27/21 0818  BP: 101/60 108/62  Pulse: 98 85  Resp: 17 18  Temp: 97.8 F (36.6 C) 97.8 F (36.6 C)  SpO2: 93% 100%    Awakens easily from bed, hypophonic but does answer questions All 3 incisions healing well Abdomen soft  Data:  Recent Labs  Lab 01/23/21 0558 01/25/21 0457  NA 136 134*  K 4.4 4.5  CL 100 100  CO2 27 28  BUN 33* 39*  CREATININE 0.93 1.17  GLUCOSE 128* 86  CALCIUM 8.9 9.2    No results for input(s): AST, ALT, ALKPHOS in the last 168 hours.  Invalid input(s): TBILI    Recent Labs  Lab 01/23/21 0558 01/25/21 0457  WBC 10.0 10.9*  HGB 15.1 14.1  HCT 42.8 40.3  PLT 302 456*    No results for input(s): APTT, INR in the last 168 hours.       Other tests/results: Shunt series completed post-op without noted complication.   CT head 01/10/21 showed good placement of catheter without any hemorrhage  Assessment/Plan:  Eric [name]  is a 75 y.o male s/p right frontal VP shunt. He is improving from post-operative delirium.  - DVT prophylaxis (On Heparin 5,000 q8) - PTOT when able - Appreciate medicine's assistance with management  - Dispo planning is underway  Deetta Perla, MD

## 2021-01-27 NOTE — Progress Notes (Signed)
PROGRESS NOTE    Eric [name]  [id number] DOB: 11/18/1945 DOA: 01/08/2021 PCP: Ileana Roup, MD  Assessment & Plan:   Principal Problem:   Hydrocephalus [facility]) Active Problems:   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 symptoms   Diabetes mellitus without complication ([place])   Hypertension   Acute metabolic encephalopathy   Leukocytosis   GERD (gastroesophageal reflux disease)   Nasogastric tube present   S/P VP shunt   Hydrocephalus: s/p of VP shunt placement. Management per neurosurgery   Acute metabolic encephalopathy: likely due to postsurgical delirium vs dementia. Improved agitation today. Continue on seroquel qam  & qhs. Hopefully can d/c sitter tomorrow. Pt must be sitter free for 24 hours before pt can d/c to SNF. VPA is tapered off per neurology and stopped on 8/7. Was cleared for diet by speech therapy and Dobbhoff was removed.  Neuro was re-consulted, please see note for recs on 8/9    Upper respiratory secretions and cough: continue chest PT, chest x-ray on 8/5 shows no acute abnormality. Suction prn. Aspiration precautions   BPH: continue on tamsulosin, finasteride   DM2: HbA1c 6.8, fairly well controlled. Continue on glargine, SSI w/ accuchecks   HTN: continue on lisinopril, HCTZ   Leukocytosis: labile    GERD: continue on PPI   Thrombocytosis: etiology unclear, likely reactive      DVT prophylaxis: heparin  Code Status: full  Family Communication:  Disposition Plan:  as per primary team   Level of care: Med-Surg  Status is: Inpatient  Remains inpatient appropriate because:Inpatient level of care appropriate due to severity of illness  Dispo: The patient is from: Home              Anticipated d/c is to: SNF              Patient currently is not medically stable to d/c.   Difficult to place patient : unclear    Consultants:  Hospitalist   Procedures:   Antimicrobials:  Subjective: Pt c/o fatigue.   Objective: Vitals:    01/26/21 0751 01/26/21 1628 01/26/21 2030 01/27/21 0429  BP: 123/71 (!) 134/115 111/68 101/60  Pulse: 96 93 (!) 103 98  Resp: '20 20 18 17  '$ Temp: 97.6 F (36.4 C) 98.5 F (36.9 C) 97.7 F (36.5 C) 97.8 F (36.6 C)  TempSrc: Oral  Oral Oral  SpO2: 98% 96% 100% 93%  Weight:    75.3 kg  Height:        Intake/Output Summary (Last 24 hours) at 01/27/2021 0805 Last data filed at 01/26/2021 0900 Gross per 24 hour  Intake 240 ml  Output --  Net 240 ml   Filed Weights   01/24/21 0500 01/26/21 0514 01/27/21 0429  Weight: 75.5 kg 76.1 kg 75.3 kg    Examination:  General exam: Appears calm & comfortable  Respiratory system: diminished breath sounds b/l Cardiovascular system: S1 & S2+. No rubs or clicks  Gastrointestinal system: Abd is soft, NT, ND & hypoactive bowel sounds  Central nervous system: Awake and alert. Moves all extremities  Psychiatry: Judgement and insight appear abnormal. Flat mood and affect    Data Reviewed: I have personally reviewed following labs and imaging studies  CBC: Recent Labs  Lab 01/23/21 0558 01/25/21 0457  WBC 10.0 10.9*  HGB 15.1 14.1  HCT 42.8 40.3  MCV 98.6 96.6  PLT 302 99991111*   Basic Metabolic Panel: Recent Labs  Lab 01/23/21 0558 01/25/21 0457  NA 136 134*  K 4.4 4.5  CL 100 100  CO2 27 28  GLUCOSE 128* 86  BUN 33* 39*  CREATININE 0.93 1.17  CALCIUM 8.9 9.2   GFR: Estimated Creatinine Clearance: 52 mL/min (by C-G formula based on SCr of 1.17 mg/dL). Liver Function Tests: No results for input(s): AST, ALT, ALKPHOS, BILITOT, PROT, ALBUMIN in the last 168 hours. No results for input(s): LIPASE, AMYLASE in the last 168 hours. No results for input(s): AMMONIA in the last 168 hours. Coagulation Profile: No results for input(s): INR, PROTIME in the last 168 hours. Cardiac Enzymes: No results for input(s): CKTOTAL, CKMB, CKMBINDEX, TROPONINI in the last 168 hours. BNP (last 3 results) No results for input(s): PROBNP in the last  8760 hours. HbA1C: No results for input(s): HGBA1C in the last 72 hours. CBG: Recent Labs  Lab 01/25/21 2049 01/26/21 0749 01/26/21 1329 01/26/21 1654 01/26/21 2159  GLUCAP 184* 110* 179* 83 85   Lipid Profile: No results for input(s): CHOL, HDL, LDLCALC, TRIG, CHOLHDL, LDLDIRECT in the last 72 hours. Thyroid Function Tests: No results for input(s): TSH, T4TOTAL, FREET4, T3FREE, THYROIDAB in the last 72 hours. Anemia Panel: No results for input(s): VITAMINB12, FOLATE, FERRITIN, TIBC, IRON, RETICCTPCT in the last 72 hours. Sepsis Labs: No results for input(s): PROCALCITON, LATICACIDVEN in the last 168 hours.   No results found for this or any previous visit (from the past 240 hour(s)).        Radiology Studies: No results found.      Scheduled Meds:  Chlorhexidine Gluconate Cloth  6 each Topical Daily   feeding supplement (NEPRO CARB STEADY)  237 mL Oral TID BM   finasteride  5 mg Oral Nightly   furosemide  40 mg Intravenous Once   heparin injection (subcutaneous)  5,000 Units Subcutaneous Q8H   hydrochlorothiazide  25 mg Per Tube Daily   And   lisinopril  20 mg Per Tube Daily   insulin aspart  0-15 Units Subcutaneous TID WC   insulin aspart  0-5 Units Subcutaneous QHS   insulin aspart  3 Units Subcutaneous TID WC   insulin glargine-yfgn  30 Units Subcutaneous Daily   mouth rinse  15 mL Mouth Rinse BID   melatonin  10 mg Per Tube QHS   multivitamin with minerals  1 tablet Oral Daily   oxymetazoline  1 spray Each Nare BID   pantoprazole  40 mg Oral Daily   QUEtiapine  25 mg Oral q AM   QUEtiapine  75 mg Oral QHS   senna  1 tablet Per Tube BID   tamsulosin  0.4 mg Oral QPC supper   thiamine  100 mg Oral Daily   vitamin B-12  1,000 mcg Per Tube Daily   Continuous Infusions:  sodium chloride Stopped (01/17/21 1344)     LOS: 19 days    Time spent: 15 mins    Wyvonnia Dusky, MD [facility] Pager [phone]  If 7PM-7AM, please contact  night-coverage 01/27/2021, 8:05 AM

## 2021-01-28 LAB — GLUCOSE, CAPILLARY
Glucose-Capillary: 51 mg/dL — ABNORMAL LOW (ref 70–99)
Glucose-Capillary: 70 mg/dL (ref 70–99)
Glucose-Capillary: 85 mg/dL (ref 70–99)
Glucose-Capillary: 152 mg/dL — ABNORMAL HIGH (ref 70–99)
Glucose-Capillary: 147 mg/dL — ABNORMAL HIGH (ref 70–99)
Glucose-Capillary: 119 mg/dL — ABNORMAL HIGH (ref 70–99)
Glucose-Capillary: 89 mg/dL (ref 70–99)

## 2021-01-28 MED ORDER — HALOPERIDOL LACTATE 5 MG/ML IJ SOLN
5.0000 mg | Freq: Four times a day (QID) | INTRAMUSCULAR | Status: DC | PRN
  Administered 2021-01-28: 5 mg via INTRAMUSCULAR
  Filled 2021-01-28: qty 1

## 2021-01-28 MED ORDER — ZIPRASIDONE MESYLATE 20 MG IM SOLR
10.0000 mg | Freq: Four times a day (QID) | INTRAMUSCULAR | Status: DC | PRN
  Administered 2021-01-28 – 2021-01-31 (×4): 10 mg via INTRAMUSCULAR
  Filled 2021-01-28 (×6): qty 20

## 2021-01-28 MED ORDER — SODIUM CHLORIDE 0.9 % IV BOLUS
500.0000 mL | Freq: Once | INTRAVENOUS | Status: AC
  Administered 2021-01-28: 500 mL via INTRAVENOUS

## 2021-01-28 NOTE — Progress Notes (Signed)
Neurosurgery Daily Note  Progress Note  History: Eric [name] is POD#20 from right frontal VP shunt placement for hydrocephalus. He has dementia at baseline which was exacerbated by anesthesia from his recent surgery. He has suffered from an extended postoperative delirium which has been clearing.   24 Hour events: Patient was up ambulating with walker and assistance to the bathroom. He is more awake today    Physical Exam: Vitals:   01/28/21 0817 01/28/21 0841  BP: (!) 94/56 113/61  Pulse: 80   Resp: 16   Temp: (!) 97.5 F (36.4 C)   SpO2: 99%     Awakens easily from bed, hypophonic but does answer questions appropriately Names 3/3 objects Follows commands throughout with good strength All 3 incisions healing well Abdomen soft  Data:  Recent Labs  Lab 01/23/21 0558 01/25/21 0457  NA 136 134*  K 4.4 4.5  CL 100 100  CO2 27 28  BUN 33* 39*  CREATININE 0.93 1.17  GLUCOSE 128* 86  CALCIUM 8.9 9.2    No results for input(s): AST, ALT, ALKPHOS in the last 168 hours.  Invalid input(s): TBILI    Recent Labs  Lab 01/23/21 0558 01/25/21 0457  WBC 10.0 10.9*  HGB 15.1 14.1  HCT 42.8 40.3  PLT 302 456*    No results for input(s): APTT, INR in the last 168 hours.       Other tests/results: Shunt series completed post-op without noted complication.   CT head 01/10/21 showed good placement of catheter without any hemorrhage  Assessment/Plan:  Eric [name]  is a 75 y.o male s/p right frontal VP shunt. He is improving from post-operative delirium.  - DVT prophylaxis (On Heparin 5,000 q8) - PTOT when able - Appreciate medicine's assistance with management  - Discuss with neurology any treatment for cognitive disorders - Will need to discuss long term plan with social work - Dispo planning is underway  Deetta Perla, MD

## 2021-01-28 NOTE — TOC Progression Note (Signed)
Transition of Care [facility]) - Progression Note    Patient Details  Name: Eric [name] MRN: [id number] Date of Birth: 26-Jul-1945  Transition of Care [facility]) CM/SW Palmer, LCSW Phone Number: 01/28/2021, 11:47 AM  Clinical Narrative:   Per rounds patient still has a sitter. Patient will need to be sitter free 24 hours before he can go to SNF. Patient also has no bed offers at this time. CSW extended the bed search. Updated RN and MD.    Expected Discharge Plan: Skilled Nursing Facility Barriers to Discharge: Continued Medical Work up  Expected Discharge Plan and Services Expected Discharge Plan: [place] In-house Referral: Clinical Social Work   Post Acute Care Choice: [place] Living arrangements for the past 2 months: Single Family Home                                       Social Determinants of Health (SDOH) Interventions    Readmission Risk Interventions No flowsheet data found.

## 2021-01-28 NOTE — Progress Notes (Addendum)
Physical Therapy Treatment Patient Details Name: Eric [name] MRN: [id number] DOB: Nov 04, 1945 Today's Date: 01/28/2021    History of Present Illness Patient is a 75 y.o. male with Hydrocephalus status post elective Right Frontal VP Shunt insertion.  Post-op with acute delirium/agitation felt to be due to complication of anesthesia plus Hyponatremia requiring Precedex drip.    PT Comments    Pt ready for session.  In good spirits.  To EOB with min a x 1.  Tech in to assist and reported he walked x 2 to bathroom with staff +2 assist and RW and overall poor gait that made them uncomfortable as walker was too far in front of him to be effective.  Trial of +2 HHA  100' with good tolerance and upright posture.  Cues to increase step length and height along with increasing BOS.  Tech stated improved comfort level with pt mobility after session.  Pt remained in recliner after session with alarm on and staff aware.   Follow Up Recommendations  SNF     Equipment Recommendations  Rolling walker with 5" wheels    Recommendations for Other Services       Precautions / Restrictions Precautions Precautions: Fall Restrictions Weight Bearing Restrictions: No    Mobility  Bed Mobility Overal bed mobility: Needs Assistance Bed Mobility: Supine to Sit     Supine to sit: Min assist          Transfers Overall transfer level: Needs assistance Equipment used: Rolling walker (2 wheeled) Transfers: Sit to/from Stand Sit to Stand: Min assist;+2 safety/equipment            Ambulation/Gait Ambulation/Gait assistance: Min assist;+2 physical assistance Gait Distance (Feet): 100 Feet Assistive device: 2 person hand held assist Gait Pattern/deviations: Decreased stride length;Trunk flexed;Shuffle;Narrow base of support Gait velocity: decreased   General Gait Details: staff reported poor gait to bathroom with RW.  HHA +2 used with tech and she reported significanly improved gait quality and  comfort.   Stairs             Wheelchair Mobility    Modified Rankin (Stroke Patients Only)       Balance Overall balance assessment: Needs assistance Sitting-balance support: Feet supported Sitting balance-Leahy Scale: Fair     Standing balance support: Bilateral upper extremity supported;During functional activity Standing balance-Leahy Scale: Fair Standing balance comment: need constant assistance throughout for safety. no LOB with BUE support                            Cognition Arousal/Alertness: Awake/alert Behavior During Therapy: WFL for tasks assessed/performed;Restless Overall Cognitive Status: Impaired/Different from baseline                                        Exercises      General Comments        Pertinent Vitals/Pain Pain Assessment: No/denies pain    Home Living                      Prior Function            PT Goals (current goals can now be found in the care plan section) Progress towards PT goals: Progressing toward goals    Frequency    7X/week      PT Plan Current plan remains appropriate  Co-evaluation              AM-PAC PT "6 Clicks" Mobility   Outcome Measure  Help needed turning from your back to your side while in a flat bed without using bedrails?: A Little Help needed moving from lying on your back to sitting on the side of a flat bed without using bedrails?: A Little Help needed moving to and from a bed to a chair (including a wheelchair)?: A Little Help needed standing up from a chair using your arms (e.g., wheelchair or bedside chair)?: A Lot Help needed to walk in hospital room?: A Lot Help needed climbing 3-5 steps with a railing? : A Lot 6 Click Score: 15    End of Session Equipment Utilized During Treatment: Gait belt Activity Tolerance: Patient tolerated treatment well;Treatment limited secondary to agitation (gets agitated with sitter) Patient left: in  chair;with call bell/phone within reach;with chair alarm set;with nursing/sitter in room;with family/visitor present Nurse Communication: Mobility status;Precautions PT Visit Diagnosis: Unsteadiness on feet (R26.81);Muscle weakness (generalized) (M62.81);Other abnormalities of gait and mobility (R26.89)     Time: [id number] PT Time Calculation (min) (ACUTE ONLY): 17 min  Charges:  $Gait Training: 8-22 mins                    Chesley Noon, PTA 01/28/21, 1:12 PM , 1:00 PM

## 2021-01-28 NOTE — Progress Notes (Signed)
PROGRESS NOTE    Eric [name]  [id number] DOB: 07-01-1945 DOA: 01/08/2021 PCP: Ileana Roup, MD  Assessment & Plan:   Principal Problem:   Hydrocephalus [facility]) Active Problems:   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 symptoms   Diabetes mellitus without complication ([place])   Hypertension   Acute metabolic encephalopathy   Leukocytosis   GERD (gastroesophageal reflux disease)   Nasogastric tube present   S/P VP shunt   Hydrocephalus: s/p of VP shunt placement. Management per neurosurgery   Acute metabolic encephalopathy: likely due to postsurgical delirium vs dementia. Much improved. Continue on seroquel qam  & qhs. D/c sitter today. Pt must be sitter free for 24 hours before pt can d/c to SNF. VPA is tapered off per neurology and stopped on 8/7. Was cleared for diet by speech therapy and Dobbhoff was removed.  Neuro was re-consulted, please see note for recs on 8/9    Upper respiratory secretions and cough: continue chest PT, chest x-ray on 8/5 shows no acute abnormality. Suction prn. Aspiration precautions   BPH: continue on finasteride, tamsulosin   DM2: fairly well controlled w/ HbA1c 6.8. Continue on glargine, SSI w/ accuchecks   HTN: continue on HCTZ, lisinopril   Leukocytosis: labile    GERD: continue on PPI   Thrombocytosis: etiology unclear, likely reactive     DVT prophylaxis: heparin  Code Status: full  Family Communication:  Disposition Plan:  as per primary team   Level of care: Med-Surg  Status is: Inpatient  Remains inpatient appropriate because:Inpatient level of care appropriate due to severity of illness  Dispo: The patient is from: Home              Anticipated d/c is to: SNF              Patient currently is not medically stable to d/c.   Difficult to place patient : unclear    Consultants:  Hospitalist   Procedures:   Antimicrobials:  Subjective: Pt denies any complaints    Objective: Vitals:   01/27/21 1815  01/27/21 2021 01/28/21 0655 01/28/21 0753  BP: (!) 127/98 110/60 (!) 147/98 (!) 78/60  Pulse: (!) 101 95 64 85  Resp:  '18 16 16  '$ Temp:  97.8 F (36.6 C) 98.7 F (37.1 C) (!) 97.5 F (36.4 C)  TempSrc:    Oral  SpO2:   100% 94%  Weight:      Height:        Intake/Output Summary (Last 24 hours) at 01/28/2021 0755 Last data filed at 01/27/2021 0925 Gross per 24 hour  Intake 120 ml  Output --  Net 120 ml   Filed Weights   01/24/21 0500 01/26/21 0514 01/27/21 0429  Weight: 75.5 kg 76.1 kg 75.3 kg    Examination:  General exam: Appears comfortable   Respiratory system: decreased breath sounds b/l  Cardiovascular system: S1/S2+. No rubs or clicks  Gastrointestinal system: Abd is soft, NT, ND & hypoactive bowel sounds  Central nervous system: Awake and alert. Moves all extremities  Psychiatry: Judgement and insight appear abnormal. Flat mood and affect    Data Reviewed: I have personally reviewed following labs and imaging studies  CBC: Recent Labs  Lab 01/23/21 0558 01/25/21 0457  WBC 10.0 10.9*  HGB 15.1 14.1  HCT 42.8 40.3  MCV 98.6 96.6  PLT 302 99991111*   Basic Metabolic Panel: Recent Labs  Lab 01/23/21 0558 01/25/21 0457  NA 136 134*  K 4.4 4.5  CL 100 100  CO2 27 28  GLUCOSE 128* 86  BUN 33* 39*  CREATININE 0.93 1.17  CALCIUM 8.9 9.2   GFR: Estimated Creatinine Clearance: 52 mL/min (by C-G formula based on SCr of 1.17 mg/dL). Liver Function Tests: No results for input(s): AST, ALT, ALKPHOS, BILITOT, PROT, ALBUMIN in the last 168 hours. No results for input(s): LIPASE, AMYLASE in the last 168 hours. No results for input(s): AMMONIA in the last 168 hours. Coagulation Profile: No results for input(s): INR, PROTIME in the last 168 hours. Cardiac Enzymes: No results for input(s): CKTOTAL, CKMB, CKMBINDEX, TROPONINI in the last 168 hours. BNP (last 3 results) No results for input(s): PROBNP in the last 8760 hours. HbA1C: No results for input(s): HGBA1C  in the last 72 hours. CBG: Recent Labs  Lab 01/26/21 2159 01/27/21 0813 01/27/21 1251 01/27/21 1654 01/27/21 1735  GLUCAP 85 73 157* 60* 100*   Lipid Profile: No results for input(s): CHOL, HDL, LDLCALC, TRIG, CHOLHDL, LDLDIRECT in the last 72 hours. Thyroid Function Tests: No results for input(s): TSH, T4TOTAL, FREET4, T3FREE, THYROIDAB in the last 72 hours. Anemia Panel: No results for input(s): VITAMINB12, FOLATE, FERRITIN, TIBC, IRON, RETICCTPCT in the last 72 hours. Sepsis Labs: No results for input(s): PROCALCITON, LATICACIDVEN in the last 168 hours.   No results found for this or any previous visit (from the past 240 hour(s)).        Radiology Studies: No results found.      Scheduled Meds:  Chlorhexidine Gluconate Cloth  6 each Topical Daily   diclofenac  25 mg Oral BID   feeding supplement (NEPRO CARB STEADY)  237 mL Oral TID BM   finasteride  5 mg Oral Nightly   furosemide  40 mg Intravenous Once   heparin injection (subcutaneous)  5,000 Units Subcutaneous Q8H   hydrochlorothiazide  25 mg Per Tube Daily   And   lisinopril  20 mg Per Tube Daily   insulin aspart  0-15 Units Subcutaneous TID WC   insulin aspart  0-5 Units Subcutaneous QHS   insulin aspart  3 Units Subcutaneous TID WC   insulin glargine-yfgn  30 Units Subcutaneous Daily   mouth rinse  15 mL Mouth Rinse BID   melatonin  10 mg Per Tube QHS   multivitamin with minerals  1 tablet Oral Daily   oxymetazoline  1 spray Each Nare BID   pantoprazole  40 mg Oral Daily   QUEtiapine  25 mg Oral q AM   QUEtiapine  75 mg Oral QHS   senna  1 tablet Per Tube BID   tamsulosin  0.4 mg Oral QPC supper   thiamine  100 mg Oral Daily   vitamin B-12  1,000 mcg Per Tube Daily   Continuous Infusions:  sodium chloride Stopped (01/17/21 1344)     LOS: 20 days    Time spent: 15 mins    Wyvonnia Dusky, MD [facility] Pager [phone]  If 7PM-7AM, please contact  night-coverage 01/28/2021, 7:55 AM

## 2021-01-29 LAB — GLUCOSE, CAPILLARY
Glucose-Capillary: 78 mg/dL (ref 70–99)
Glucose-Capillary: 164 mg/dL — ABNORMAL HIGH (ref 70–99)
Glucose-Capillary: 124 mg/dL — ABNORMAL HIGH (ref 70–99)
Glucose-Capillary: 138 mg/dL — ABNORMAL HIGH (ref 70–99)

## 2021-01-29 MED ORDER — GUAIFENESIN ER 600 MG PO TB12
1200.0000 mg | ORAL_TABLET | Freq: Two times a day (BID) | ORAL | Status: DC
  Administered 2021-01-29 – 2021-01-31 (×5): 1200 mg via ORAL
  Filled 2021-01-29 (×5): qty 2

## 2021-01-29 NOTE — Progress Notes (Signed)
Nutrition Follow Up Note   DOCUMENTATION CODES:   Not applicable  INTERVENTION:   Nepro Shake po TID, each supplement provides 425 kcal and 19 grams protein  Add Magic cup TID with meals, each supplement provides 290 kcal and 9 grams of protein  MVI po daily   Pt at high refeed risk; recommend monitor potassium, magnesium and phosphorus labs daily until stable  NUTRITION DIAGNOSIS:   Inadequate oral intake related to acute illness as evidenced by NPO status. - pt is advanced to a dysphagia 1 diet but continues to have poor oral intake   GOAL:   Patient will meet greater than or equal to 90% of their needs -progressing   MONITOR:   PO intake, Supplement acceptance, Labs, Weight trends, Skin, I & O's  ASSESSMENT:   75 y.o. male with PMH of hypertension, hyperlipidemia, diabetes mellitus, GERD, depression, anxiety, BPH and hydrocephalus who is admitted by neurosurgery for VP shunt placement 3/66 complicated by ongoing delerium  Met with pt in room today. Pt unable to provide any history. Pt documented to be eating sips/bites to 50% of meals in hospital. Pt will sometimes drink Nepro and sometimes refuses; pt did not drink his Nepro this morning. Per chart, pt is down 10lbs(6%) since admission. Pt was recommended for PEG tube but family declined as they felt pt would never want a feeding tube. RD will add Magic Cups to meal trays as hyperglycemia is improved. Recommend continue Nepro supplements; pt may also have Ensure if he prefers. Pt continues to be at refeed risk.   Medications reviewed and include: lasix, heparin, insulin, melatonin, MVI, protonix, thiamine  Labs reviewed: Na 134(L), BUN 39(H) P 2.4(L), Mg 2.4 wnl- 8/4 Wbc- 10.9(L) Cbgs- 78, 164 x 24 hrs  Diet Order:   Diet Order             DIET - DYS 1 Room service appropriate? Yes; Fluid consistency: Thin  Diet effective now                  EDUCATION NEEDS:   No education needs have been identified at  this time  Skin:  Skin Assessment: Reviewed RN Assessment (incision head, abdomen and neck)  Last BM:  8/13- type 4  Height:   Ht Readings from Last 1 Encounters:  01/16/21 5' 8" (1.727 m)    Weight:   Wt Readings from Last 1 Encounters:  01/27/21 75.3 kg    Ideal Body Weight:  70 kg  Estimated Nutritional Needs:   Kcal:  1900-2200kcal/day  Protein:  95-110g/day  Fluid:  1.8-2.1L/day  Koleen Distance MS, RD, LDN Please refer to [facility] for RD and/or RD on-call/weekend/after hours pager

## 2021-01-29 NOTE — Progress Notes (Signed)
Rehab Admissions Coordinator Note:  Patient was screened by Cleatrice Burke for appropriateness for an Inpatient Acute Rehab Consult for therapy have changed recommendations from SNF to CIR. Noted patient has not received any bed offers for SNF and wife has told TOC that she only wants local rehab venue. If wife is open to discussion for CIR rehab at [facility], then please place rehab consult order.  Hardie Pulley MSN 01/29/2021, 8:17 PM  I can be reached at [phone].

## 2021-01-29 NOTE — TOC Progression Note (Addendum)
Transition of Care [facility]) - Progression Note    Patient Details  Name: Eric [name] MRN: [id number] Date of Birth: [birth year]  Transition of Care [facility]) CM/SW Contact  Beverly Sessions, RN Phone Number: 01/29/2021, 3:13 PM  Clinical Narrative:     3 SNF bed offers (2 in [place], 1 in [place]) PT now recommending CIR  Called wife [name] to discuss recommendations.  She immediatly decline any offers staling she wanted something locally.  I let her know that due to patient's behaviors and diagnosis we may not receive a bed in [facility]  She request that I call [facility].  [facility] has declined and confirms that the DON said they will not be able to offer a bed.  Wife notified.  She states that she is not making a decision, and line was disconnected.  Attempted to call back, voicemail left   330 pm - per MD patient medically stable for discharge.  VM left for wife to update   Expected Discharge Plan: [place] Barriers to Discharge: Continued Medical Work up  Expected Discharge Plan and Services Expected Discharge Plan: [place] In-house Referral: Clinical Social Work   Post Acute Care Choice: [place] Living arrangements for the past 2 months: Single Family Home                                       Social Determinants of Health (SDOH) Interventions    Readmission Risk Interventions No flowsheet data found.

## 2021-01-29 NOTE — Progress Notes (Signed)
PROGRESS NOTE    Eric [name]  [id number] DOB: 10/23/1945 DOA: 01/08/2021 PCP: Ileana Roup, MD  Assessment & Plan:   Principal Problem:   Hydrocephalus [facility]) Active Problems:   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 symptoms   Diabetes mellitus without complication ([place])   Hypertension   Acute metabolic encephalopathy   Leukocytosis   GERD (gastroesophageal reflux disease)   Nasogastric tube present   S/P VP shunt   Hydrocephalus: s/p of VP shunt placement. Management per neurosurgery   Acute metabolic encephalopathy: likely due to postsurgical delirium vs dementia. Much improved. Continue on seroquel qam & qhs. D/c sitter on 01/28/21. Pt must be sitter free for 24 hours before pt can d/c to SNF. VPA is tapered off per neurology and stopped on 8/7. Was cleared for diet by speech therapy and Dobbhoff was removed.  Neuro was re-consulted, please see note for recs on 8/9    Upper respiratory secretions and cough: continue chest PT, chest x-ray on 8/5 shows no acute abnormality. Suction prn. Aspiration precautions   BPH: continue on tamsulosin, finasteride   DM2: fairly well controlled w/ HbA1c 6.8. Continue on glargine, SSI w/ accuchecks   HTN: continue on lisinopril, HCTZ  Leukocytosis: labile    GERD: continue on PPI   Thrombocytosis: etiology unclear, likely reactive     DVT prophylaxis: heparin  Code Status: full  Family Communication:  Disposition Plan:  as per primary team   Level of care: Med-Surg  Status is: Inpatient  Remains inpatient appropriate because:Inpatient level of care appropriate due to severity of illness  Dispo: The patient is from: Home              Anticipated d/c is to: SNF              Patient currently is medically stable for d/c   Difficult to place patient : unclear    Consultants:  Hospitalist   Procedures:   Antimicrobials:  Subjective: Pt denies any complaints this morning.   Objective: Vitals:   01/28/21  0817 01/28/21 0841 01/28/21 1539 01/29/21 0433  BP: (!) 94/56 113/61 (!) 100/58 (!) 107/95  Pulse: 80  90 92  Resp: '16  18 16  '$ Temp: (!) 97.5 F (36.4 C)  98.7 F (37.1 C) 98.9 F (37.2 C)  TempSrc: Oral     SpO2: 99%  100% 100%  Weight:      Height:       No intake or output data in the 24 hours ending 01/29/21 0746  Filed Weights   01/24/21 0500 01/26/21 0514 01/27/21 0429  Weight: 75.5 kg 76.1 kg 75.3 kg    Examination:  General exam: Appears calm & comfortable  Respiratory system: diminished breath sounds b/l  Cardiovascular system: S1 & S2+. No rubs or clicks  Gastrointestinal system: Abd is soft, NT , ND & hypoactive bowel sounds  Central nervous system: Awake and alert. Moves all extremities  Psychiatry: Judgement and insight appear abnormal. Flat mood and affect    Data Reviewed: I have personally reviewed following labs and imaging studies  CBC: Recent Labs  Lab 01/23/21 0558 01/25/21 0457  WBC 10.0 10.9*  HGB 15.1 14.1  HCT 42.8 40.3  MCV 98.6 96.6  PLT 302 [id number]*   Basic Metabolic Panel: Recent Labs  Lab 01/23/21 0558 01/25/21 0457  NA 136 134*  K 4.4 4.5  CL 100 100  CO2 27 28  GLUCOSE 128* 86  BUN 33* 39*  CREATININE 0.93 1.17  CALCIUM 8.9 9.2   GFR: Estimated Creatinine Clearance: 52 mL/min (by C-G formula based on SCr of 1.17 mg/dL). Liver Function Tests: No results for input(s): AST, ALT, ALKPHOS, BILITOT, PROT, ALBUMIN in the last 168 hours. No results for input(s): LIPASE, AMYLASE in the last 168 hours. No results for input(s): AMMONIA in the last 168 hours. Coagulation Profile: No results for input(s): INR, PROTIME in the last 168 hours. Cardiac Enzymes: No results for input(s): CKTOTAL, CKMB, CKMBINDEX, TROPONINI in the last 168 hours. BNP (last 3 results) No results for input(s): PROBNP in the last 8760 hours. HbA1C: No results for input(s): HGBA1C in the last 72 hours. CBG: Recent Labs  Lab 01/28/21 0843 01/28/21 1019  01/28/21 1140 01/28/21 1659 01/28/21 2217  GLUCAP 85 152* 147* 119* 89   Lipid Profile: No results for input(s): CHOL, HDL, LDLCALC, TRIG, CHOLHDL, LDLDIRECT in the last 72 hours. Thyroid Function Tests: No results for input(s): TSH, T4TOTAL, FREET4, T3FREE, THYROIDAB in the last 72 hours. Anemia Panel: No results for input(s): VITAMINB12, FOLATE, FERRITIN, TIBC, IRON, RETICCTPCT in the last 72 hours. Sepsis Labs: No results for input(s): PROCALCITON, LATICACIDVEN in the last 168 hours.   No results found for this or any previous visit (from the past 240 hour(s)).        Radiology Studies: No results found.      Scheduled Meds:  Chlorhexidine Gluconate Cloth  6 each Topical Daily   diclofenac  25 mg Oral BID   feeding supplement (NEPRO CARB STEADY)  237 mL Oral TID BM   finasteride  5 mg Oral Nightly   furosemide  40 mg Intravenous Once   heparin injection (subcutaneous)  5,000 Units Subcutaneous Q8H   hydrochlorothiazide  25 mg Per Tube Daily   And   lisinopril  20 mg Per Tube Daily   insulin aspart  0-15 Units Subcutaneous TID WC   insulin aspart  0-5 Units Subcutaneous QHS   insulin aspart  3 Units Subcutaneous TID WC   insulin glargine-yfgn  30 Units Subcutaneous Daily   mouth rinse  15 mL Mouth Rinse BID   melatonin  10 mg Per Tube QHS   multivitamin with minerals  1 tablet Oral Daily   oxymetazoline  1 spray Each Nare BID   pantoprazole  40 mg Oral Daily   QUEtiapine  25 mg Oral q AM   QUEtiapine  75 mg Oral QHS   senna  1 tablet Per Tube BID   tamsulosin  0.4 mg Oral QPC supper   thiamine  100 mg Oral Daily   vitamin B-12  1,000 mcg Per Tube Daily   Continuous Infusions:  sodium chloride Stopped (01/17/21 1344)     LOS: 21 days    Time spent: 15 mins    Wyvonnia Dusky, MD [facility] Pager [phone]  If 7PM-7AM, please contact night-coverage 01/29/2021, 7:46 AM

## 2021-01-29 NOTE — Progress Notes (Signed)
Neurosurgery Daily Note  Progress Note  History: Eric [name] is POD#21 from right frontal VP shunt placement for hydrocephalus. He has dementia at baseline which was exacerbated by anesthesia from his recent surgery. He has suffered from an extended postoperative delirium which has been clearing.   Physical Exam: Vitals:   01/28/21 1539 01/29/21 0433  BP: (!) 100/58 (!) 107/95  Pulse: 90 92  Resp: 18 16  Temp: 98.7 F (37.1 C) 98.9 F (37.2 C)  SpO2: 100% 100%    Awake laying in bed. Oriented to self.  Follows commands throughout with good strength All 3 incisions healing well Abdomen soft  Data:  Recent Labs  Lab 01/23/21 0558 01/25/21 0457  NA 136 134*  K 4.4 4.5  CL 100 100  CO2 27 28  BUN 33* 39*  CREATININE 0.93 1.17  GLUCOSE 128* 86  CALCIUM 8.9 9.2    No results for input(s): AST, ALT, ALKPHOS in the last 168 hours.  Invalid input(s): TBILI    Recent Labs  Lab 01/23/21 0558 01/25/21 0457  WBC 10.0 10.9*  HGB 15.1 14.1  HCT 42.8 40.3  PLT 302 456*    No results for input(s): APTT, INR in the last 168 hours.       Other tests/results: Shunt series completed post-op without noted complication.   CT head 01/10/21 showed good placement of catheter without any hemorrhage  Assessment/Plan:  Eric [name]  is a 75 y.o male s/p right frontal VP shunt. He is improving from post-operative delirium.  - DVT prophylaxis (On Heparin 5,000 q8) - PTOT when able - Appreciate medicine's assistance with management  - Discuss with neurology any treatment for cognitive disorders - Will need to discuss long term plan with social work - Dispo planning is underway  [employer] PA-C Neurosurgery

## 2021-01-29 NOTE — Evaluation (Signed)
Occupational Therapy Re-Evaluation Patient Details Name: Eric [name] MRN: [id number] DOB: [birth year] Today's Date: 01/29/2021    History of Present Illness Patient is a 75 y.o. male with Hydrocephalus status post elective Right Frontal VP Shunt insertion.  Post-op with acute delirium/agitation felt to be due to complication of anesthesia plus Hyponatremia requiring Precedex drip.   Clinical Impression   Eric [name] was seen for OT re-evaluation this date. Pt continues to demonstrate decreased ability to safely complete ADL management/daily care tasks at his baseline level of independence. However, he has made significant improvements with cognition, functional independence, and ADL management while acutely hospitalized. He currently requires MIN A +2 for functional mobility, MOD A to perform LB ADL management from STS at EOB, MIN A for UB ADL management in a seated position, as well as consistent cueing for safety and sequencing t/o session. See ADL section below for additional detail regarding occupational performance. Pt continues to demonstrate decreased safety awareness, decreased awareness of deficits, and generalized weakness. He would benefit from acute inpatient rehabilitative services upon hospital DC. POC updated to reflect current functional abilities. Pt continues to benefit from skilled OT services. Recommend CIR upon hospital DC.     Follow Up Recommendations  CIR    Equipment Recommendations  3 in 1 bedside commode;Tub/shower seat    Recommendations for Other Services       Precautions / Restrictions Precautions Precautions: Fall Restrictions Weight Bearing Restrictions: No      Mobility Bed Mobility Overal bed mobility: Needs Assistance Bed Mobility: Supine to Sit Rolling: Max assist   Supine to sit: Min assist Sit to supine: Min assist;HOB elevated   General bed mobility comments: tactile cues to initiate and min assist to get fully upright     Transfers Overall transfer level: Needs assistance Equipment used: Rolling walker (2 wheeled);None Transfers: Sit to/from Stand Sit to Stand: Mod assist;From elevated surface         General transfer comment: HHA +2 to stand from bed, RW with min a x 2    Balance Overall balance assessment: Needs assistance Sitting-balance support: Feet supported Sitting balance-Leahy Scale: Fair   Postural control: Posterior lean;Other (comment) Standing balance support: Bilateral upper extremity supported;During functional activity Standing balance-Leahy Scale: Fair Standing balance comment: need constant assistance throughout for safety. no LOB with BUE support                           ADL either performed or assessed with clinical judgement   ADL Overall ADL's : Needs assistance/impaired     Grooming: Sitting;Wash/dry face;Wash/dry hands;Min guard;Set up;Supervision/safety Grooming Details (indicate cue type and reason): cues to initiate and sequence while sitting EOB for face washing and hand hygine. Upper Body Bathing: Sitting;Minimal assistance;Cueing for sequencing;Cueing for safety   Lower Body Bathing: Sit to/from stand;Moderate assistance;Cueing for sequencing;Cueing for safety   Upper Body Dressing : Minimal assistance;Sitting;Cueing for safety;Cueing for sequencing Upper Body Dressing Details (indicate cue type and reason): Pt able to don button-down shirt while seated EOB. Requires increased time to perform and MIN A for fastening buttons. Lower Body Dressing: Sit to/from stand;Moderate assistance;Cueing for sequencing;Cueing for safety Lower Body Dressing Details (indicate cue type and reason): MOD A to don underwear/pants from sit to stand at EOB.   Toilet Transfer Details (indicate cue type and reason): MAX A to hold urinal for bed-level toileting.           General ADL Comments: Pt consistently  requires cues for safety as repeatedly attempts to stand from  EOB holding onto counter     Vision Patient Visual Report: No change from baseline Additional Comments: assessment limited 2/2 cognition.     Perception     Praxis      Pertinent Vitals/Pain Pain Assessment: Faces Faces Pain Scale: No hurt Pain Intervention(s): Monitored during session     Hand Dominance Right   Extremity/Trunk Assessment Upper Extremity Assessment Upper Extremity Assessment: Generalized weakness (R UE with more edema, UE ROM fxl to perform self care tasks. Some decreased grip strength, grossly 4-/5)   Lower Extremity Assessment Lower Extremity Assessment: Generalized weakness;Defer to PT evaluation   Cervical / Trunk Assessment Cervical / Trunk Assessment: Other exceptions Cervical / Trunk Exceptions: forward head/shoulders.   Communication Communication Communication: No difficulties   Cognition Arousal/Alertness: Awake/alert Behavior During Therapy: Restless;Impulsive;WFL for tasks assessed/performed Overall Cognitive Status: Impaired/Different from baseline Area of Impairment: Following commands;Safety/judgement;Awareness;Orientation                 Orientation Level: Disoriented to;Time;Situation Current Attention Level: Alternating Memory: Decreased recall of precautions;Decreased short-term memory Following Commands: Follows one step commands with increased time Safety/Judgement: Decreased awareness of safety;Decreased awareness of deficits   Problem Solving: Slow processing;Decreased initiation;Difficulty sequencing;Requires verbal cues;Requires tactile cues General Comments: Pt mildly impulsive t/o session. Redirectable to task at hand with multimodal cueing.   General Comments       Exercises Other Exercises Other Exercises: OT facilitates pt in seated/sit to stand wash-up of LB/UB, LB/UB dressing, bed motiliby, and toileting (as described in ADL section above) with safety education and falls prevention education provided t/o  session.   Shoulder Instructions      Home Living Family/patient expects to be discharged to:: Inpatient rehab Living Arrangements: Spouse/significant other Available Help at Discharge: Family;Available 24 hours/day Type of Home: House Home Access: Stairs to enter [employer] of Steps: 4-5 Entrance Stairs-Rails: Right;Left       Bathroom Shower/Tub: Walk-in shower         Home Equipment: Kasandra Knudsen - single point          Prior Functioning/Environment Level of Independence: Independent with assistive device(s)        Comments: PRN use of cane with ambulation, shuffled gait. independent with mobility otherwise with ADLs, drvies        OT Problem List: Decreased strength;Decreased activity tolerance;Impaired balance (sitting and/or standing);Decreased coordination;Decreased cognition;Decreased safety awareness;Decreased knowledge of precautions;Cardiopulmonary status limiting activity;Increased edema      OT Treatment/Interventions: Self-care/ADL training;DME and/or AE instruction;Therapeutic activities;Balance training;Therapeutic exercise;Energy conservation;Patient/family education    OT Goals(Current goals can be found in the care plan section) Acute Rehab OT Goals Patient Stated Goal: "To go outside" OT Goal Formulation: With patient/family Time For Goal Achievement: 01/31/21 Potential to Achieve Goals: Good ADL Goals Pt Will Perform Toileting - Clothing Manipulation and hygiene: with supervision;sit to/from stand  OT Frequency: Min 2X/week   Barriers to D/C:            Co-evaluation              AM-PAC OT "6 Clicks" Daily Activity     Outcome Measure Help from another person eating meals?: A Little Help from another person taking care of personal grooming?: A Little Help from another person toileting, which includes using toliet, bedpan, or urinal?: A Lot Help from another person bathing (including washing, rinsing, drying)?: A Lot Help from  another person to put on and taking off regular  upper body clothing?: A Little Help from another person to put on and taking off regular lower body clothing?: A Lot 6 Click Score: 15   End of Session Equipment Utilized During Treatment: Rolling walker;Gait belt Nurse Communication: Mobility status  Activity Tolerance: Patient tolerated treatment well Patient left: in bed;with call bell/phone within reach;with nursing/sitter in room;with bed alarm set  OT Visit Diagnosis: Unsteadiness on feet (R26.81);Muscle weakness (generalized) (M62.81);Other symptoms and signs involving cognitive function                Time: [id number] OT Time Calculation (min): 39 min Charges:  OT General Charges $OT Visit: 1 Visit OT Evaluation $OT Re-eval: 1 Re-eval OT Treatments $Self Care/Home Management : 38-52 mins  Shara Blazing, M.S., OTR/L Ascom: [phone] 01/29/21, 4:13 PM

## 2021-01-29 NOTE — Progress Notes (Signed)
Physical Therapy Treatment Patient Details Name: Eric [name] MRN: [id number] DOB: 1946-03-26 Today's Date: 01/29/2021    History of Present Illness Patient is a 75 y.o. male with Hydrocephalus status post elective Right Frontal VP Shunt insertion.  Post-op with acute delirium/agitation felt to be due to complication of anesthesia plus Hyponatremia requiring Precedex drip.    PT Comments    Pt ready for session.  To EOB with min a x 1 and tactile cues to initiate movement.  Steady in sitting.  HHA +2 to stand with min a x2.  He takes several step to door then reaches for walker.  It is given to him and he is able to walk unit with 1 seated rest break.  RW is raised which does improve gait quality.  He continues to need verbal and tactile cues for walker position, increasing step width and length and overall safety.  He is engaged during session today.  He answers questions when asked (although responses are mumbled and difficult to understand during gait and with mask). He does correct gait when cues today and pulls walker back to proper position when cued.  Laughs appropriately today during session.  Wife remains very attentive and involved in care.  Pt remains without clear discharge plan.  Pt continues to progress well with therapy being more engaged and tolerance of session improving.  CIR consult is recommended to see if he would be accepted into program.  Pt may need to discharge home with wife and she will need him to be at his max potential for a successful discharge.  If CIR is not available SNF remains recommended.   Follow Up Recommendations  CIR     Equipment Recommendations  Rolling walker with 5" wheels    Recommendations for Other Services       Precautions / Restrictions Precautions Precautions: Fall Restrictions Weight Bearing Restrictions: No    Mobility  Bed Mobility Overal bed mobility: Needs Assistance Bed Mobility: Supine to Sit     Supine to sit: Min  assist     General bed mobility comments: tactile cues to initiate and min assist to get fully upright    Transfers Overall transfer level: Needs assistance Equipment used: Rolling walker (2 wheeled);None Transfers: Sit to/from Stand Sit to Stand: Min assist;+2 safety/equipment         General transfer comment: HHA +2 to stand from bed, RW with min a x 2  Ambulation/Gait Ambulation/Gait assistance: Min assist;+2 physical assistance Gait Distance (Feet): 100 Feet Assistive device: Rolling walker (2 wheeled) Gait Pattern/deviations: Decreased stride length;Trunk flexed;Shuffle;Narrow base of support Gait velocity: decreased   General Gait Details: initially HHA +2 but pt reaches for walker so it was used.   Stairs             Wheelchair Mobility    Modified Rankin (Stroke Patients Only)       Balance Overall balance assessment: Needs assistance Sitting-balance support: Feet supported Sitting balance-Leahy Scale: Fair     Standing balance support: Bilateral upper extremity supported;During functional activity Standing balance-Leahy Scale: Fair Standing balance comment: need constant assistance throughout for safety. no LOB with BUE support                            Cognition Arousal/Alertness: Awake/alert Behavior During Therapy: WFL for tasks assessed/performed Overall Cognitive Status: Impaired/Different from baseline  Exercises      General Comments        Pertinent Vitals/Pain Pain Assessment: No/denies pain    Home Living                      Prior Function            PT Goals (current goals can now be found in the care plan section) Progress towards PT goals: Progressing toward goals    Frequency    7X/week      PT Plan Current plan remains appropriate    Co-evaluation              AM-PAC PT "6 Clicks" Mobility   Outcome Measure  Help needed  turning from your back to your side while in a flat bed without using bedrails?: A Little Help needed moving from lying on your back to sitting on the side of a flat bed without using bedrails?: A Little Help needed moving to and from a bed to a chair (including a wheelchair)?: A Little Help needed standing up from a chair using your arms (e.g., wheelchair or bedside chair)?: A Lot Help needed to walk in hospital room?: A Lot Help needed climbing 3-5 steps with a railing? : A Lot 6 Click Score: 15    End of Session Equipment Utilized During Treatment: Gait belt Activity Tolerance: Patient tolerated treatment well;Treatment limited secondary to agitation (gets agitated with sitter) Patient left: in chair;with call bell/phone within reach;with chair alarm set;with nursing/sitter in room;with family/visitor present Nurse Communication: Mobility status;Precautions PT Visit Diagnosis: Unsteadiness on feet (R26.81);Muscle weakness (generalized) (M62.81);Other abnormalities of gait and mobility (R26.89)     Time: [id number] PT Time Calculation (min) (ACUTE ONLY): 17 min  Charges:  $Gait Training: 8-22 mins                    Chesley Noon, PTA 01/29/21, 12:39 PM , 12:28 PM

## 2021-01-30 LAB — GLUCOSE, CAPILLARY
Glucose-Capillary: 89 mg/dL (ref 70–99)
Glucose-Capillary: 134 mg/dL — ABNORMAL HIGH (ref 70–99)
Glucose-Capillary: 169 mg/dL — ABNORMAL HIGH (ref 70–99)
Glucose-Capillary: 51 mg/dL — ABNORMAL LOW (ref 70–99)
Glucose-Capillary: 55 mg/dL — ABNORMAL LOW (ref 70–99)
Glucose-Capillary: 74 mg/dL (ref 70–99)
Glucose-Capillary: 156 mg/dL — ABNORMAL HIGH (ref 70–99)

## 2021-01-30 NOTE — Progress Notes (Signed)
PROGRESS NOTE   HPI was taken from Dr. Lacinda Axon: 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 He did have a fall a few months ago and his wife does feel like his symptoms increased after this. He currently is having a shuffling type gait and has had more difficulty with balance issues. He was fairly active last year and has been unable to do similar activities this year. He is also dealing with some confusion and memory problems that has been worsening. He denies any headaches or vision changes. 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 There is concern for some increased hydrocephalus. Shunt was discussed with them and they would like to proceed  Hospital course from Dr. Jimmye Norman 8/9-8/16/22 : Pt's mental status waxes and wanes since I had the pt. Pt initially had sitter for pt trying climb out of bed, agitation, delirium vs dementia. I started the pt on seroquel, currently on '25mg'$  QAM and '75mg'$  QHS. Pt's sitter was d/c on 01/28/21. PT/OT initially evaluated the pt and recommended SNF and yesterday PT/OT recommend CIR. CM found 3 SNF bed offers, 2 in [place] and 1 in [place] but pt's wife refused them all. Pt's wife is now appealing d/c as per CM. Pt is medically stable to be d/c as per primary team (neuro surg).    Eric [name]  [id number] DOB: 05/21/1946 DOA: 01/08/2021 PCP: Ileana Roup, MD  Assessment & Plan:   Principal Problem:   Hydrocephalus [facility]) Active Problems:   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 symptoms   Diabetes mellitus without complication ([place])   Hypertension   Acute metabolic encephalopathy   Leukocytosis   GERD (gastroesophageal reflux disease)   Nasogastric tube present   S/P VP shunt   Hydrocephalus: s/p of VP shunt placement. Management per neurosurgery   Acute metabolic encephalopathy: likely due to  postsurgical delirium vs dementia. Much improved. Continue on seroquel qam & qhs. D/c sitter on 01/28/21. Pt has been sitter free for over 24 hours. VPA is tapered off per neurology and stopped on 8/7. Was cleared for diet by speech therapy and Dobbhoff was removed.  Neuro was re-consulted, please see note for recs on 8/9    Upper respiratory secretions and cough: continue chest PT, chest x-ray on 8/5 shows no acute abnormality. Suction prn. Aspiration precautions   BPH: continue on finasteride, tamsulosin   DM2: fairly well controlled w/ HbA1c 6.8. Continue on glargine, SSI w/ accuchecks   HTN: continue on HCTZ, lisinopril   Leukocytosis: labile    GERD: continue on PPI   Thrombocytosis: etiology unclear, likely reactive      DVT prophylaxis: heparin  Code Status: full  Family Communication:  Disposition Plan:  as per primary team   Level of care: Med-Surg  Status is: Inpatient  Remains inpatient appropriate because:Inpatient level of care appropriate due to severity of illness  Dispo: The patient is from: Home              Anticipated d/c is to: SNF              Patient currently is medically stable for d/c   Difficult to place patient : yes     Consultants:  Hospitalist   Procedures:   Antimicrobials:  Subjective: Pt c/o denies any complaints  Objective: Vitals:   01/29/21 0800 01/29/21 1637  01/29/21 2015 01/30/21 0500  BP: 103/71 111/61 107/60   Pulse: 88 87 65   Resp: '16 15 20   '$ Temp: (!) 97.5 F (36.4 C) (!) 97.5 F (36.4 C) 98.8 F (37.1 C)   TempSrc: Oral Oral Oral   SpO2: 100% 99% 96%   Weight:    77.5 kg  Height:        Intake/Output Summary (Last 24 hours) at 01/30/2021 0730 Last data filed at 01/30/2021 0108 Gross per 24 hour  Intake 720 ml  Output 475 ml  Net 245 ml    Filed Weights   01/26/21 0514 01/27/21 0429 01/30/21 0500  Weight: 76.1 kg 75.3 kg 77.5 kg    Examination:  General exam: Appears comfortable  Respiratory system:  decreased breath sounds b/l Cardiovascular system: S1/ S2+. No rubs or clicks  Gastrointestinal system: Abd is soft, NT, ND & normal bowel sounds  Central nervous system: Awake and alert. Moves all extremities Psychiatry: Judgement and insight appear abnormal. Flat mood and affect    Data Reviewed: I have personally reviewed following labs and imaging studies  CBC: Recent Labs  Lab 01/25/21 0457  WBC 10.9*  HGB 14.1  HCT 40.3  MCV 96.6  PLT 99991111*   Basic Metabolic Panel: Recent Labs  Lab 01/25/21 0457  NA 134*  K 4.5  CL 100  CO2 28  GLUCOSE 86  BUN 39*  CREATININE 1.17  CALCIUM 9.2   GFR: Estimated Creatinine Clearance: 52 mL/min (by C-G formula based on SCr of 1.17 mg/dL). Liver Function Tests: No results for input(s): AST, ALT, ALKPHOS, BILITOT, PROT, ALBUMIN in the last 168 hours. No results for input(s): LIPASE, AMYLASE in the last 168 hours. No results for input(s): AMMONIA in the last 168 hours. Coagulation Profile: No results for input(s): INR, PROTIME in the last 168 hours. Cardiac Enzymes: No results for input(s): CKTOTAL, CKMB, CKMBINDEX, TROPONINI in the last 168 hours. BNP (last 3 results) No results for input(s): PROBNP in the last 8760 hours. HbA1C: No results for input(s): HGBA1C in the last 72 hours. CBG: Recent Labs  Lab 01/28/21 2217 01/29/21 0835 01/29/21 1128 01/29/21 1622 01/29/21 2009  GLUCAP 89 78 164* 124* 138*   Lipid Profile: No results for input(s): CHOL, HDL, LDLCALC, TRIG, CHOLHDL, LDLDIRECT in the last 72 hours. Thyroid Function Tests: No results for input(s): TSH, T4TOTAL, FREET4, T3FREE, THYROIDAB in the last 72 hours. Anemia Panel: No results for input(s): VITAMINB12, FOLATE, FERRITIN, TIBC, IRON, RETICCTPCT in the last 72 hours. Sepsis Labs: No results for input(s): PROCALCITON, LATICACIDVEN in the last 168 hours.   No results found for this or any previous visit (from the past 240 hour(s)).        Radiology  Studies: No results found.      Scheduled Meds:  Chlorhexidine Gluconate Cloth  6 each Topical Daily   diclofenac  25 mg Oral BID   feeding supplement (NEPRO CARB STEADY)  237 mL Oral TID BM   finasteride  5 mg Oral Nightly   furosemide  40 mg Intravenous Once   guaiFENesin  1,200 mg Oral BID   heparin injection (subcutaneous)  5,000 Units Subcutaneous Q8H   hydrochlorothiazide  25 mg Per Tube Daily   And   lisinopril  20 mg Per Tube Daily   insulin aspart  0-15 Units Subcutaneous TID WC   insulin aspart  0-5 Units Subcutaneous QHS   insulin aspart  3 Units Subcutaneous TID WC   insulin glargine-yfgn  30  Units Subcutaneous Daily   mouth rinse  15 mL Mouth Rinse BID   melatonin  10 mg Per Tube QHS   multivitamin with minerals  1 tablet Oral Daily   pantoprazole  40 mg Oral Daily   QUEtiapine  25 mg Oral q AM   QUEtiapine  75 mg Oral QHS   senna  1 tablet Per Tube BID   tamsulosin  0.4 mg Oral QPC supper   thiamine  100 mg Oral Daily   vitamin B-12  1,000 mcg Per Tube Daily   Continuous Infusions:  sodium chloride Stopped (01/17/21 1344)     LOS: 22 days    Time spent: 15 mins    Wyvonnia Dusky, MD [facility] Pager [phone]  If 7PM-7AM, please contact night-coverage 01/30/2021, 7:30 AM

## 2021-01-30 NOTE — Progress Notes (Signed)
Physical Therapy Treatment Patient Details Name: Eric [name] MRN: [id number] DOB: 02-17-1946 Today's Date: 01/30/2021    History of Present Illness Patient is a 75 y.o. male with Hydrocephalus status post elective Right Frontal VP Shunt insertion.  Post-op with acute delirium/agitation felt to be due to complication of anesthesia plus Hyponatremia requiring Precedex drip.    PT Comments    Pt was long sitting in bed, lethargic, upon arriving. He does awake and is cooperative however continues to present with cognition deficits effecting session/progression. Spouse at bedside and present throughout. Lengthy education (spouse) on what to expect  at DC. Pt's spouse/pt currently refusing to go to rehab at DC. They are planning to DC directly home with [facility] services. Chief Strategy Officer and OT voiced concerns with this plan however continued to refuse rehab at DC. If pt plans to go home, will need 24/7 assistance + equipment listed below. Acute PT will continue to follow throughout remainder of hospitalization while following current POC.    Follow Up Recommendations  CIR;Other (comment);Supervision/Assistance - 24 hour;Supervision for mobility/OOB (CIR is recommendation however spouse planning to take pt home at DC. MD/CM/ therapy staff all aware. will need 24/7 assistance/ supervision at DC)     Equipment Recommendations  Rolling walker with 5" wheels;Wheelchair (measurements PT);Hospital bed;Wheelchair cushion (measurements PT);3in1 (PT)       Precautions / Restrictions Precautions Precautions: Fall Restrictions Weight Bearing Restrictions: No    Mobility  Bed Mobility Overal bed mobility: Needs Assistance Bed Mobility: Supine to Sit     Supine to sit: HOB elevated;Mod assist     General bed mobility comments: pt required mod assist to exit R side of bed with max vcs and some tactle cues for progression and technique improvements. educated spouse throughotu session on assistance she will have to  perform at home. UNsure if she truely has full scope of what will be required at DC    Transfers Overall transfer level: Needs assistance Equipment used: Rolling walker (2 wheeled);None Transfers: Sit to/from Stand Sit to Stand: Mod assist;+2 safety/equipment        Lateral/Scoot Transfers: Mod assist;+2 physical assistance;Min assist General transfer comment: +2 assistance for safety with incraesed time and max vcs for handplacement and technique. pt is impulsive and will need 24/7 assistance with any/all mobility/transfers/ambulation  Ambulation/Gait Ambulation/Gait assistance: +2 safety/equipment;Min assist;Mod assist Gait Distance (Feet): 100 Feet Assistive device: Rolling walker (2 wheeled) Gait Pattern/deviations: Decreased stride length;Trunk flexed;Shuffle;Narrow base of support Gait velocity: decreased   General Gait Details: pt ambulated 100 ft with extremely poor flexed posture. Constant assistance/ correction in posture however pt unable to sustain. Will need 24/7 assistance at DC due to cognition and safety    Balance Overall balance assessment: Needs assistance Sitting-balance support: Feet supported Sitting balance-Leahy Scale: Fair   Postural control: Posterior lean Standing balance support: Bilateral upper extremity supported;During functional activity Standing balance-Leahy Scale: Poor Standing balance comment: need constant assistance throughout for safety. no LOB with BUE support        Cognition Arousal/Alertness: Lethargic;Suspect due to medications Behavior During Therapy: [facility] for tasks assessed/performed (Pt's cognition is poor. unable to correct posture follow commands consistently.) Overall Cognitive Status: Impaired/Different from baseline      Memory: Decreased recall of precautions;Decreased short-term memory Following Commands: Follows one step commands inconsistently Safety/Judgement: Decreased awareness of safety;Decreased awareness of  deficits   Problem Solving: Slow processing;Decreased initiation;Difficulty sequencing;Requires verbal cues;Requires tactile cues General Comments: Pt mildly impulsive t/o session. Redirectable to task at hand with  multimodal cueing.      Exercises Other Exercises Other Exercises: OT/PT facilitate functional mobility in room/hallway this dat. OT facilitates seated grooming tasks once back to room and up in recliner. Increased time taken to educate pt wife (at bedside t/o session) on DC recommendations, equipment for home, strategies to support both her own and pt safety upon DC as wife is planning to take pt home upon acute care DC.        Pertinent Vitals/Pain Pain Assessment: No/denies pain Pain Score: 0-No pain Faces Pain Scale: No hurt     PT Goals (current goals can now be found in the care plan section) Acute Rehab PT Goals Patient Stated Goal: To go home Progress towards PT goals: Not progressing toward goals - comment (cognition continues to slow recovery)    Frequency    7X/week      PT Plan Current plan remains appropriate    Co-evaluation PT/OT/SLP Co-Evaluation/Treatment: Yes Reason for Co-Treatment: Complexity of the patient's impairments (multi-system involvement);Necessary to address cognition/behavior during functional activity;For patient/therapist safety;To address functional/ADL transfers PT goals addressed during session: Mobility/safety with mobility;Strengthening/ROM;Proper use of DME;Balance;Other (comment) (family education/equipment recommendations) OT goals addressed during session: ADL's and self-care;Proper use of [employer] equipment and DME      AM-PAC PT "6 Clicks" Mobility   Outcome Measure  Help needed turning from your back to your side while in a flat bed without using bedrails?: A Little Help needed moving from lying on your back to sitting on the side of a flat bed without using bedrails?: A Lot Help needed moving to and from a bed to a  chair (including a wheelchair)?: A Lot Help needed standing up from a chair using your arms (e.g., wheelchair or bedside chair)?: A Lot Help needed to walk in hospital room?: A Lot Help needed climbing 3-5 steps with a railing? : A Lot 6 Click Score: 13    End of Session Equipment Utilized During Treatment: Gait belt Activity Tolerance: Patient tolerated treatment well;Other (comment);Patient limited by fatigue (limited by cognition and fatigue) Patient left: in chair;with call bell/phone within reach;with chair alarm set;with nursing/sitter in room;with family/visitor present Nurse Communication: Mobility status;Precautions PT Visit Diagnosis: Unsteadiness on feet (R26.81);Muscle weakness (generalized) (M62.81);Other abnormalities of gait and mobility (R26.89)     Time: [id number] PT Time Calculation (min) (ACUTE ONLY): 32 min  Charges:  $Gait Training: 8-22 mins $Therapeutic Activity: 8-22 mins                     Julaine Fusi PTA 01/30/21, 3:35 PM

## 2021-01-30 NOTE — TOC Progression Note (Signed)
Transition of Care [facility]) - Progression Note    Patient Details  Name: GILLIAN KLUEVER MRN: [id number] Date of Birth: Oct 22, 1945  [facility]) CM/SW Contact  Beverly Sessions, RN Phone Number: 01/30/2021, 3:40 PM  Clinical Narrative:     Wife at bedside. Met with wife.  She confirms that plan will be for home discharge. She intends to wait for the decision of the appeal  Agreeable to hospital bed, WC, RW, and BSC.  Referral made to [facility] with [employer].   Family to arranged furniture in the home, and anticipated delivery of DME tomorrow  Agreeable to home health services . States she does not have a preference of home health agency.  Referral made to [facility] with [facility]   Expected Discharge Plan: Skilled Nursing Facility Barriers to Discharge: Continued Medical Work up  Expected Discharge Plan and Services Expected Discharge Plan: [place] In-house Referral: Clinical Social Work   Post Acute Care Choice: [place] Living arrangements for the past 2 months: Single Family Home                                       Social Determinants of Health (SDOH) Interventions    Readmission Risk Interventions No flowsheet data found.

## 2021-01-30 NOTE — TOC Progression Note (Signed)
Transition of Care [facility]) - Progression Note    Patient Details  Name: Eric [name] MRN: [id number] Date of Birth: 06-11-1946  Transition of Care [facility]) CM/SW [place], RN Phone Number: 01/30/2021, 3:51 PM  Clinical Narrative:    Spoke with wife at bedside at length regarding appeal including HINN12 and DND. Reviewed process and wife reports she is planning to take patient home with hhc services as she does not want to send him to [place]. States she is having her sons come to the home tonight and prepare the home for the hospital bed.    Expected Discharge Plan: [place] Barriers to Discharge: Continued Medical Work up  Expected Discharge Plan and Services Expected Discharge Plan: [place] In-house Referral: Clinical Social Work   Post Acute Care Choice: [place] Living arrangements for the past 2 months: Single Family Home                                       Social Determinants of Health (SDOH) Interventions    Readmission Risk Interventions No flowsheet data found.

## 2021-01-30 NOTE — Care Management (Signed)
    Durable Medical Equipment  (From admission, onward)           Start     Ordered   01/30/21 1449  For home use only DME 3 n 1  Once        01/30/21 1450   01/30/21 1447  For home use only DME lightweight manual wheelchair with seat cushion  Once       Comments: Patient suffers from dementia which impairs their ability to perform daily activities like bathing, feeding, and toileting in the home.  A walker will not resolve  issue with performing activities of daily living. A wheelchair will allow patient to safely perform daily activities. Patient is not able to propel themselves in the home using a standard weight wheelchair due to general weakness. Patient can self propel in the lightweight wheelchair. Length of need Lifetime. Accessories: elevating leg rests (ELRs), wheel locks, extensions and anti-tippers.   01/30/21 1450   01/30/21 1446  For home use only DME Hospital bed  Once       Question Answer Comment  Length of Need Lifetime   Patient has (list medical condition): right frontal VP shunt placement for hydrocephalus   The above medical condition requires: Patient requires the ability to reposition frequently   Head must be elevated greater than: 45 degrees   Bed type Semi-electric   Hoyer Lift Yes      01/30/21 1450

## 2021-01-30 NOTE — Progress Notes (Signed)
Hypoglycemic Event  CBG: 51 @ 2046  Treatment: 8 oz juice/soda   Symptoms: None  Follow-up CBG: 55 @ 2105  Treatment: Cup of ice cream  Symptoms: None  Follow-Up CBG: 74 @ 212  Possible Reasons for Event: Inadequate meal intake  1 Hour BG Follow Up: 156 @ 2237.   Will continue to monitor.   Kella Splinter

## 2021-01-30 NOTE — TOC Progression Note (Signed)
Transition of Care [facility]) - Progression Note    Patient Details  Name: Eric [name] MRN: [id number] Date of Birth: 29-Oct-1945  Transition of Care [facility]) CM/SW [place], RN Phone Number: 01/30/2021, 11:52 AM  Clinical Narrative:    Wife appealing discharge. DND and HINN 12 completed. Records sent as requested.   Appeal Case id [id number]    Expected Discharge Plan: [place] Barriers to Discharge: Continued Medical Work up  Expected Discharge Plan and Services Expected Discharge Plan: [place] In-house Referral: Clinical Social Work   Post Acute Care Choice: [place] Living arrangements for the past 2 months: Single Family Home                                       Social Determinants of Health (SDOH) Interventions    Readmission Risk Interventions No flowsheet data found.

## 2021-01-30 NOTE — Progress Notes (Signed)
PT Cancellation Note  Patient Details Name: Eric [name] MRN: [id number] DOB: 05/08/1946   Cancelled Treatment:    Reason Eval/Treat Not Completed: Other (comment)  Attempted x 2 this am.  On first attempt pt in bed, had removed gown.  Attempted to replace but he was a bit agitated resisting clothing.  Returned later in AM and pt asleep and did not awaken to gentle verbal and tactile stim.  Will continue at a later time/date.   Chesley Noon 01/30/2021, 10:44 AM

## 2021-01-30 NOTE — Progress Notes (Signed)
Occupational Therapy Treatment Patient Details Name: Eric [name] MRN: [id number] DOB: 03/04/1946 Today's Date: 01/30/2021    History of present illness Patient is a 75 y.o. male with Hydrocephalus status post elective Right Frontal VP Shunt insertion.  Post-op with acute delirium/agitation felt to be due to complication of anesthesia plus Hyponatremia requiring Precedex drip.   OT comments  Eric [name] was seen for OT/PT co-tx this date. Units split accordingly per hospital protocols. Pt received seated EOB with PT/spouse in room. Pt continues to present with limited functional independence to perform ADL management 2/2 impaired cognition, generalized weakness, decreased safety awareness and decreased awareness of deficits. See ADL section below for additional detail regarding occupational performance. OT/PT facilitate functional mobility in room/hallway this dat. OT facilitates seated grooming tasks once back to room and up in recliner. Increased time taken to educate pt wife (at bedside t/o session) on DC recommendations, equipment for home, strategies to support both her own and pt safety upon DC as wife is planning to take pt home upon acute care DC. Pt continues to require +2 assist for safety during functional mobility and STS tasks. He continues to progress toward OT goals and benefits from skilled OT services. CIR remains most appropriate DC Recommendation at this time. However, if family declines CIR/rehab upon acute DC, pt will require HHOT services to maximize his safety and return to functional independence.    Follow Up Recommendations  CIR  Equipment Recommendations  3 in 1 bedside commode;Tub/shower seat    Recommendations for Other Services      Precautions / Restrictions Precautions Precautions: Fall Restrictions Weight Bearing Restrictions: No       Mobility Bed Mobility Overal bed mobility: Needs Assistance Bed Mobility: Supine to Sit     Supine to sit: HOB  elevated;Mod assist     General bed mobility comments: pt required mod assist to exit R side of bed with max vcs and some tactle cues for progression and technique improvements    Transfers Overall transfer level: Needs assistance Equipment used: Rolling walker (2 wheeled);None Transfers: Sit to/from Stand Sit to Stand: Mod assist;From elevated surface;+2 physical assistance        Lateral/Scoot Transfers: Mod assist;+2 physical assistance;Min assist General transfer comment: +2 to stand from bed, RW with min-mod A. Pt progresses to +1 physical assist with chair follow for mobility by end of session.    Balance Overall balance assessment: Needs assistance Sitting-balance support: Feet supported Sitting balance-Leahy Scale: Fair   Postural control: Posterior lean Standing balance support: Bilateral upper extremity supported;During functional activity Standing balance-Leahy Scale: Poor Standing balance comment: need constant assistance throughout for safety. no LOB with BUE support                           ADL either performed or assessed with clinical judgement   ADL Overall ADL's : Needs assistance/impaired     Grooming: Sitting;Wash/dry hands;Oral care;Supervision/safety;Cueing for sequencing;Set up Grooming Details (indicate cue type and reason): Pt completes grooming tasks while seated in room recliner. Requires cueing for sequencint of task, attempts to squeez toothpaste directly into mouth before applying to toothbrush. Pt is easily re-directued with multimodal cueing.                             Functional mobility during ADLs: Moderate assistance;+2 for physical assistance;Cueing for sequencing;Rolling walker General ADL Comments: Mod+2 for mobility progressing to  MOD+1 during session. Pt continues to be limited by cognitive deficits, requires maximial multimodal cueing for safe use of RW during session.     Vision Patient Visual Report: No  change from baseline     Perception     Praxis      Cognition Arousal/Alertness: Lethargic;Suspect due to medications Behavior During Therapy: [facility] for tasks assessed/performed (Pt's cognition is poor. unable to correct posture follow commands consistently.) Overall Cognitive Status: Impaired/Different from baseline                       Memory: Decreased recall of precautions;Decreased short-term memory Following Commands: Follows one step commands inconsistently Safety/Judgement: Decreased awareness of safety;Decreased awareness of deficits   Problem Solving: Slow processing;Decreased initiation;Difficulty sequencing;Requires verbal cues;Requires tactile cues General Comments: Pt mildly impulsive t/o session. Redirectable to task at hand with multimodal cueing.        Exercises Other Exercises Other Exercises: OT/PT facilitate functional mobility in room/hallway this dat. OT facilitates seated grooming tasks once back to room and up in recliner. Increased time taken to educate pt wife (at bedside t/o session) on DC recommendations, equipment for home, strategies to support both her own and pt safety upon DC as wife is planning to take pt home upon acute care DC.   Shoulder Instructions       General Comments      Pertinent Vitals/ Pain       Pain Assessment: No/denies pain Pain Score: 0-No pain Faces Pain Scale: No hurt  Home Living                                          Prior Functioning/Environment              Frequency  Min 2X/week        Progress Toward Goals  OT Goals(current goals can now be found in the care plan section)  Progress towards OT goals: Progressing toward goals  Acute Rehab OT Goals Patient Stated Goal: To go home OT Goal Formulation: With patient/family Time For Goal Achievement: 02/13/21 Potential to Achieve Goals: Good  Plan Discharge plan remains appropriate;Frequency remains appropriate     Co-evaluation    PT/OT/SLP Co-Evaluation/Treatment: Yes Reason for Co-Treatment: Complexity of the patient's impairments (multi-system involvement);Necessary to address cognition/behavior during functional activity;For patient/therapist safety;To address functional/ADL transfers PT goals addressed during session: Mobility/safety with mobility;Strengthening/ROM;Proper use of DME;Balance;Other (comment) (family education/equipment recommendations) OT goals addressed during session: ADL's and self-care;Proper use of Adaptive equipment and DME      AM-PAC OT "6 Clicks" Daily Activity     Outcome Measure   Help from another person eating meals?: A Little Help from another person taking care of personal grooming?: A Little Help from another person toileting, which includes using toliet, bedpan, or urinal?: A Lot Help from another person bathing (including washing, rinsing, drying)?: A Lot Help from another person to put on and taking off regular upper body clothing?: A Little Help from another person to put on and taking off regular lower body clothing?: A Lot 6 Click Score: 15    End of Session Equipment Utilized During Treatment: Rolling walker;Gait belt  OT Visit Diagnosis: Unsteadiness on feet (R26.81);Muscle weakness (generalized) (M62.81);Other symptoms and signs involving cognitive function   Activity Tolerance Patient tolerated treatment well   Patient Left with call bell/phone within reach;in chair;with chair  alarm set;with family/visitor present   Nurse Communication Other (comment) (DC recs)        [id number] OT Time Calculation (min): 38 min  Charges: OT General Charges $OT Visit: 1 Visit OT Treatments $Therapeutic Activity: 8-22 mins  Shara Blazing, M.S., OTR/L Ascom: [phone] 01/30/21, 3:25 PM

## 2021-01-30 NOTE — Progress Notes (Signed)
Neurosurgery Daily Note  Progress Note  History: Eric [name] is POD#22 from right frontal VP shunt placement for hydrocephalus. He has dementia at baseline which was exacerbated by anesthesia from his recent surgery. He has suffered from an extended postoperative delirium.   Physical Exam: Vitals:   01/30/21 0813 01/30/21 0833  BP: 129/66 115/86  Pulse: 93 86  Resp: 18 20  Temp: 98.2 F (36.8 C) 97.7 F (36.5 C)  SpO2: 97% 98%    Resting comfortably but easily aroused. Oriented to self.  Follows commands x4 All 3 incisions healing well Abdomen soft  Data:  Recent Labs  Lab 01/25/21 0457  NA 134*  K 4.5  CL 100  CO2 28  BUN 39*  CREATININE 1.17  GLUCOSE 86  CALCIUM 9.2    No results for input(s): AST, ALT, ALKPHOS in the last 168 hours.  Invalid input(s): TBILI    Recent Labs  Lab 01/25/21 0457  WBC 10.9*  HGB 14.1  HCT 40.3  PLT 456*    No results for input(s): APTT, INR in the last 168 hours.       Other tests/results: Shunt series completed post-op without noted complication.   CT head 01/10/21 showed good placement of catheter without any hemorrhage  Assessment/Plan:  Eric [name]  is a 75 y.o male s/p right frontal VP shunt. He is improving from post-operative delirium.  - DVT prophylaxis (On Heparin 5,000 q8) - PTOT when able - Appreciate medicine's assistance with management  - Discuss with neurology any treatment for cognitive disorders - Dispo planning is underway  Cooper Render PA-C Neurosurgery

## 2021-01-31 LAB — GLUCOSE, CAPILLARY
Glucose-Capillary: 54 mg/dL — ABNORMAL LOW (ref 70–99)
Glucose-Capillary: 72 mg/dL (ref 70–99)
Glucose-Capillary: 168 mg/dL — ABNORMAL HIGH (ref 70–99)

## 2021-01-31 MED ORDER — INSULIN GLARGINE-YFGN 100 UNIT/ML ~~LOC~~ SOLN: 30.0000 [IU] | Freq: Every day | SUBCUTANEOUS | 0 refills | Status: AC

## 2021-01-31 MED ORDER — SENNA 8.6 MG PO TABS: 1.0000 | ORAL_TABLET | Freq: Two times a day (BID) | ORAL | 0 refills | Status: AC

## 2021-01-31 MED ORDER — BISACODYL 5 MG PO TBEC: 5.0000 mg | DELAYED_RELEASE_TABLET | Freq: Every day | ORAL | 0 refills | Status: AC | PRN

## 2021-01-31 MED ORDER — QUETIAPINE FUMARATE 25 MG PO TABS: 25.0000 mg | ORAL_TABLET | Freq: Every morning | ORAL | 0 refills | Status: AC

## 2021-01-31 MED ORDER — QUETIAPINE FUMARATE 25 MG PO TABS: 75.0000 mg | ORAL_TABLET | Freq: Every day | ORAL | 0 refills | Status: AC

## 2021-01-31 MED ORDER — GUAIFENESIN ER 600 MG PO TB12: 1200.0000 mg | ORAL_TABLET | Freq: Two times a day (BID) | ORAL | 0 refills | Status: AC

## 2021-01-31 MED ORDER — THIAMINE HCL 100 MG PO TABS: 100.0000 mg | ORAL_TABLET | Freq: Every day | ORAL | 0 refills | Status: AC

## 2021-01-31 MED ORDER — IPRATROPIUM-ALBUTEROL 0.5-2.5 (3) MG/3ML IN SOLN: 3.0000 mL | Freq: Four times a day (QID) | RESPIRATORY_TRACT | 0 refills | Status: AC | PRN

## 2021-01-31 MED ORDER — NEPRO/CARBSTEADY PO LIQD: 237.0000 mL | Freq: Three times a day (TID) | ORAL | 0 refills | Status: AC

## 2021-01-31 MED ORDER — PANTOPRAZOLE SODIUM 40 MG PO TBEC: 40.0000 mg | DELAYED_RELEASE_TABLET | Freq: Every day | ORAL | 0 refills | Status: AC

## 2021-01-31 NOTE — Progress Notes (Signed)
SLP Cancellation Note  Patient Details Name: Eric [name] MRN: [id number] DOB: [birth year]   Cancelled treatment:       Reason Eval/Treat Not Completed: Other (comment)  ST re-consulted for education in preparation for discharge. I made 3 attempts to visit with pt's wife today but she wasn't available. I reached her via her home phone number. Education was provided on ways to prepare dysphagia 1 food items as well as thin liquids. Examples provided and she asked very good questions. At the end of our conversation she didn't have any further questions regarding pt's diet.  I also provided education on referral for HHST services with plan for [facility] to upgrade diet as appropriate once pt has transitioned home.   She stated she was ready to get pt home and commented that DME was currently being delivered at the time of our conversation.   45 minutes of non-billable time spent  Lankford Gutzmer B. Rutherford Nail M.S., CCC-SLP, [place] [facility] [phone]  Stormy Fabian 01/31/2021, 12:31 PM

## 2021-01-31 NOTE — Progress Notes (Signed)
AVS reviewed with pt's wife. Wife denies any concerns at this time.

## 2021-01-31 NOTE — Progress Notes (Signed)
Inpatient Diabetes Program Recommendations  AACE/ADA: New Consensus Statement on Inpatient Glycemic Control (2015)  Target Ranges:  Prepandial:   less than 140 mg/dL      Peak postprandial:   less than 180 mg/dL (1-2 hours)      Critically ill patients:  140 - 180 mg/dL   Lab Results  Component Value Date   GLUCAP 168 (H) 01/31/2021   HGBA1C 6.8 (H) 01/09/2021    Review of Glycemic Control Results for ELIAH, NEYLAND (MRN [id number]) as of 01/31/2021 15:34  Ref. Range 01/30/2021 21:27 01/30/2021 22:37 01/31/2021 07:38 01/31/2021 08:02 01/31/2021 11:53  Glucose-Capillary Latest Ref Range: 70 - 99 mg/dL 74 156 (H) 54 (L) 72 168 (H)   Diabetes history: DM Outpatient Diabetes medications: None Current orders for Inpatient glycemic control:  Semglee 30 units daily, Novolog 3 units tid with meals, Novolog moderate tid with meals and HS  Inpatient Diabetes Program Recommendations:    Note patient has had hypoglycemia in hospital.  He was not on medications for DM prior to admit and A1C=6.8%.  Do not recommend sending patient home on Semglee. Recommend having wife monitor 2 times a day and f/u with PCP.   Discussed with RN.  Patient is being d/c'd right now to home.  She states she will discuss with patient's wife.  Message sent to MD so that insulin can be addressed with family and possibly d/c'd from discharge medications.    Thanks,  Adah Perl, RN, BC-ADM Inpatient Diabetes Coordinator Pager [phone]  (8a-5p)

## 2021-01-31 NOTE — Plan of Care (Signed)

## 2021-01-31 NOTE — Progress Notes (Signed)
Occupational Therapy Treatment Patient Details Name: Eric [name] MRN: [id number] DOB: [birth year] Today's Date: 01/31/2021    History of present illness Patient is a 75 y.o. male with Hydrocephalus status post elective Right Frontal VP Shunt insertion.  Post-op with acute delirium/agitation felt to be due to complication of anesthesia plus Hyponatremia requiring Precedex drip.   OT comments  Plan for d/c home this date despite OT/PT recommendations to d/c to SNF/CIR. No family present or available for caregiver training session. Upon arrival pt found with BLE over guard rails and nude. Pt requires MOD A don gown at bed level. SETUP + MIN A for seated tooth brushing - assist for posterior lean and initiation/termination of task. MOD A + HHA sit<>stand c premature initiation of sitting. Continue to recommend 24/7 supervision in CIR or STR setting as pt requires significant physical assist and sequencing for tasks.    Follow Up Recommendations  CIR; 24/7 Supervision   Equipment Recommendations  3 in 1 bedside commode;Tub/shower seat    Recommendations for Other Services      Precautions / Restrictions Precautions Precautions: Fall Restrictions Weight Bearing Restrictions: No       Mobility Bed Mobility Overal bed mobility: Needs Assistance Bed Mobility: Supine to Sit;Sit to Supine     Supine to sit: HOB elevated;Mod assist Sit to supine: Min assist;HOB elevated      Transfers Overall transfer level: Needs assistance Equipment used: Rolling walker (2 wheeled) Transfers: Sit to/from Stand Sit to Stand: Mod assist         General transfer comment: poor insight, initiates sitting prematurely    Balance Overall balance assessment: Needs assistance Sitting-balance support: Feet supported Sitting balance-Leahy Scale: Fair     Standing balance support: Bilateral upper extremity supported;During functional activity Standing balance-Leahy Scale: Poor Standing balance  comment: impulsive                           ADL either performed or assessed with clinical judgement   ADL Overall ADL's : Needs assistance/impaired                                       General ADL Comments: MOD A for ADL t/f. SETUP + MIN A for seated tooth brushing - assist for posterior lean and initiation/termination of task. MOD A don gown at bed level      Cognition Arousal/Alertness: Awake/alert Behavior During Therapy: Restless Overall Cognitive Status: Impaired/Different from baseline Area of Impairment: Following commands;Safety/judgement;Awareness;Orientation                 Orientation Level: Disoriented to;Time;Situation Current Attention Level: Alternating Memory: Decreased recall of precautions;Decreased short-term memory Following Commands: Follows one step commands inconsistently Safety/Judgement: Decreased awareness of safety;Decreased awareness of deficits   Problem Solving: Slow processing;Decreased initiation;Difficulty sequencing;Requires verbal cues;Requires tactile cues General Comments: Pt was nude in bed with BLEs hanging out of bed upon arriving. he cotinues to be confused but is alert and able to follow some commands. poor insight of safety concerns with overall extremely poor safety awareness        Exercises Exercises: Other exercises Other Exercises Other Exercises: No family memebr available for education, pt demos poor carryover Other Exercises: LBD, UBD, grooming, sup<>sit, sit<>stand, sitting/standing balance/toelrance           Pertinent Vitals/ Pain       Pain  Assessment: No/denies pain Pain Score: 0-No pain         Frequency  Min 2X/week        Progress Toward Goals  OT Goals(current goals can now be found in the care plan section)  Progress towards OT goals: Progressing toward goals  Acute Rehab OT Goals Patient Stated Goal: To go home OT Goal Formulation: With patient/family Time For  Goal Achievement: 02/13/21 Potential to Achieve Goals: Good ADL Goals Pt Will Perform Grooming: with set-up;with supervision;sitting Pt Will Perform Lower Body Dressing: with min assist;sit to/from stand Pt Will Transfer to Toilet: with min assist;bedside commode;ambulating Pt Will Perform Toileting - Clothing Manipulation and hygiene: with supervision;sit to/from stand Pt/caregiver will Perform Home Exercise Program: Increased strength;Both right and left upper extremity;With minimal assist  Plan Discharge plan remains appropriate;Frequency remains appropriate       AM-PAC OT "6 Clicks" Daily Activity     Outcome Measure   Help from another person eating meals?: A Little Help from another person taking care of personal grooming?: A Little Help from another person toileting, which includes using toliet, bedpan, or urinal?: A Lot Help from another person bathing (including washing, rinsing, drying)?: A Lot Help from another person to put on and taking off regular upper body clothing?: A Little Help from another person to put on and taking off regular lower body clothing?: A Lot 6 Click Score: 15    End of Session    OT Visit Diagnosis: Unsteadiness on feet (R26.81);Muscle weakness (generalized) (M62.81);Other symptoms and signs involving cognitive function   Activity Tolerance Patient tolerated treatment well   Patient Left in bed;with call bell/phone within reach;with bed alarm set           Time: [id number] OT Time Calculation (min): 10 min  Charges: OT General Charges $OT Visit: 1 Visit OT Treatments $Self Care/Home Management : 8-22 mins  Dessie Coma, M.S. OTR/L  01/31/21, 3:46 PM  ascom [phone]

## 2021-01-31 NOTE — TOC Transition Note (Signed)
Transition of Care [facility]) - CM/SW Discharge Note   Patient Details  Name: ABAAN SCHURMAN MRN: [id number] Date of Birth: 1945-10-12  [facility]) CM/SW Contact:  Beverly Sessions, RN Phone Number: 01/31/2021, 1:10 PM   Clinical Narrative:      Notified by Carma Lair MD agrees with discharge Wife notified and in agreement for discharge.  Wife confirms that all DME has been delivered to home  Patient will DC [id number] with home health through [place] Anticipated DC date:Today Family notified:Peggy Transport [id number] choice   Ambulance transport requested for patient for 3 pm TOC signing off.  Isaias Cowman [facility] [phone]   Final next level of care: Home w Home Health Services Barriers to Discharge: Barriers Resolved   Patient Goals and CMS Choice Patient states their goals for this hospitalization and ongoing recovery are:: for pt to go to snf   Choice offered to / list presented to : Spouse  Discharge Placement                  Name of family member notified: Peggy    Discharge Plan and Services In-house Referral: Clinical Social Work   Post Acute Care Choice: [place]          DME Arranged: Hospital bed, Environmental consultant, Youth worker wheelchair with seat cushion, 3-N-1 DME Agency: [employer] Date DME Agency Contacted: 01/31/21   Representative spoke with at DME Agency: Faythe Dingwall HH Arranged: PT, OT, Speech Therapy, Nurse's Aide [place] Agency: [place] ([place]) Date HH Agency Contacted: 01/31/21   Representative spoke with at [place]: [place] ([place]) Interventions     Readmission Risk Interventions No flowsheet data found.

## 2021-01-31 NOTE — TOC Progression Note (Addendum)
Transition of Care [facility]) - Progression Note    Patient Details  Name: Eric [name] MRN: [id number] Date of Birth: June 10, 1946  Transition of Care [facility]) CM/SW Contact  Beverly Sessions, RN Phone Number: 01/31/2021, 9:26 AM  Clinical Narrative:     RW referral made to [facility] with [facility] still under clinical review [facility] unable to accept referral, referral made to [facility] with [place] to review   Expected Discharge Plan: [place] Barriers to Discharge: Continued Medical Work up  Expected Discharge Plan and Services Expected Discharge Plan: [place] In-house Referral: Clinical Social Work   Post Acute Care Choice: [place] Living arrangements for the past 2 months: Single Family Home                                       Social Determinants of Health (SDOH) Interventions    Readmission Risk Interventions No flowsheet data found.

## 2021-01-31 NOTE — Discharge Instructions (Signed)
NEUROSURGERY DISCHARGE INSTRUCTIONS  Admission diagnosis: Hydrocephalus ([place]) [G91.9]  Operative procedure: Placement of VP shunt   What to do after you leave the hospital:  Recommended diet:  Per speech recommendations . Increase protein intake to promote wound healing.  Recommended activity: activity as tolerated.    Please Report any of the following: Nausea or Vomiting, Temperature is greater than 101.24F (38.1C) degrees, Dizziness, Abdominal Pain, Difficulty Breathing or Shortness of Breath, Inability to Eat, drink Fluids, or Take medications, Bleeding, swelling, or drainage from surgical incision sites, New numbness or weakness, and Bowel or bladder dysfunction to the neurosurgeon on call at [phone]  Additional Follow up appointments Please follow up with  Dr.Cook in [place] [facility] as scheduled in 2-3 weeks   Please see below for scheduled appointments:  No future appointments.

## 2021-01-31 NOTE — Progress Notes (Signed)
Physical Therapy Treatment Patient Details Name: Eric [name] MRN: [id number] DOB: January 14, 1946 Today's Date: 01/31/2021    History of Present Illness Patient is a 75 y.o. male with Hydrocephalus status post elective Right Frontal VP Shunt insertion.  Post-op with acute delirium/agitation felt to be due to complication of anesthesia plus Hyponatremia requiring Precedex drip.    PT Comments    Pt was supine, in the nude, upon arriving. He was attempting to climb out of bed 2/2 to needing to use BR. Author assisted pt OOB and to BR. He had successful BM prior to standing and ambulating back to bed. Pt is extremely high fall risk due to poor cognition and overall safety awareness. He is unable to follow cues consistently. Heavy use of gait belt throughout standing for safety. Overall pt continues to be rehab appropriate but family unwilling. If pt/family elects to DC home, recommend [facility] services.     Follow Up Recommendations  CIR;Supervision/Assistance - 24 hour (CIR/SNF recommended however spouse plans to take pt home with [facility] services)     Equipment Recommendations  Rolling walker with 5" wheels;Wheelchair (measurements PT);Hospital bed;Wheelchair cushion (measurements PT);3in1 (PT)       Precautions / Restrictions Precautions Precautions: Fall Restrictions Weight Bearing Restrictions: No    Mobility  Bed Mobility Overal bed mobility: Needs Assistance Bed Mobility: Supine to Sit;Sit to Supine     Supine to sit: HOB elevated;Mod assist Sit to supine: Min assist;HOB elevated   General bed mobility comments: Pt continues to require assistance with getting out of bed and back into bed after OOB activity    Transfers Overall transfer level: Needs assistance Equipment used: Rolling walker (2 wheeled) Transfers: Sit to/from Stand Sit to Stand: Min assist;Mod assist;From elevated surface         General transfer comment: Pt somewhat impulsively attempts to stand even without RW in  place. Poor insight to deficits and safety  Ambulation/Gait Ambulation/Gait assistance: Min assist;Mod assist Gait Distance (Feet): 20 Feet Assistive device: Rolling walker (2 wheeled) Gait Pattern/deviations: Decreased stride length;Trunk flexed;Shuffle;Narrow base of support Gait velocity: decreased   General Gait Details: pt is able to ambulate 2 x 20 ft to BR. extremely poor gait quality and safety. heavy use of gait belt for safety    Balance Overall balance assessment: Needs assistance Sitting-balance support: Feet supported Sitting balance-Leahy Scale: Fair     Standing balance support: Bilateral upper extremity supported;During functional activity Standing balance-Leahy Scale: Poor Standing balance comment: constant vcs and assistance to prevent LOB due to impulsivity and overall poor cognition/ safety awareness      Cognition Arousal/Alertness: Awake/alert Behavior During Therapy: Impulsive;Restless Overall Cognitive Status: Impaired/Different from baseline Area of Impairment: Following commands;Safety/judgement;Awareness;Orientation      Orientation Level: Disoriented to;Time;Situation Current Attention Level: Alternating Memory: Decreased recall of precautions;Decreased short-term memory Following Commands: Follows one step commands inconsistently Safety/Judgement: Decreased awareness of safety;Decreased awareness of deficits   Problem Solving: Slow processing;Decreased initiation;Difficulty sequencing;Requires verbal cues;Requires tactile cues General Comments: Pt was nude in bed with BLEs hanging out of bed upon arriving. he cotinues to be confused but is alert and able to follow some commands. poor insight of safety concerns with overall extremely poor safety awareness             Pertinent Vitals/Pain Pain Assessment: No/denies pain Pain Score: 0-No pain     PT Goals (current goals can now be found in the care plan section) Acute Rehab PT Goals Patient  Stated Goal: To go  home Progress towards PT goals: Progressing toward goals    Frequency    7X/week      PT Plan Current plan remains appropriate    Co-evaluation     PT goals addressed during session: Mobility/safety with mobility;Proper use of DME;Balance;Strengthening/ROM        AM-PAC PT "6 Clicks" Mobility   Outcome Measure  Help needed turning from your back to your side while in a flat bed without using bedrails?: A Little Help needed moving from lying on your back to sitting on the side of a flat bed without using bedrails?: A Lot Help needed moving to and from a bed to a chair (including a wheelchair)?: A Lot Help needed standing up from a chair using your arms (e.g., wheelchair or bedside chair)?: A Lot Help needed to walk in hospital room?: A Lot Help needed climbing 3-5 steps with a railing? : A Lot 6 Click Score: 13    End of Session Equipment Utilized During Treatment: Gait belt Activity Tolerance: Patient tolerated treatment well Patient left: in bed;with call bell/phone within reach;with bed alarm set Nurse Communication: Mobility status;Precautions PT Visit Diagnosis: Unsteadiness on feet (R26.81);Muscle weakness (generalized) (M62.81);Other abnormalities of gait and mobility (R26.89)     Time: 1105-1130 PT Time Calculation (min) (ACUTE ONLY): 25 min  Charges:  $Gait Training: 8-22 mins $Therapeutic Activity: 8-22 mins                     Julaine Fusi PTA 01/31/21, 1:09 PM

## 2021-01-31 NOTE — Progress Notes (Signed)
Neurosurgery Daily Note  Progress Note  History: Eric [name] is POD#23 from right frontal VP shunt placement for hydrocephalus. He has dementia at baseline which was exacerbated by anesthesia from his recent surgery. He has suffered from an extended postoperative delirium.   Physical Exam: Vitals:   01/31/21 0800 01/31/21 0828  BP: 101/62 107/64  Pulse: 84 88  Resp: 16 18  Temp: 98.3 F (36.8 C) 98.2 F (36.8 C)  SpO2: 99% 100%    Sitting up him bed eating. Oriented to person and place. Follows commands All 3 incisions healing well Abdomen soft  Data:  Recent Labs  Lab 01/25/21 0457  NA 134*  K 4.5  CL 100  CO2 28  BUN 39*  CREATININE 1.17  GLUCOSE 86  CALCIUM 9.2    No results for input(s): AST, ALT, ALKPHOS in the last 168 hours.  Invalid input(s): TBILI    Recent Labs  Lab 01/25/21 0457  WBC 10.9*  HGB 14.1  HCT 40.3  PLT 456*    No results for input(s): APTT, INR in the last 168 hours.       Other tests/results: Shunt series completed post-op without noted complication.   CT head 01/10/21 showed good placement of catheter without any hemorrhage  Assessment/Plan:  Eric [name]  is a 75 y.o male s/p right frontal VP shunt. He is improving from post-operative delirium.  - DVT prophylaxis (On Heparin 5,000 q8) - PTOT - Appreciate medicine's assistance with management  - Discuss with neurology any treatment for cognitive disorders - Dispo planning is underway  Cooper Render PA-C Neurosurgery

## 2021-02-02 LAB — MISC LABCORP TEST (SEND OUT): Labcorp test code: 9985

## 2021-03-17 DEATH — deceased

## 2022-07-30 IMAGING — CT CT HEAD W/O CM
3 series · 15 of 47 positions shown, 18 images · non-contrast
Comparison: Brain MRI 11/17/2020 and earlier.

CLINICAL DATA: 75-year-old male with progressive gait dysfunction.
History of tectal lipoma, now status post CSF shunt. Delirium.

EXAM:
CT HEAD WITHOUT CONTRAST
TECHNIQUE: Contiguous axial images were obtained from the base of the skull
through the vertex without intravenous contrast.

[Series 3: head wo · axial · 0.45mm/px · z∈[-100,+45]mm · 9 of 35 slices shown, 12 images]
[im 3/35  brain]
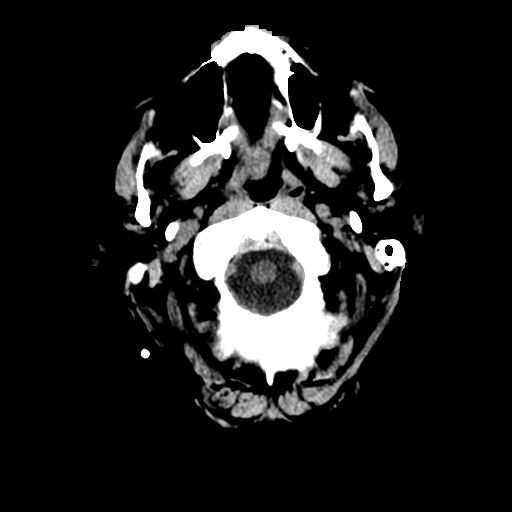
[im 3/35  bone]
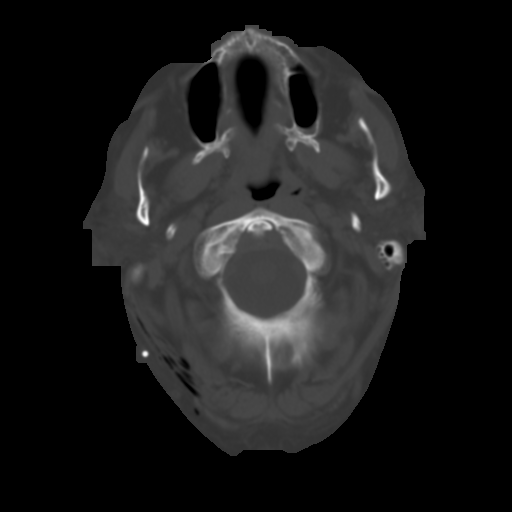
[im 6/35  brain]
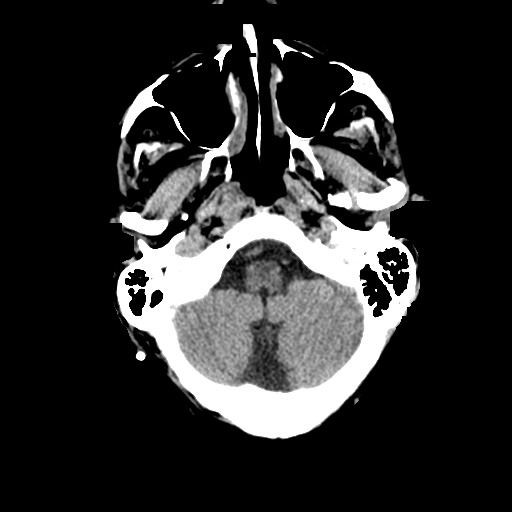
[im 10/35  brain]
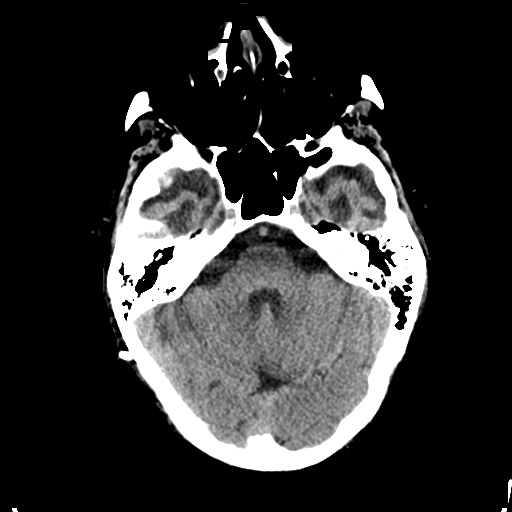
[im 13/35  brain]
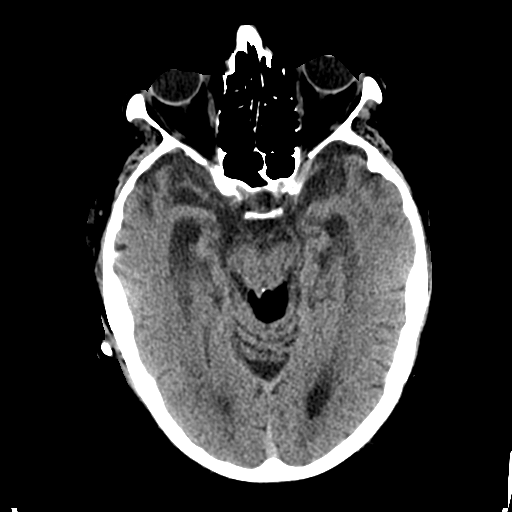
[im 18/35  brain]
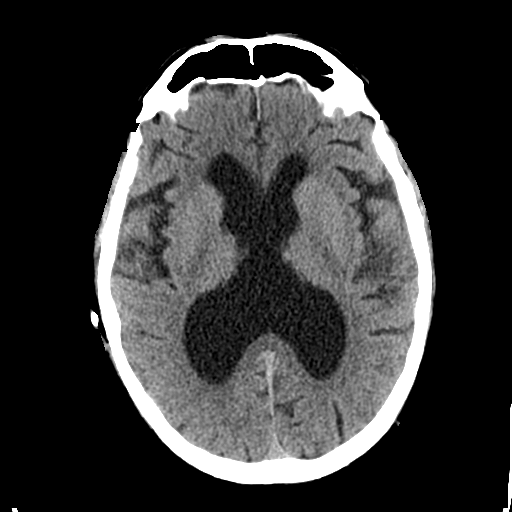
[im 18/35  bone]
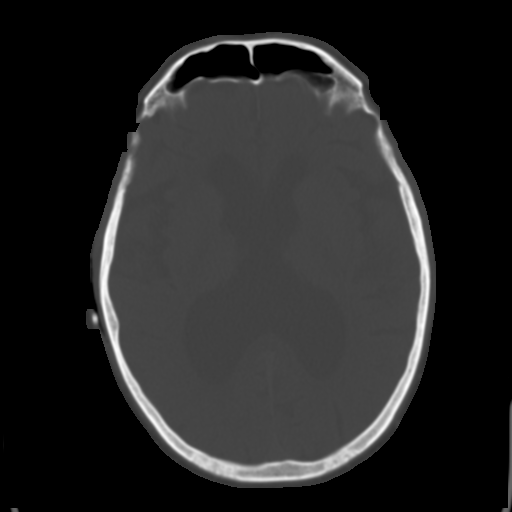
[im 22/35  brain]
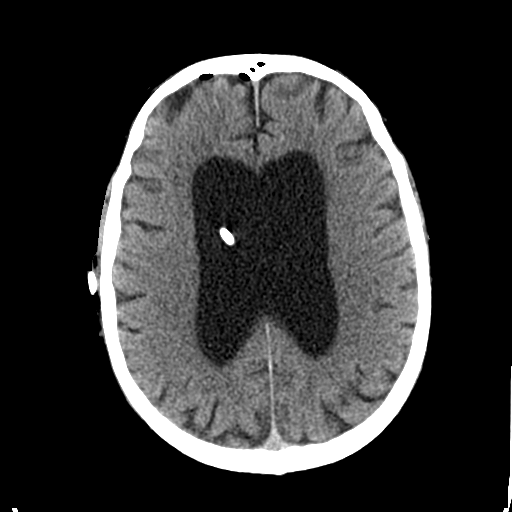
[im 25/35  brain]
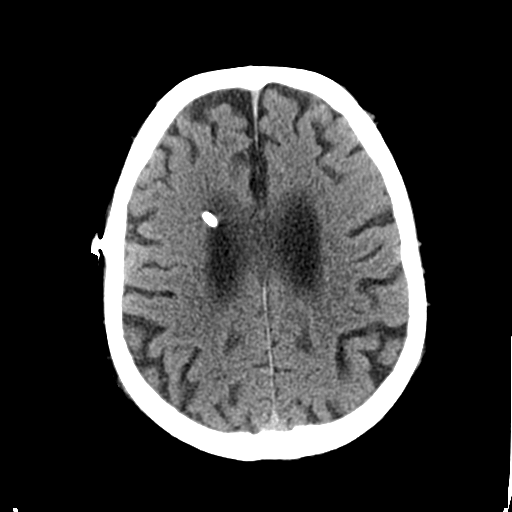
[im 29/35  brain]
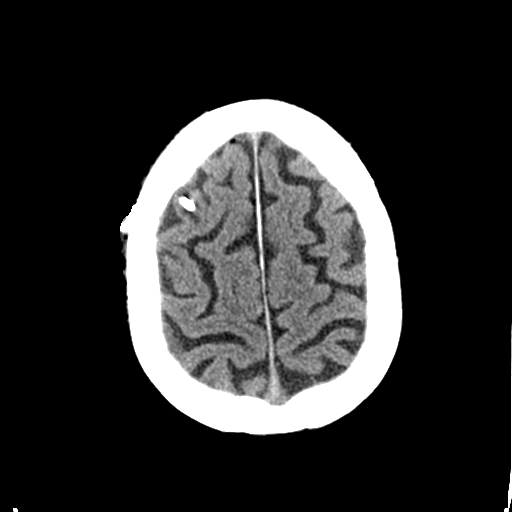
[im 32/35  brain]
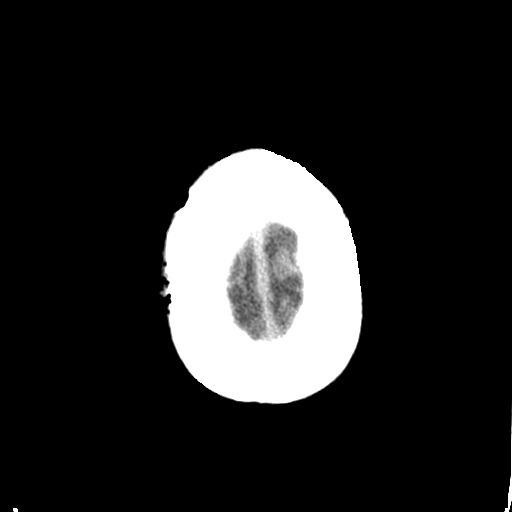
[im 32/35  bone]
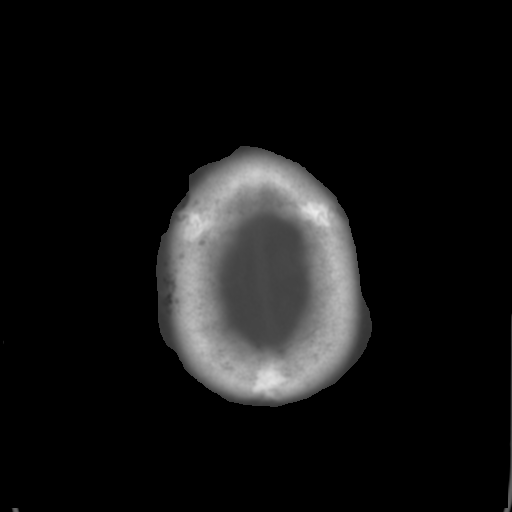

[Series 4: coronal soft tissue · coronal · 0.35mm/px · 3 of 75 slices shown]
[im 25/75  brain]
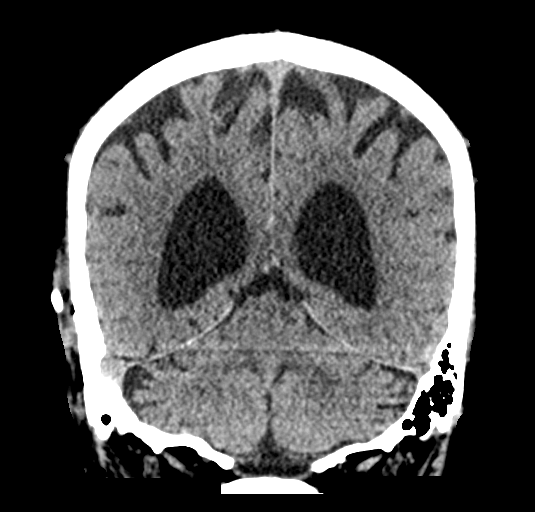
[im 33/75  brain]
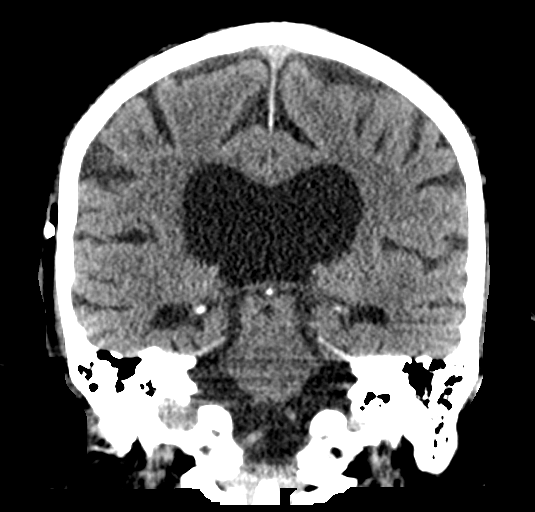
[im 42/75  brain]
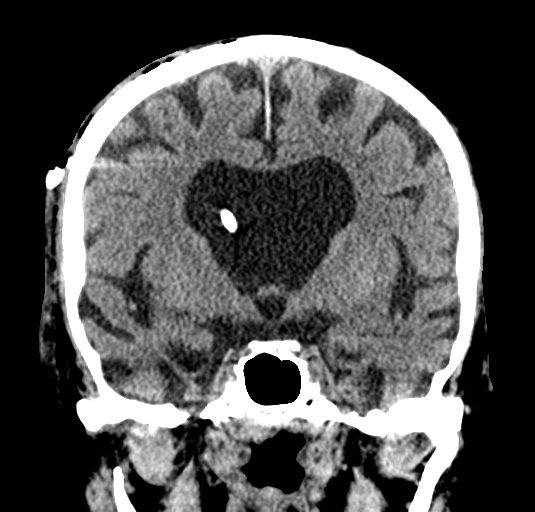

[Series 5: sagittal soft tissue · sagittal · 0.35mm/px · 3 of 63 slices shown]
[im 21/63  brain]
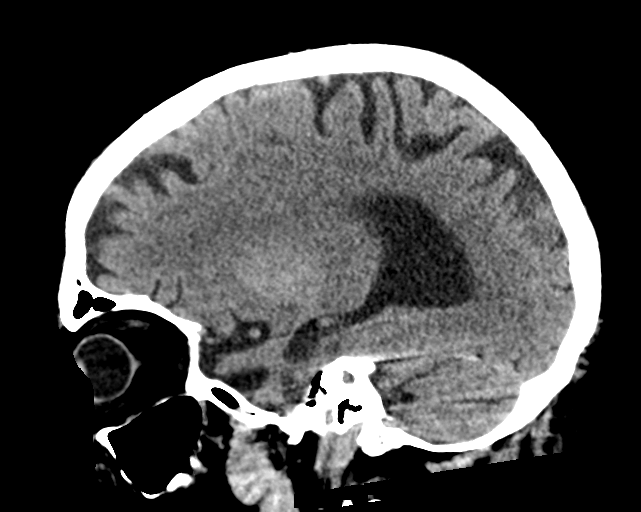
[im 32/63  brain]
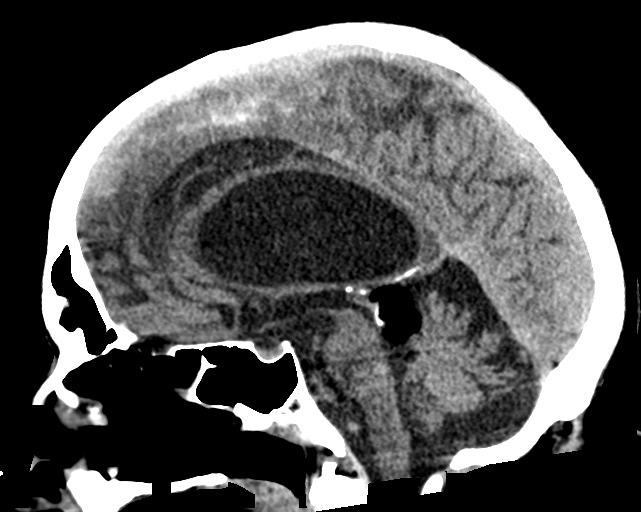
[im 42/63  brain]
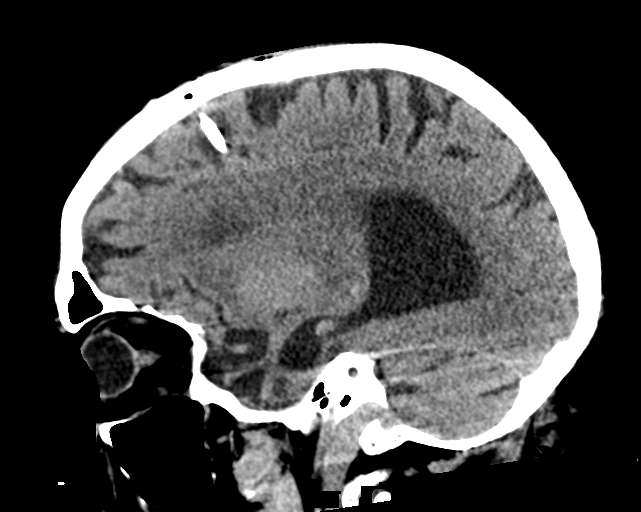

[15 of 47 positions shown; findings below may reference images not displayed]

FINDINGS: Brain: Right superior frontal approach CSF shunt now in place. The
catheter terminates in the body of the right lateral ventricle with
no adverse features.

Ventricle size and configuration not significantly changed since
10/24/2020. No transependymal edema. Tectal plate low-density lipoma
in the midline redemonstrated.

No superimposed midline shift, intracranial hemorrhage or evidence
of cortically based acute infarction. Gray-white matter
differentiation is stable and within normal limits.

Vascular: Mild Calcified atherosclerosis at the skull base.

Skull: Right vertex burr hole related to CSF shunt. Otherwise
stable, negative.

Sinuses/Orbits: Visualized paranasal sinuses and mastoids are clear.

Other: Mild postoperative changes to the right vertex and right
lateral scalp from shunt placement. Scalp reservoir in place with no
adverse features identified. Small volume of postoperative gas
tracks to the right suboccipital space, upper neck. Visualized orbit
soft tissues are within normal limits.
IMPRESSION: 1. Right superior approach CSF shunt placed with no adverse
features. Ventricular size and configuration not significantly
changed since [DATE]. Stable midline tectal lipoma.  No new intracranial abnormality.
